# Patient Record
Sex: Male | Born: 1955
Health system: Southern US, Community
[De-identification: ages and names within clinical notes are randomized; demographics above are authoritative.]

## PROBLEM LIST (undated history)

## (undated) DIAGNOSIS — R911 Solitary pulmonary nodule: Secondary | ICD-10-CM

## (undated) DIAGNOSIS — Z7902 Long term (current) use of antithrombotics/antiplatelets: Secondary | ICD-10-CM

## (undated) DIAGNOSIS — R9431 Abnormal electrocardiogram [ECG] [EKG]: Secondary | ICD-10-CM

## (undated) DIAGNOSIS — K4020 Bilateral inguinal hernia, without obstruction or gangrene, not specified as recurrent: Secondary | ICD-10-CM

## (undated) DIAGNOSIS — R6510 Systemic inflammatory response syndrome (SIRS) of non-infectious origin without acute organ dysfunction: Secondary | ICD-10-CM

## (undated) DIAGNOSIS — Z7982 Long term (current) use of aspirin: Secondary | ICD-10-CM

## (undated) DIAGNOSIS — I499 Cardiac arrhythmia, unspecified: Secondary | ICD-10-CM

## (undated) DIAGNOSIS — D126 Benign neoplasm of colon, unspecified: Secondary | ICD-10-CM

## (undated) DIAGNOSIS — M1990 Unspecified osteoarthritis, unspecified site: Secondary | ICD-10-CM

## (undated) DIAGNOSIS — R0600 Dyspnea, unspecified: Secondary | ICD-10-CM

## (undated) DIAGNOSIS — Z87891 Personal history of nicotine dependence: Secondary | ICD-10-CM

## (undated) DIAGNOSIS — A419 Sepsis, unspecified organism: Secondary | ICD-10-CM

## (undated) DIAGNOSIS — K635 Polyp of colon: Secondary | ICD-10-CM

## (undated) DIAGNOSIS — J431 Panlobular emphysema: Secondary | ICD-10-CM

## (undated) DIAGNOSIS — K219 Gastro-esophageal reflux disease without esophagitis: Secondary | ICD-10-CM

## (undated) DIAGNOSIS — K645 Perianal venous thrombosis: Secondary | ICD-10-CM

## (undated) DIAGNOSIS — R05 Cough: Secondary | ICD-10-CM

## (undated) DIAGNOSIS — J42 Unspecified chronic bronchitis: Secondary | ICD-10-CM

## (undated) DIAGNOSIS — M170 Bilateral primary osteoarthritis of knee: Secondary | ICD-10-CM

## (undated) DIAGNOSIS — M778 Other enthesopathies, not elsewhere classified: Secondary | ICD-10-CM

## (undated) DIAGNOSIS — J45909 Unspecified asthma, uncomplicated: Secondary | ICD-10-CM

## (undated) DIAGNOSIS — R0609 Other forms of dyspnea: Secondary | ICD-10-CM

## (undated) DIAGNOSIS — Z955 Presence of coronary angioplasty implant and graft: Secondary | ICD-10-CM

## (undated) DIAGNOSIS — R7989 Other specified abnormal findings of blood chemistry: Secondary | ICD-10-CM

## (undated) DIAGNOSIS — Z8619 Personal history of other infectious and parasitic diseases: Secondary | ICD-10-CM

## (undated) DIAGNOSIS — M5030 Other cervical disc degeneration, unspecified cervical region: Secondary | ICD-10-CM

## (undated) DIAGNOSIS — Z720 Tobacco use: Secondary | ICD-10-CM

## (undated) DIAGNOSIS — R053 Chronic cough: Secondary | ICD-10-CM

## (undated) DIAGNOSIS — I10 Essential (primary) hypertension: Secondary | ICD-10-CM

## (undated) DIAGNOSIS — J449 Chronic obstructive pulmonary disease, unspecified: Secondary | ICD-10-CM

## (undated) DIAGNOSIS — E785 Hyperlipidemia, unspecified: Secondary | ICD-10-CM

## (undated) DIAGNOSIS — I2510 Atherosclerotic heart disease of native coronary artery without angina pectoris: Secondary | ICD-10-CM

## (undated) HISTORY — DX: Essential (primary) hypertension: I10

## (undated) HISTORY — DX: Systemic inflammatory response syndrome (sirs) of non-infectious origin without acute organ dysfunction: R65.10

## (undated) HISTORY — PX: HERNIA REPAIR: SHX51

## (undated) HISTORY — DX: Other enthesopathies, not elsewhere classified: M77.8

## (undated) HISTORY — DX: Abnormal electrocardiogram (ECG) (EKG): R94.31

## (undated) HISTORY — DX: Perianal venous thrombosis: K64.5

## (undated) HISTORY — PX: HEMORRHOID SURGERY: SHX153

## (undated) HISTORY — DX: Cough: R05

## (undated) HISTORY — DX: Atherosclerotic heart disease of native coronary artery without angina pectoris: I25.10

## (undated) HISTORY — DX: Personal history of other infectious and parasitic diseases: Z86.19

## (undated) HISTORY — DX: Chronic cough: R05.3

## (undated) HISTORY — DX: Other specified abnormal findings of blood chemistry: R79.89

---

## 1998-11-06 HISTORY — PX: INGUINAL HERNIA REPAIR: SHX194

## 1999-11-07 HISTORY — PX: INGUINAL HERNIA REPAIR: SHX194

## 2009-01-15 ENCOUNTER — Ambulatory Visit: Payer: Self-pay | Admitting: Family Medicine

## 2009-11-06 DIAGNOSIS — K645 Perianal venous thrombosis: Secondary | ICD-10-CM

## 2009-11-06 HISTORY — DX: Perianal venous thrombosis: K64.5

## 2010-02-24 DIAGNOSIS — K921 Melena: Secondary | ICD-10-CM | POA: Insufficient documentation

## 2010-05-16 DIAGNOSIS — K645 Perianal venous thrombosis: Secondary | ICD-10-CM

## 2010-05-16 HISTORY — DX: Perianal venous thrombosis: K64.5

## 2010-11-06 DIAGNOSIS — Z8614 Personal history of Methicillin resistant Staphylococcus aureus infection: Secondary | ICD-10-CM

## 2010-11-06 HISTORY — DX: Personal history of Methicillin resistant Staphylococcus aureus infection: Z86.14

## 2011-04-06 ENCOUNTER — Ambulatory Visit: Payer: Self-pay | Admitting: Family Medicine

## 2011-04-15 ENCOUNTER — Inpatient Hospital Stay (HOSPITAL_COMMUNITY)
Admission: EM | Admit: 2011-04-15 | Discharge: 2011-04-21 | DRG: 559 | Disposition: A | Discharge status: Home / Self Care | Payer: BC Managed Care – PPO | Attending: General Surgery | Admitting: General Surgery

## 2011-04-15 DIAGNOSIS — S2249XA Multiple fractures of ribs, unspecified side, initial encounter for closed fracture: Secondary | ICD-10-CM | POA: Diagnosis present

## 2011-04-15 DIAGNOSIS — Z23 Encounter for immunization: Secondary | ICD-10-CM

## 2011-04-15 DIAGNOSIS — F17200 Nicotine dependence, unspecified, uncomplicated: Secondary | ICD-10-CM | POA: Diagnosis present

## 2011-04-15 DIAGNOSIS — F1010 Alcohol abuse, uncomplicated: Secondary | ICD-10-CM | POA: Diagnosis present

## 2011-04-15 DIAGNOSIS — Y92838 Other recreation area as the place of occurrence of the external cause: Secondary | ICD-10-CM

## 2011-04-15 DIAGNOSIS — S272XXA Traumatic hemopneumothorax, initial encounter: Secondary | ICD-10-CM | POA: Diagnosis present

## 2011-04-15 DIAGNOSIS — Y9239 Other specified sports and athletic area as the place of occurrence of the external cause: Secondary | ICD-10-CM

## 2011-04-15 DIAGNOSIS — S42109A Fracture of unspecified part of scapula, unspecified shoulder, initial encounter for closed fracture: Secondary | ICD-10-CM | POA: Diagnosis present

## 2011-04-15 DIAGNOSIS — S42023A Displaced fracture of shaft of unspecified clavicle, initial encounter for closed fracture: Principal | ICD-10-CM | POA: Diagnosis present

## 2011-04-16 ENCOUNTER — Inpatient Hospital Stay (HOSPITAL_COMMUNITY): Payer: BC Managed Care – PPO

## 2011-04-16 ENCOUNTER — Encounter (HOSPITAL_COMMUNITY): Payer: Self-pay | Admitting: Radiology

## 2011-04-16 ENCOUNTER — Emergency Department (HOSPITAL_COMMUNITY): Payer: BC Managed Care – PPO

## 2011-04-16 DIAGNOSIS — T07XXXA Unspecified multiple injuries, initial encounter: Secondary | ICD-10-CM

## 2011-04-16 DIAGNOSIS — Z22322 Carrier or suspected carrier of Methicillin resistant Staphylococcus aureus: Secondary | ICD-10-CM

## 2011-04-16 HISTORY — DX: Unspecified multiple injuries, initial encounter: T07.XXXA

## 2011-04-16 HISTORY — DX: Carrier or suspected carrier of methicillin resistant Staphylococcus aureus: Z22.322

## 2011-04-16 LAB — CBC
HCT: 42.4 % (ref 39.0–52.0)
Hemoglobin: 14.5 g/dL (ref 13.0–17.0)
MCH: 30.4 pg (ref 26.0–34.0)
MCH: 31 pg (ref 26.0–34.0)
MCHC: 34.2 g/dL (ref 30.0–36.0)
MCV: 90 fL (ref 78.0–100.0)
MCV: 90.8 fL (ref 78.0–100.0)
Platelets: 154 10*3/uL (ref 150–400)
Platelets: 165 K/uL (ref 150–400)
RBC: 4.67 MIL/uL (ref 4.22–5.81)
RDW: 11.9 % (ref 11.5–15.5)
RDW: 11.9 % (ref 11.5–15.5)
WBC: 14.9 K/uL — ABNORMAL HIGH (ref 4.0–10.5)

## 2011-04-16 LAB — COMPREHENSIVE METABOLIC PANEL
ALT: 25 U/L (ref 0–53)
AST: 21 U/L (ref 0–37)
CO2: 22 mEq/L (ref 19–32)
Chloride: 100 mEq/L (ref 96–112)
Potassium: 3.7 mEq/L (ref 3.5–5.1)
Sodium: 136 mEq/L (ref 135–145)
Total Bilirubin: 0.2 mg/dL — ABNORMAL LOW (ref 0.3–1.2)

## 2011-04-16 LAB — COMPREHENSIVE METABOLIC PANEL WITH GFR
Albumin: 4.1 g/dL (ref 3.5–5.2)
Alkaline Phosphatase: 83 U/L (ref 39–117)
BUN: 14 mg/dL (ref 6–23)
Calcium: 8.8 mg/dL (ref 8.4–10.5)
Creatinine, Ser: 1.17 mg/dL (ref 0.4–1.5)
Glucose, Bld: 156 mg/dL — ABNORMAL HIGH (ref 70–99)
Total Protein: 7.5 g/dL (ref 6.0–8.3)

## 2011-04-16 LAB — LACTIC ACID, PLASMA: Lactic Acid, Venous: 2.2 mmol/L (ref 0.5–2.2)

## 2011-04-16 LAB — PROTIME-INR
INR: 1.04 (ref 0.00–1.49)
Prothrombin Time: 13.8 s (ref 11.6–15.2)

## 2011-04-16 LAB — SAMPLE TO BLOOD BANK

## 2011-04-16 LAB — POCT I-STAT, CHEM 8
BUN: 14 mg/dL (ref 6–23)
Calcium, Ion: 1.04 mmol/L — ABNORMAL LOW (ref 1.12–1.32)
Chloride: 106 meq/L (ref 96–112)
Glucose, Bld: 164 mg/dL — ABNORMAL HIGH (ref 70–99)
HCT: 46 % (ref 39.0–52.0)

## 2011-04-16 LAB — ETHANOL: Alcohol, Ethyl (B): <11 mg/dL — ABNORMAL HIGH (ref 0–10)

## 2011-04-16 MED ORDER — IOHEXOL 300 MG/ML  SOLN
100.0000 mL | Freq: Once | INTRAMUSCULAR | Status: AC | PRN
  Administered 2011-04-16: 100 mL via INTRAVENOUS

## 2011-04-17 ENCOUNTER — Inpatient Hospital Stay (HOSPITAL_COMMUNITY): Payer: BC Managed Care – PPO

## 2011-04-17 LAB — CBC
HCT: 39.8 % (ref 39.0–52.0)
Hemoglobin: 13.5 g/dL (ref 13.0–17.0)
MCH: 31.4 pg (ref 26.0–34.0)
MCHC: 33.9 g/dL (ref 30.0–36.0)
MCV: 92.6 fL (ref 78.0–100.0)
Platelets: 127 10*3/uL — ABNORMAL LOW (ref 150–400)
RBC: 4.3 MIL/uL (ref 4.22–5.81)
RDW: 12.2 % (ref 11.5–15.5)
WBC: 7.2 10*3/uL (ref 4.0–10.5)

## 2011-04-17 LAB — BASIC METABOLIC PANEL
BUN: 7 mg/dL (ref 6–23)
CO2: 33 mEq/L — ABNORMAL HIGH (ref 19–32)
Calcium: 8.1 mg/dL — ABNORMAL LOW (ref 8.4–10.5)
Chloride: 103 mEq/L (ref 96–112)
Creatinine, Ser: 0.75 mg/dL (ref 0.4–1.5)
GFR calc Af Amer: >60 mL/min (ref >60)
GFR calc non Af Amer: >60 mL/min (ref >60)
Glucose, Bld: 115 mg/dL — ABNORMAL HIGH (ref 70–99)
Potassium: 3.9 mEq/L (ref 3.5–5.1)
Sodium: 139 mEq/L (ref 135–145)

## 2011-04-18 ENCOUNTER — Inpatient Hospital Stay (HOSPITAL_COMMUNITY): Payer: BC Managed Care – PPO

## 2011-04-18 LAB — CBC
HCT: 42.1 % (ref 39.0–52.0)
MCV: 92.1 fL (ref 78.0–100.0)
Platelets: 123 10*3/uL — ABNORMAL LOW (ref 150–400)
RBC: 4.57 MIL/uL (ref 4.22–5.81)
RDW: 12.1 % (ref 11.5–15.5)
WBC: 6.7 10*3/uL (ref 4.0–10.5)

## 2011-04-19 ENCOUNTER — Inpatient Hospital Stay (HOSPITAL_COMMUNITY): Payer: BC Managed Care – PPO

## 2011-04-20 ENCOUNTER — Inpatient Hospital Stay (HOSPITAL_COMMUNITY): Payer: BC Managed Care – PPO

## 2011-04-20 LAB — SURGICAL PCR SCREEN: Staphylococcus aureus: POSITIVE — AB

## 2011-04-21 ENCOUNTER — Inpatient Hospital Stay (HOSPITAL_COMMUNITY): Payer: BC Managed Care – PPO

## 2011-04-21 HISTORY — PX: ORIF CLAVICLE FRACTURE: SUR924
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
## 2012-03-02 IMAGING — CR DG CHEST 1V PORT
1 series · 1 of 1 positions shown · non-contrast
Comparison: 1 day prior

CLINICAL DATA: Reevaluate pneumothorax.

PORTABLE CHEST - 1 VIEW

[view not recorded]
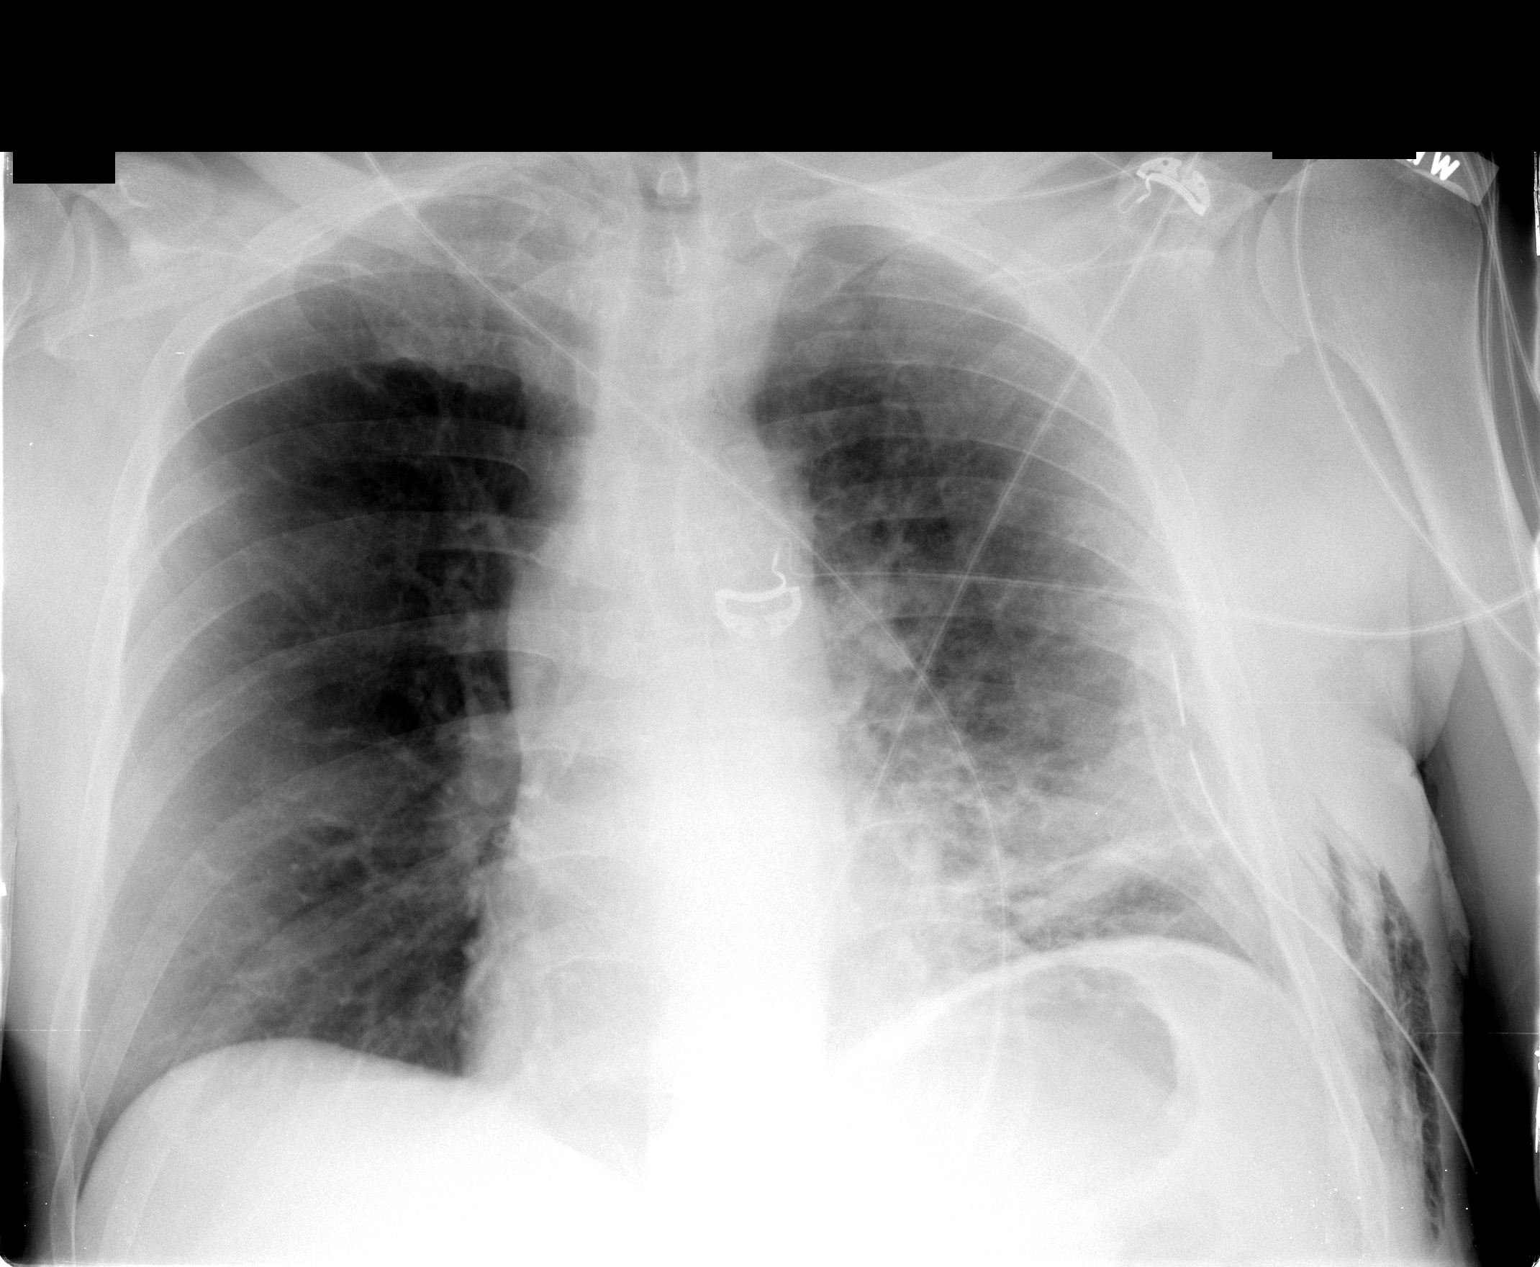

[1 of 1 positions shown; findings below may reference images not displayed]

FINDINGS: Left-sided chest tube is unchanged in position.  Slight
increase in subcutaneous emphysema about the left hemithorax.
Minimal increase in 5 - 10% left apical pneumothorax.

No pleural fluid.  Right lung clear.  Similar left base air space
disease.
IMPRESSION: 1.  Left-sided chest tube remaining in place with slight increase
in the 5 - 10% left apical pneumothorax.
2.  Persistent left base air space disease, either atelectasis or
contusion.

## 2012-03-04 IMAGING — CR DG CHEST 1V PORT
1 series · 1 of 1 positions shown · non-contrast
Comparison: 04/18/2011

CLINICAL DATA: Rib fracture/pneumothorax

PORTABLE CHEST - 1 VIEW

[view not recorded]
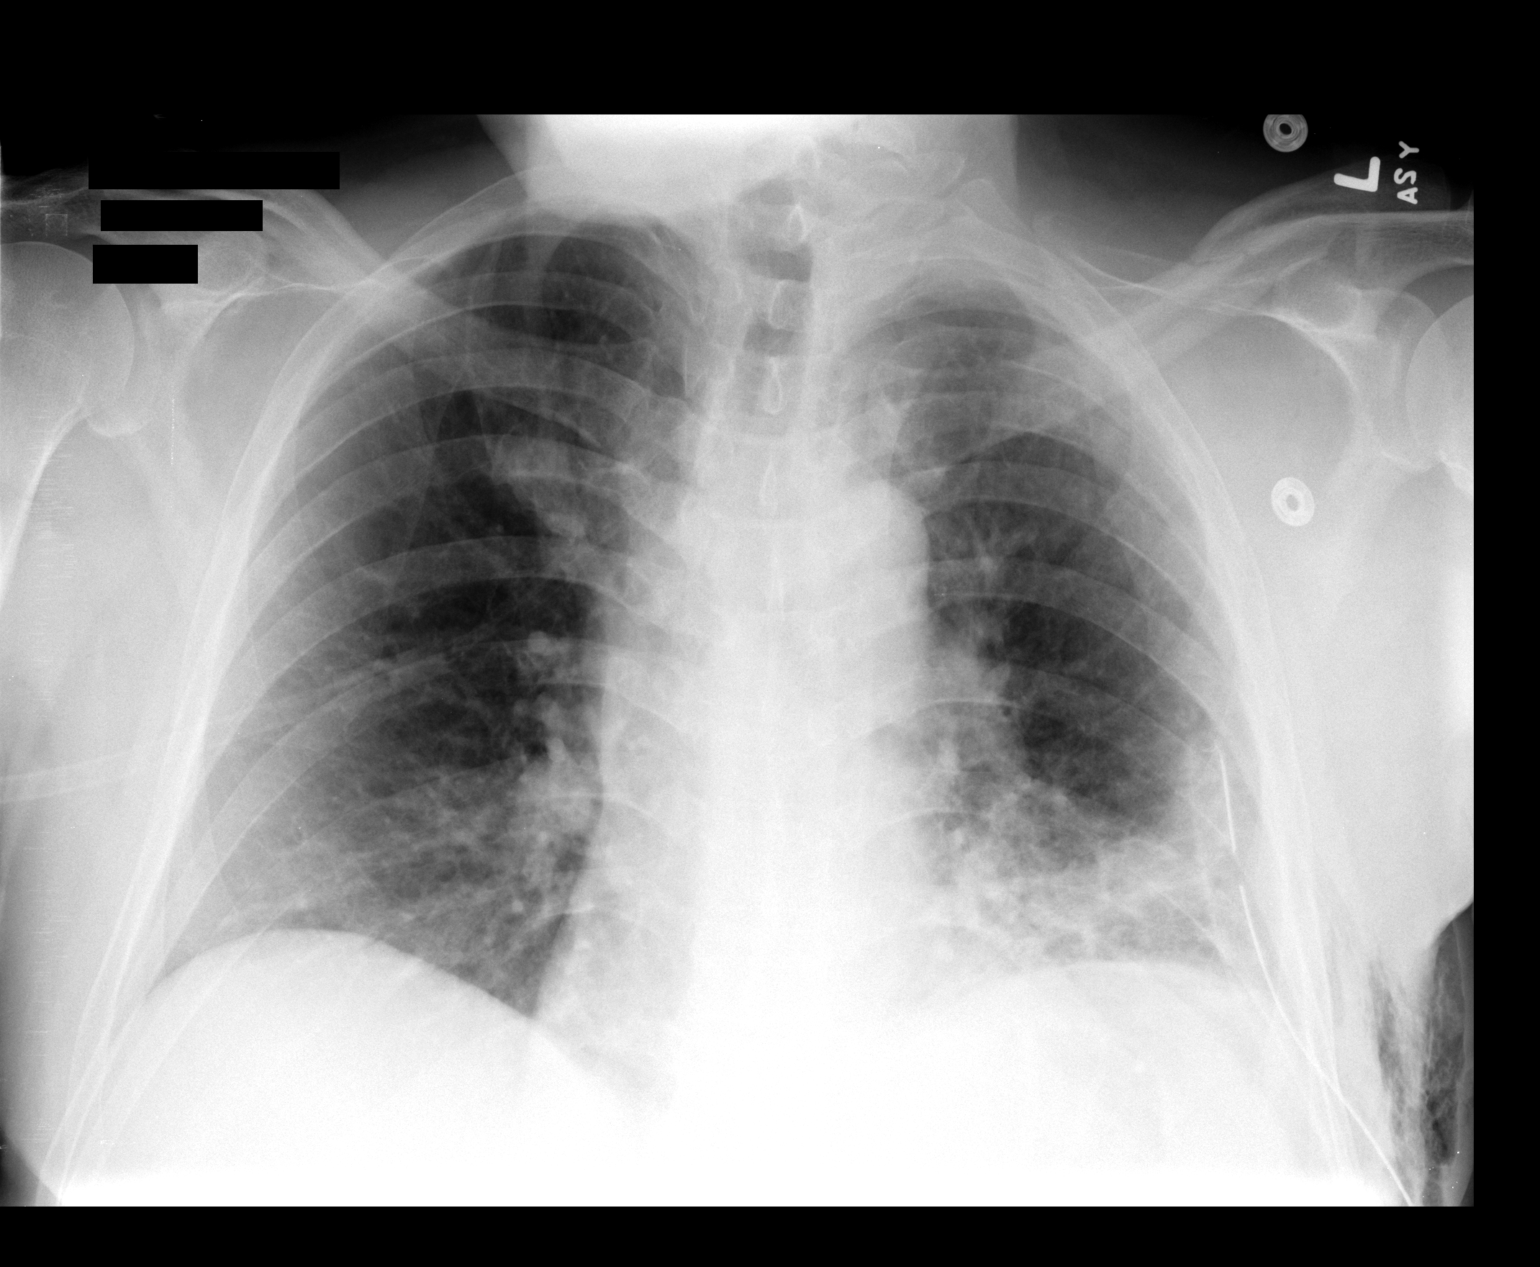

[1 of 1 positions shown; findings below may reference images not displayed]

FINDINGS: Left chest tube remains in place.  No definite
pneumothorax currently.  Subcutaneous emphysema has decreased.
Increased left lower lobe atelectasis.  Heart and mediastinum
remain normal.  Right lung clear.
IMPRESSION: 1.  Small left pneumothorax appears to have resolved. Subcutaneous
emphysema decreased.

2.  Increased left lower lobe atelectasis or contusion.

## 2012-03-05 IMAGING — CR DG CHEST 1V PORT
1 series · 1 of 1 positions shown · non-contrast
Comparison: 04/20/2011

CLINICAL DATA: Postop left clavicle repair.  Evaluate for
pneumothorax.

PORTABLE CHEST - 1 VIEW

[view not recorded]
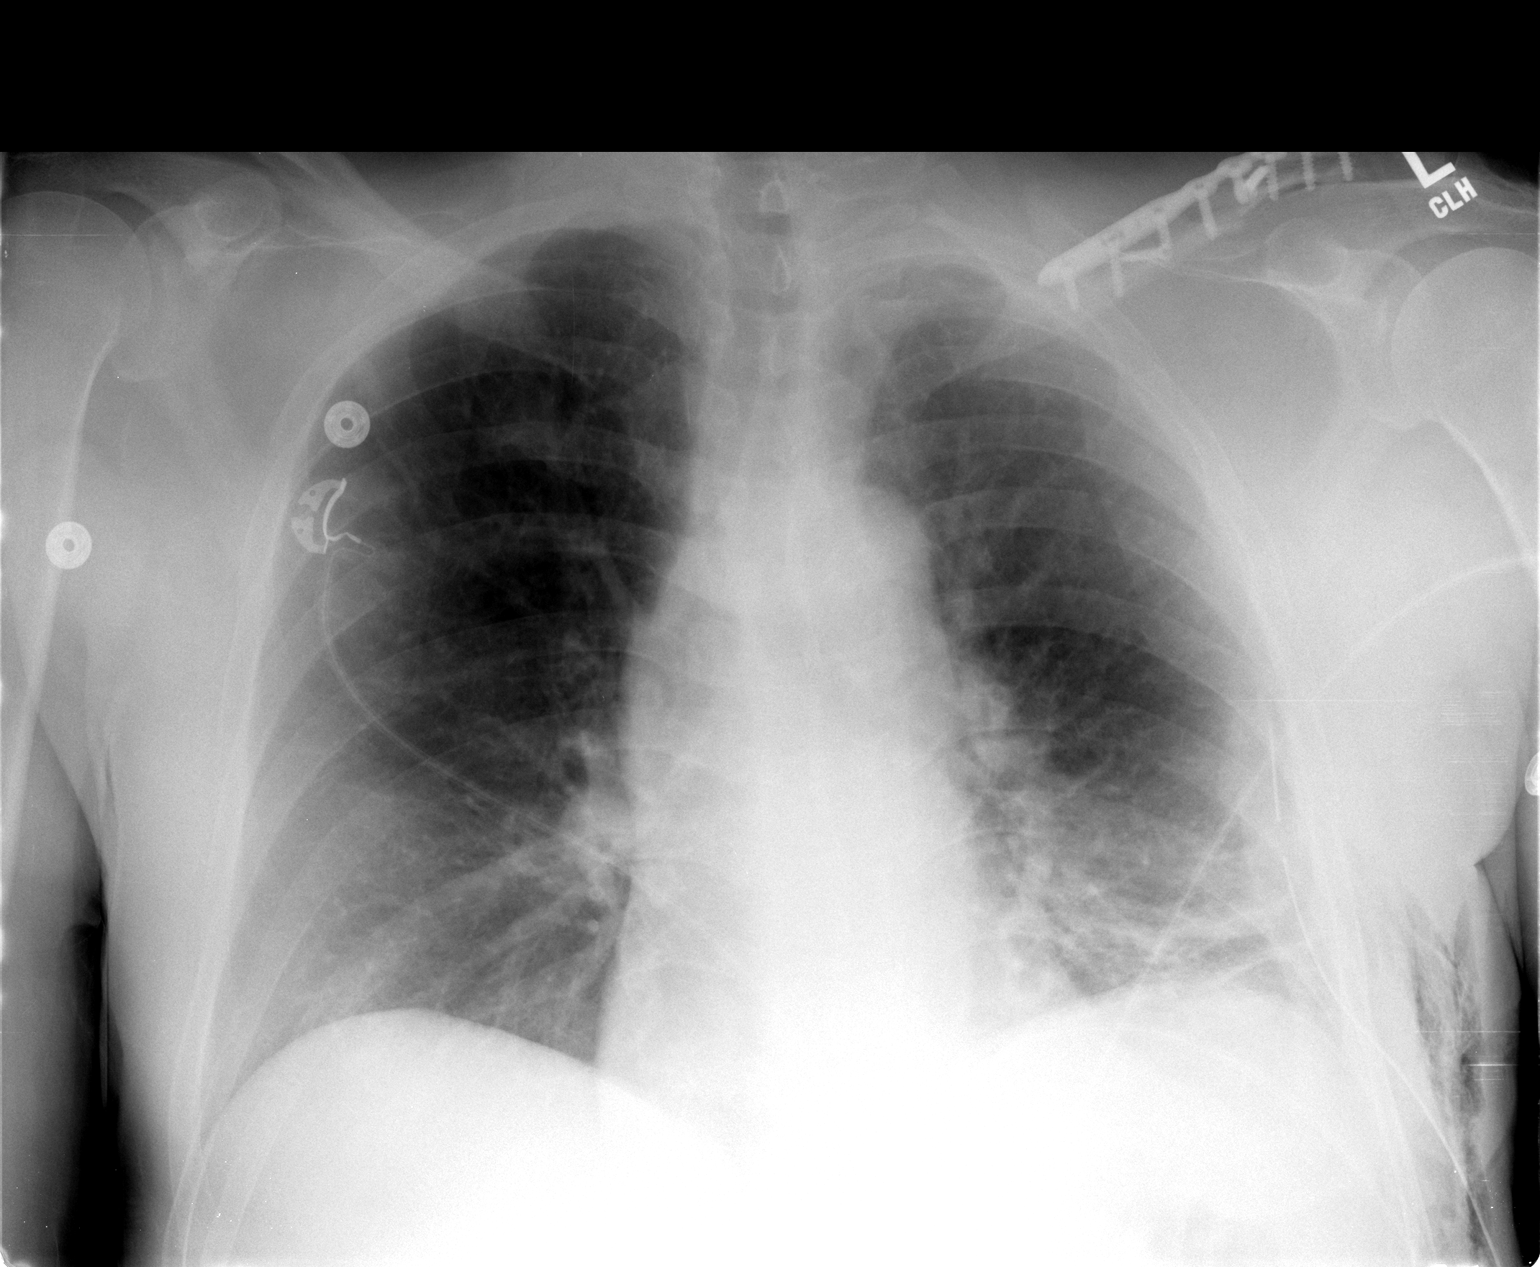

[1 of 1 positions shown; findings below may reference images not displayed]

FINDINGS: The patient has undergone left clavicle ORIF.  Left chest
tube has been placed.  No visible pneumothorax.  There is left
lower lobe atelectasis or contusion.  Left chest wall gas is
identified.
IMPRESSION: Status post left clavicle ORIF.  Left basilar atelectasis.  No
visible pneumothorax.

## 2012-03-06 IMAGING — CR DG CHEST 1V PORT
1 series · 1 of 1 positions shown · non-contrast
Comparison: 04/20/2011

CLINICAL DATA: Left-sided chest tube placement for pneumothorax

PORTABLE CHEST - 1 VIEW

[AP]
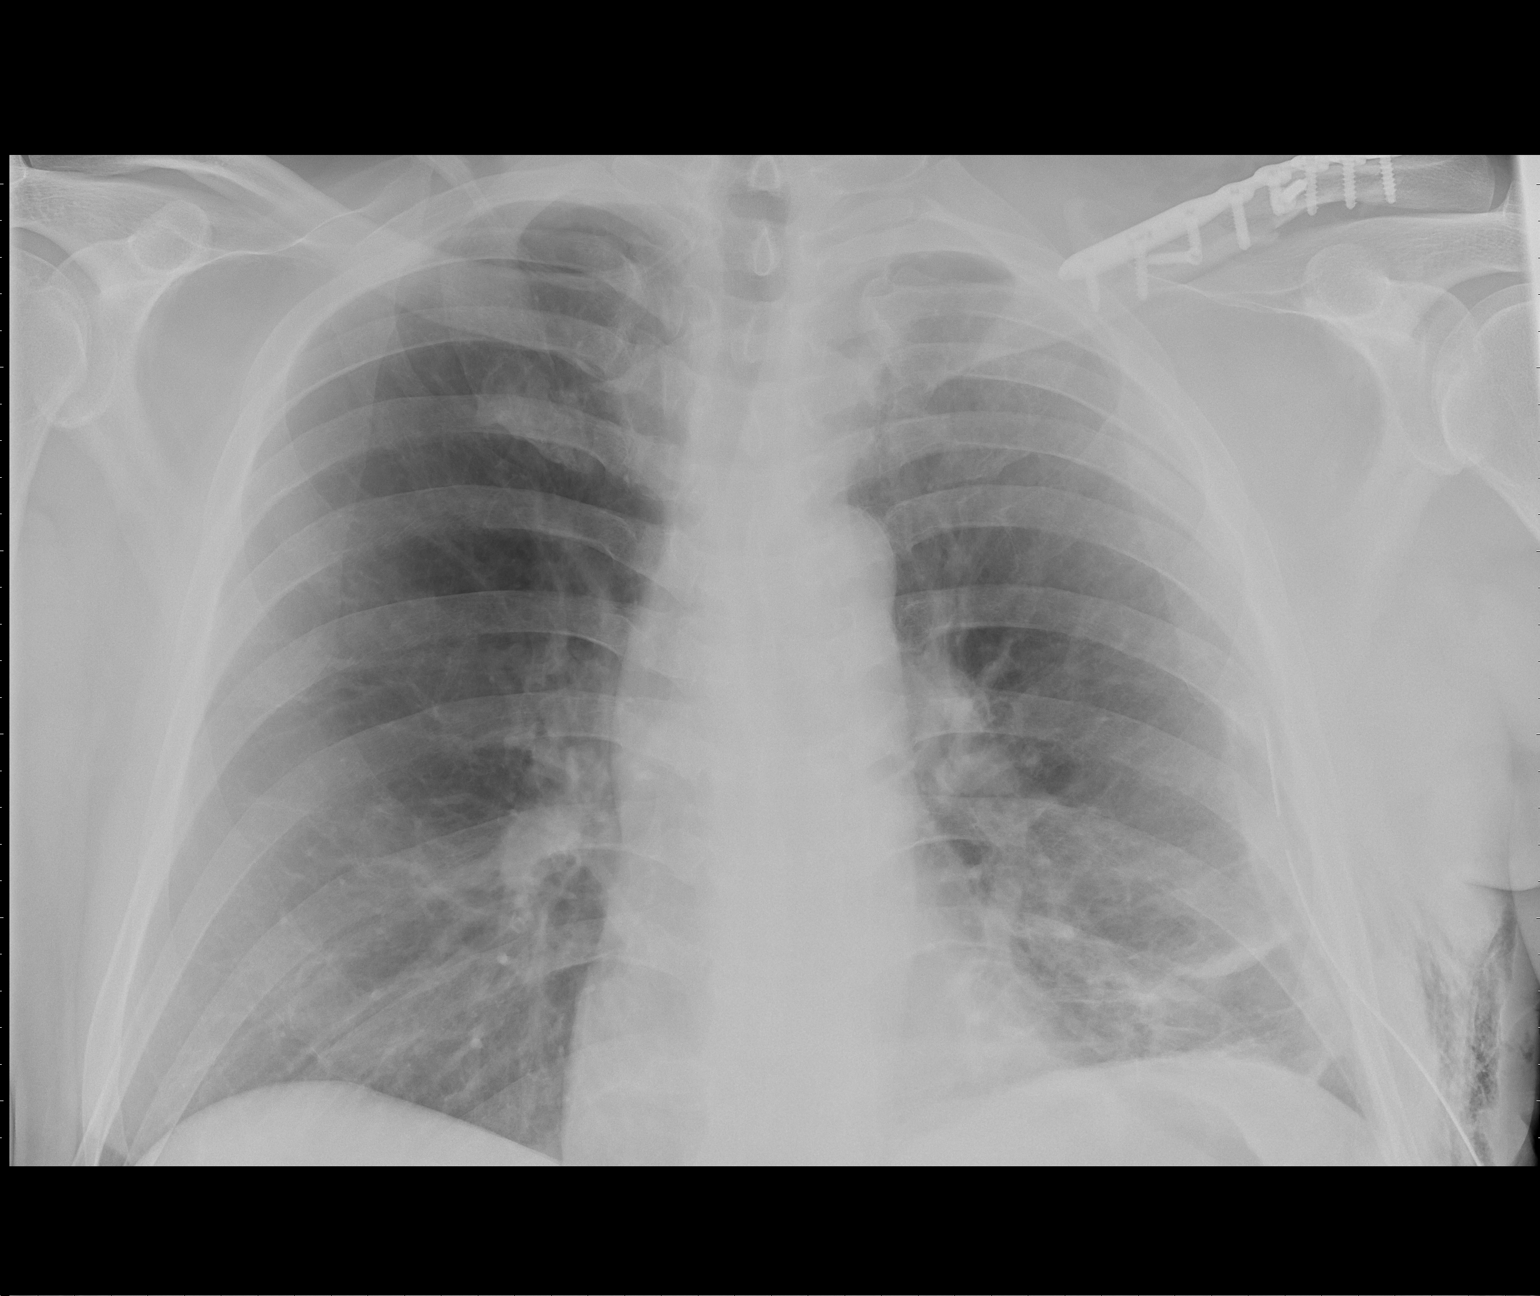

[1 of 1 positions shown; findings below may reference images not displayed]

FINDINGS: Left clavicular fixation hardware again noted. No
residual pneumothorax.  No mediastinal shift.  Left lower lobe
atelectasis is noted.  Right lung is clear.  Heart size is normal.
IMPRESSION: No visible residual pneumothorax with chest tube remaining in place
on the left.

Persistent incomplete reexpansion of the left lung.

## 2012-09-24 ENCOUNTER — Ambulatory Visit: Payer: Self-pay | Admitting: Family Medicine

## 2014-09-04 ENCOUNTER — Ambulatory Visit: Payer: Self-pay | Admitting: Family Medicine

## 2014-09-11 DIAGNOSIS — R9431 Abnormal electrocardiogram [ECG] [EKG]: Secondary | ICD-10-CM | POA: Insufficient documentation

## 2014-09-11 DIAGNOSIS — I10 Essential (primary) hypertension: Secondary | ICD-10-CM | POA: Insufficient documentation

## 2014-09-11 DIAGNOSIS — Z720 Tobacco use: Secondary | ICD-10-CM | POA: Insufficient documentation

## 2014-09-11 DIAGNOSIS — R0681 Apnea, not elsewhere classified: Secondary | ICD-10-CM | POA: Insufficient documentation

## 2014-09-11 DIAGNOSIS — R079 Chest pain, unspecified: Secondary | ICD-10-CM | POA: Insufficient documentation

## 2014-09-11 DIAGNOSIS — E782 Mixed hyperlipidemia: Secondary | ICD-10-CM | POA: Insufficient documentation

## 2014-09-11 DIAGNOSIS — R5383 Other fatigue: Secondary | ICD-10-CM | POA: Insufficient documentation

## 2014-11-06 DIAGNOSIS — J418 Mixed simple and mucopurulent chronic bronchitis: Secondary | ICD-10-CM

## 2014-11-06 DIAGNOSIS — M7710 Lateral epicondylitis, unspecified elbow: Secondary | ICD-10-CM

## 2014-11-06 DIAGNOSIS — R9431 Abnormal electrocardiogram [ECG] [EKG]: Secondary | ICD-10-CM

## 2014-11-06 DIAGNOSIS — M659 Unspecified synovitis and tenosynovitis, unspecified site: Secondary | ICD-10-CM

## 2014-11-06 HISTORY — DX: Lateral epicondylitis, unspecified elbow: M77.10

## 2014-11-06 HISTORY — DX: Mixed simple and mucopurulent chronic bronchitis: J41.8

## 2014-11-06 HISTORY — DX: Abnormal electrocardiogram (ECG) (EKG): R94.31

## 2014-11-06 HISTORY — DX: Unspecified synovitis and tenosynovitis, unspecified site: M65.90

## 2014-12-16 DIAGNOSIS — I499 Cardiac arrhythmia, unspecified: Secondary | ICD-10-CM | POA: Insufficient documentation

## 2015-05-25 ENCOUNTER — Ambulatory Visit (INDEPENDENT_AMBULATORY_CARE_PROVIDER_SITE_OTHER): Payer: BLUE CROSS/BLUE SHIELD | Admitting: Family Medicine

## 2015-05-25 ENCOUNTER — Encounter: Payer: Self-pay | Admitting: Family Medicine

## 2015-05-25 VITALS — BP 138/84 | HR 70 | Temp 98.4°F | Resp 16 | Wt 185.4 lb

## 2015-05-25 DIAGNOSIS — M659 Synovitis and tenosynovitis, unspecified: Secondary | ICD-10-CM | POA: Insufficient documentation

## 2015-05-25 DIAGNOSIS — R05 Cough: Secondary | ICD-10-CM | POA: Insufficient documentation

## 2015-05-25 DIAGNOSIS — R9431 Abnormal electrocardiogram [ECG] [EKG]: Secondary | ICD-10-CM | POA: Insufficient documentation

## 2015-05-25 DIAGNOSIS — M7710 Lateral epicondylitis, unspecified elbow: Secondary | ICD-10-CM | POA: Insufficient documentation

## 2015-05-25 DIAGNOSIS — M7712 Lateral epicondylitis, left elbow: Secondary | ICD-10-CM | POA: Diagnosis not present

## 2015-05-25 DIAGNOSIS — R053 Chronic cough: Secondary | ICD-10-CM | POA: Insufficient documentation

## 2015-05-25 DIAGNOSIS — R0683 Snoring: Secondary | ICD-10-CM | POA: Insufficient documentation

## 2015-05-25 MED ORDER — LIDOCAINE HCL (PF) 1 % IJ SOLN
1.0000 mL | Freq: Once | INTRAMUSCULAR | Status: AC
  Administered 2015-05-25: 1 mL via INTRADERMAL

## 2015-05-25 MED ORDER — METHYLPREDNISOLONE ACETATE 80 MG/ML IJ SUSP
80.0000 mg | Freq: Once | INTRAMUSCULAR | Status: AC
  Administered 2015-05-25: 80 mg via INTRAMUSCULAR

## 2015-05-25 NOTE — Progress Notes (Signed)
Patient ID: Joe Dillon, male   DOB: 1956-04-03, 59 y.o.   MRN: [id number]       Patient: Joe Dillon Male    DOB: Jan 28, 1956   59 y.o.   MRN: [id number] Visit Date: 05/25/2015  Today's Provider: Vernie Murders, PA   Chief Complaint  Patient presents with  . Elbow Pain    left elbow, follow-up, was seen in May 16th, 2016 for same left elbow pain.   Subjective:    HPI  This 59 year old male has had recurrence of left lateral epicondylitis recently. Still using an elbow strap with slight relief and increase in ability to lift with the left arm. Used Meloxicam at initial visit 03-22-15 and did not get much relief. Still having ache, throb and sharp pains with gripping a weedeater or turning screw driver/using a hammer. Some help with moist heat or ice pack applications.   No past medical history on file. Patient Active Problem List   Diagnosis Date Noted  . Chronic cough 05/25/2015  . Backhand tennis elbow 05/25/2015  . Snores 05/25/2015  . ST segment depression 05/25/2015  . Synovitis and tenosynovitis 05/25/2015  . Tenosynovitis of hand 05/25/2015  . Irregular cardiac rhythm 12/16/2014  . Abnormal ECG 09/11/2014  . Benign essential HTN 09/11/2014  . Chest pain 09/11/2014  . Fatigue 09/11/2014  . Combined fat and carbohydrate induced hyperlipemia 09/11/2014  . Breathlessness on exertion 09/11/2014  . Thrombosed hemorrhoids 05/16/2010  . Blood in feces 02/24/2010   No past surgical history on file. History  Substance Use Topics  . Smoking status: Current Every Day Smoker -- 1.00 packs/day for 39 years    Types: Cigarettes  . Smokeless tobacco: Current User    Types: Chew  . Alcohol Use: 0.0 oz/week    0 Standard drinks or equivalent per week     Comment: occaionally   No family history on file.   No Known Allergies Previous Medications   ACETAMINOPHEN (TYLENOL) 325 MG TABLET    Take 650 mg by mouth every 6 (six) hours as needed.   BUDESONIDE-FORMOTEROL  (SYMBICORT) 160-4.5 MCG/ACT INHALER    Inhale into the lungs.   IBUPROFEN (ADVIL,MOTRIN) 200 MG TABLET    Take 200 mg by mouth every 6 (six) hours as needed.   Review of Systems  Constitutional: Negative.   HENT: Negative.   Respiratory: Negative.   Cardiovascular: Negative.   Gastrointestinal: Negative.   Musculoskeletal: Positive for arthralgias.       Left elbow pain - worse with using weed eater yesterday  Neurological: Negative.    Objective:   BP 138/84 mmHg  Pulse 70  Temp(Src) 98.4 F (36.9 C) (Oral)  Resp 16  Wt 185 lb 6.4 oz (84.097 kg)  Physical Exam  Constitutional: He is oriented to person, place, and time. He appears well-developed and well-nourished. No distress.  HENT:  Head: Normocephalic and atraumatic.  Right Ear: Hearing normal.  Left Ear: Hearing normal.  Nose: Nose normal.  Eyes: Conjunctivae and lids are normal. Right eye exhibits no discharge. Left eye exhibits no discharge. No scleral icterus.  Pulmonary/Chest: Effort normal. No respiratory distress.  Musculoskeletal: Normal range of motion. He exhibits tenderness.  Left elbow over the lateral epicondyle.  Neurological: He is alert and oriented to person, place, and time.  Skin: Skin is intact. No lesion and no rash noted.  Psychiatric: He has a normal mood and affect. His speech is normal and behavior is normal. Thought content normal.  Assessment & Plan:     1. Lateral epicondylitis of left elbow Recurrence and worsening pain in left lateral elbow. Tender to touch or test grip strength with elbow extended. No relief from NSAID use. Will give Depo-Medrol tendon injection and apply Tennis Elbow strap. Limit strenuous activities requiring use of left arm. Recheck in 7-10 days if no better. - lidocaine (PF) (XYLOCAINE) 1 % injection 1 mL; Inject 1 mL into the skin once. - methylPREDNISolone acetate (DEPO-MEDROL) injection 80 mg; Inject 1 mL (80 mg total) into the muscle once.         Vernie Murders, PA  [facility]

## 2015-09-06 ENCOUNTER — Other Ambulatory Visit: Payer: Self-pay | Admitting: Family Medicine

## 2015-09-06 NOTE — Telephone Encounter (Signed)
Pt's wife Jackelyn Poling is requesting a refill for Symbicort sent to [facility]. [name] asked if 2 could be sent in b[place]c [place]. Thanks TNP

## 2015-09-07 MED ORDER — BUDESONIDE-FORMOTEROL FUMARATE 160-4.5 MCG/ACT IN AERO: 2.0000 | INHALATION_SPRAY | Freq: Two times a day (BID) | RESPIRATORY_TRACT | Status: DC

## 2015-09-07 NOTE — Telephone Encounter (Signed)
RX sent to pharmacy. Patient's wife advised RX has been sent to the pharmacy.

## 2015-09-07 NOTE — Telephone Encounter (Signed)
Pt's wife called to see if the RX had been sent in. I advise refill request can take 24 to 48 hours. Thanks TNP

## 2015-10-14 ENCOUNTER — Ambulatory Visit
Admission: RE | Admit: 2015-10-14 | Discharge: 2015-10-14 | Disposition: A | Discharge status: Home / Self Care | Payer: BLUE CROSS/BLUE SHIELD | Source: Ambulatory Visit | Attending: Physician Assistant | Admitting: Physician Assistant

## 2015-10-14 ENCOUNTER — Ambulatory Visit (INDEPENDENT_AMBULATORY_CARE_PROVIDER_SITE_OTHER): Payer: BLUE CROSS/BLUE SHIELD | Admitting: Physician Assistant

## 2015-10-14 ENCOUNTER — Encounter: Payer: Self-pay | Admitting: Physician Assistant

## 2015-10-14 VITALS — BP 160/100 | HR 80 | Temp 98.2°F | Resp 16 | Wt 186.4 lb

## 2015-10-14 DIAGNOSIS — R059 Cough, unspecified: Secondary | ICD-10-CM

## 2015-10-14 DIAGNOSIS — R05 Cough: Secondary | ICD-10-CM | POA: Insufficient documentation

## 2015-10-14 DIAGNOSIS — J40 Bronchitis, not specified as acute or chronic: Secondary | ICD-10-CM | POA: Diagnosis not present

## 2015-10-14 DIAGNOSIS — R0989 Other specified symptoms and signs involving the circulatory and respiratory systems: Secondary | ICD-10-CM

## 2015-10-14 DIAGNOSIS — J449 Chronic obstructive pulmonary disease, unspecified: Secondary | ICD-10-CM | POA: Insufficient documentation

## 2015-10-14 DIAGNOSIS — J45909 Unspecified asthma, uncomplicated: Secondary | ICD-10-CM | POA: Diagnosis not present

## 2015-10-14 MED ORDER — BUDESONIDE-FORMOTEROL FUMARATE 160-4.5 MCG/ACT IN AERO: 2.0000 | INHALATION_SPRAY | Freq: Two times a day (BID) | RESPIRATORY_TRACT | Status: DC

## 2015-10-14 MED ORDER — BENZONATATE 200 MG PO CAPS: 200.0000 mg | ORAL_CAPSULE | Freq: Three times a day (TID) | ORAL | Status: DC | PRN

## 2015-10-14 MED ORDER — LEVOFLOXACIN 500 MG PO TABS: 500.0000 mg | ORAL_TABLET | Freq: Every day | ORAL | Status: DC

## 2015-10-14 NOTE — Progress Notes (Signed)
Patient: Joe Dillon Male    DOB: Sep 01, 1956   59 y.o.   MRN: [id number] Visit Date: 10/14/2015  Today's Provider: Mar Daring, PA-C   Chief Complaint  Patient presents with  . URI   Subjective:    URI  This is a new problem. The current episode started 1 to 4 weeks ago. The problem has been unchanged. There has been no fever. Associated symptoms include congestion, coughing, rhinorrhea, sinus pain, sneezing and wheezing. Pertinent negatives include no ear pain, headaches or sore throat. He has tried decongestant and acetaminophen (Dayquil) for the symptoms. The treatment provided mild relief.  Symptoms started last Thursday. He states that he has had increased cough and is now coughing up a thick green mucus. He has had increased shortness of breath and wheezing. He states that he has been able to sleep throughout the night but when he wakes up in the morning the cough is more persistent. He states as he goes through the day the cough lessens. He also states that the DayQuil has helped some. He denies any fevers, chills, nausea or vomiting. He is a smoker. He states that he does have a chronic cough for which she uses Symbicort. He states that he normally uses his Symbicort once every 3-4 days.  His blood pressure is elevated today in the office. He denies any chest pain, shortness of breath, increased dyspnea on exertion, orthopnea or edema. He also denies visual changes or headache. He states he has never had to take blood pressure medication.     No Known Allergies Previous Medications   ACETAMINOPHEN (TYLENOL) 325 MG TABLET    Take 650 mg by mouth every 6 (six) hours as needed.   BUDESONIDE-FORMOTEROL (SYMBICORT) 160-4.5 MCG/ACT INHALER    Inhale 2 puffs into the lungs 2 (two) times daily.   IBUPROFEN (ADVIL,MOTRIN) 200 MG TABLET    Take 200 mg by mouth every 6 (six) hours as needed.    Review of Systems  Constitutional: Positive for fever. Negative for chills.    HENT: Positive for congestion, postnasal drip, rhinorrhea and sneezing. Negative for ear pain, sinus pressure, sore throat, tinnitus, trouble swallowing and voice change.   Eyes: Positive for discharge.  Respiratory: Positive for cough and wheezing. Negative for chest tightness and shortness of breath.   Cardiovascular: Negative.   Gastrointestinal: Negative.   Neurological: Negative for dizziness, weakness and headaches.  All other systems reviewed and are negative.   Social History  Substance Use Topics  . Smoking status: Current Every Day Smoker -- 1.00 packs/day for 39 years    Types: Cigarettes  . Smokeless tobacco: Current User    Types: Chew  . Alcohol Use: 0.0 oz/week    0 Standard drinks or equivalent per week     Comment: occaionally   Objective:   BP 160/100 mmHg  Pulse 80  Temp(Src) 98.2 F (36.8 C) (Oral)  Resp 16  Wt 186 lb 6.4 oz (84.55 kg)  SpO2 95%  Physical Exam  Constitutional: He appears well-developed and well-nourished. No distress.  HENT:  Head: Normocephalic and atraumatic.  Right Ear: Hearing, external ear and ear canal normal. A middle ear effusion is present.  Left Ear: Hearing, external ear and ear canal normal. A middle ear effusion is present.  Nose: Mucosal edema and rhinorrhea present. Right sinus exhibits no maxillary sinus tenderness and no frontal sinus tenderness. Left sinus exhibits no maxillary sinus tenderness and no frontal sinus tenderness.  Mouth/Throat: Uvula is midline, oropharynx is clear and moist and mucous membranes are normal. No oropharyngeal exudate.  Eyes: Conjunctivae and EOM are normal. Pupils are equal, round, and reactive to light. Right eye exhibits no discharge. Left eye exhibits no discharge.  Neck: Normal range of motion. Neck supple. No JVD present. No tracheal deviation present. No Brudzinski's sign and no Kernig's sign noted. No thyromegaly present.    Cardiovascular: Normal rate, regular rhythm and normal heart  sounds.  Exam reveals no gallop and no friction rub.   No murmur heard. Pulmonary/Chest: Effort normal. No stridor. No respiratory distress. He has no decreased breath sounds. He has wheezes (Prolonged expiration with expiratory wheezes heard throughout). He has no rhonchi. He has rales in the right lower field. He exhibits no tenderness.  Lymphadenopathy:    He has no cervical adenopathy.  Skin: Skin is warm and dry. He is not diaphoretic.        Assessment & Plan:     1. Bronchitis I advised him to increase his Symbicort use to daily use for the time being. I will also prescribe Levaquin as below for the increased bronchitis and the abnormal lung sounds. I will also get a chest x-ray to rule out pneumonia. I have also given him Tessalon Perles for his daytime cough. He is to call if his cough worsens and starts keeping him awake at night. He is to continue to try to rest and to stay well-hydrated. I did advise him to call the office if his symptoms worsen. I did advise him to discontinue use of DayQuil and to switch to Coricidin HBP if necessary for decongestant. I do feel that the pseudoephedrine intake will is contributing to his elevated blood pressure today in the office. I also advised him to go to the hospital or call 911 if he develops any increased shortness of breath, chest pain, weakness or paralysis on one side or the other, visual changes, severe headache with nausea or vomiting or any other signs and symptoms worrisome of heart attack or stroke. I will see him back in 2 weeks to reevaluate his lung sounds to see if he is improving as well as to recheck his blood pressure as it was elevated today in the office. - levofloxacin (LEVAQUIN) 500 MG tablet; Take 1 tablet (500 mg total) by mouth daily.  Dispense: 10 tablet; Refill: 0 - budesonide-formoterol (SYMBICORT) 160-4.5 MCG/ACT inhaler; Inhale 2 puffs into the lungs 2 (two) times daily.  Dispense: 2 Inhaler; Refill: 1  2. Cough See  above medical treatment plan. - DG Chest 2 View; Future - benzonatate (TESSALON) 200 MG capsule; Take 1 capsule (200 mg total) by mouth 3 (three) times daily as needed for cough.  Dispense: 30 capsule; Refill: 0 - budesonide-formoterol (SYMBICORT) 160-4.5 MCG/ACT inhaler; Inhale 2 puffs into the lungs 2 (two) times daily.  Dispense: 2 Inhaler; Refill: 1  3. Chest rales See above medical treatment plan. - DG Chest 2 View; Future - levofloxacin (LEVAQUIN) 500 MG tablet; Take 1 tablet (500 mg total) by mouth daily.  Dispense: 10 tablet; Refill: 0       Mar Daring, PA-C  [facility]

## 2015-10-14 NOTE — Patient Instructions (Signed)

## 2015-10-15 ENCOUNTER — Telehealth: Payer: Self-pay

## 2015-10-15 NOTE — Telephone Encounter (Signed)
Patient advised as directed below.  Thanks,  -Joseline 

## 2015-10-15 NOTE — Telephone Encounter (Signed)
-----   Message from Mar Daring, [place] sent at 10/15/2015  9:13 AM EST ----- CXR shows no pneumonia, but does show active COPD with acute exacerbation.  Continue antibiotic and f/u in 2 weeks if wanted as we discussed in the office visit.  Thanks.

## 2015-10-28 ENCOUNTER — Ambulatory Visit (INDEPENDENT_AMBULATORY_CARE_PROVIDER_SITE_OTHER): Payer: BLUE CROSS/BLUE SHIELD | Admitting: Physician Assistant

## 2015-10-28 ENCOUNTER — Encounter: Payer: Self-pay | Admitting: Physician Assistant

## 2015-10-28 VITALS — BP 148/98 | HR 80 | Temp 98.3°F | Resp 16 | Wt 185.0 lb

## 2015-10-28 DIAGNOSIS — J418 Mixed simple and mucopurulent chronic bronchitis: Secondary | ICD-10-CM | POA: Insufficient documentation

## 2015-10-28 DIAGNOSIS — I10 Essential (primary) hypertension: Secondary | ICD-10-CM

## 2015-10-28 MED ORDER — HYDROCHLOROTHIAZIDE 25 MG PO TABS: 25.0000 mg | ORAL_TABLET | Freq: Every day | ORAL | Status: DC

## 2015-10-28 NOTE — Patient Instructions (Signed)
Hypertension Hypertension, commonly called high blood pressure, is when the force of blood pumping through your arteries is too strong. Your arteries are the blood vessels that carry blood from your heart throughout your body. A blood pressure reading consists of a higher number over a lower number, such as 110/72. The higher number (systolic) is the pressure inside your arteries when your heart pumps. The lower number (diastolic) is the pressure inside your arteries when your heart relaxes. Ideally you want your blood pressure below 120/80. Hypertension forces your heart to work harder to pump blood. Your arteries may become narrow or stiff. Having untreated or uncontrolled hypertension can cause heart attack, stroke, kidney disease, and other problems. RISK FACTORS Some risk factors for high blood pressure are controllable. Others are not.  Risk factors you cannot control include:   Race. You may be at higher risk if you are African American.  Age. Risk increases with age.  Gender. Men are at higher risk than women before age 45 years. After age 65, women are at higher risk than men. Risk factors you can control include:  Not getting enough exercise or physical activity.  Being overweight.  Getting too much fat, sugar, calories, or salt in your diet.  Drinking too much alcohol. SIGNS AND SYMPTOMS Hypertension does not usually cause signs or symptoms. Extremely high blood pressure (hypertensive crisis) may cause headache, anxiety, shortness of breath, and nosebleed. DIAGNOSIS To check if you have hypertension, your health care provider will measure your blood pressure while you are seated, with your arm held at the level of your heart. It should be measured at least twice using the same arm. Certain conditions can cause a difference in blood pressure between your right and left arms. A blood pressure reading that is higher than normal on one occasion does not mean that you need treatment. If  it is not clear whether you have high blood pressure, you may be asked to return on a different day to have your blood pressure checked again. Or, you may be asked to monitor your blood pressure at home for 1 or more weeks. TREATMENT Treating high blood pressure includes making lifestyle changes and possibly taking medicine. Living a healthy lifestyle can help lower high blood pressure. You may need to change some of your habits. Lifestyle changes may include:  Following the DASH diet. This diet is high in fruits, vegetables, and whole grains. It is low in salt, red meat, and added sugars.  Keep your sodium intake below 2,300 mg per day.  Getting at least 30-45 minutes of aerobic exercise at least 4 times per week.  Losing weight if necessary.  Not smoking.  Limiting alcoholic beverages.  Learning ways to reduce stress. Your health care provider may prescribe medicine if lifestyle changes are not enough to get your blood pressure under control, and if one of the following is true:  You are 18-59 years of age and your systolic blood pressure is above 140.  You are 60 years of age or older, and your systolic blood pressure is above 150.  Your diastolic blood pressure is above 90.  You have diabetes, and your systolic blood pressure is over 140 or your diastolic blood pressure is over 90.  You have kidney disease and your blood pressure is above 140/90.  You have heart disease and your blood pressure is above 140/90. Your personal target blood pressure may vary depending on your medical conditions, your age, and other factors. HOME CARE INSTRUCTIONS    Have your blood pressure rechecked as directed by your health care provider.   Take medicines only as directed by your health care provider. Follow the directions carefully. Blood pressure medicines must be taken as prescribed. The medicine does not work as well when you skip doses. Skipping doses also puts you at risk for  problems.  Do not smoke.   Monitor your blood pressure at home as directed by your health care provider. SEEK MEDICAL CARE IF:   You think you are having a reaction to medicines taken.  You have recurrent headaches or feel dizzy.  You have swelling in your ankles.  You have trouble with your vision. SEEK IMMEDIATE MEDICAL CARE IF:  You develop a severe headache or confusion.  You have unusual weakness, numbness, or feel faint.  You have severe chest or abdominal pain.  You vomit repeatedly.  You have trouble breathing. MAKE SURE YOU:   Understand these instructions.  Will watch your condition.  Will get help right away if you are not doing well or get worse.   This information is not intended to replace advice given to you by your health care provider. Make sure you discuss any questions you have with your health care provider.   Document Released: 10/23/2005 Document Revised: 03/09/2015 Document Reviewed: 08/15/2013 Elsevier Interactive Patient Education 2016 Elsevier Inc.  

## 2015-10-28 NOTE — Progress Notes (Signed)
Patient: Joe Dillon Male    DOB: Apr 05, 1956   59 y.o.   MRN: [id number] Visit Date: 10/28/2015  Today's Provider: Mar Daring, PA-C   Chief Complaint  Patient presents with  . Follow-up    Elevated blood pressure, Chest Rales   Subjective:    HPI  Hypertension, follow-up:  BP Readings from Last 3 Encounters:  10/28/15 148/98  10/14/15 160/100  05/25/15 138/84    He was last seen for hypertension 2 weeks ago.  BP at that visit was 160/100. Management since that visit includes none  He is not exercising. Just at work, patient stays very active. He is not adherent to low salt diet.   Outside blood pressures are, patient has not check blood pressure. He is experiencing none.  .     Weight trend: stable Wt Readings from Last 3 Encounters:  10/28/15 185 lb (83.915 kg)  10/14/15 186 lb 6.4 oz (84.55 kg)  05/25/15 185 lb 6.4 oz (84.097 kg)    Current diet: patients states eats anything.  Chest Rales: Patient is here to re-evaluate his abnormal chest sound that was noticed 2 weeks ago. Patient was advised to increase his Symbicort and was also prescribe Levaquin for the increased bronchitis and the abnormal lung sounds.Patient got a chest x-ray to rule out pneumonia which showed no pneumonia, but did show active COPD with acute exacerbation. He was also given him Best boy for his daytime cough. Patient states he has not use the Symbicort about 4 days together with the other medicines. Patient states the Levaquin cause nauseas.     No Known Allergies Previous Medications   ACETAMINOPHEN (TYLENOL) 325 MG TABLET    Take 650 mg by mouth every 6 (six) hours as needed. Reported on 10/28/2015   BENZONATATE (TESSALON) 200 MG CAPSULE    Take 1 capsule (200 mg total) by mouth 3 (three) times daily as needed for cough.   BUDESONIDE-FORMOTEROL (SYMBICORT) 160-4.5 MCG/ACT INHALER    Inhale 2 puffs into the lungs 2 (two) times daily.   LEVOFLOXACIN (LEVAQUIN)  500 MG TABLET    Take 1 tablet (500 mg total) by mouth daily.    Review of Systems  Constitutional: Negative.   HENT: Negative.   Eyes: Negative.   Respiratory: Positive for cough and shortness of breath. Negative for chest tightness and wheezing.   Cardiovascular: Negative for chest pain, palpitations and leg swelling.  Gastrointestinal: Negative for nausea, vomiting and abdominal pain.  Neurological: Negative for dizziness, light-headedness and headaches.    Social History  Substance Use Topics  . Smoking status: Current Every Day Smoker -- 1.00 packs/day for 39 years    Types: Cigarettes  . Smokeless tobacco: Current User    Types: Chew  . Alcohol Use: 0.0 oz/week    0 Standard drinks or equivalent per week     Comment: occaionally   Objective:   BP 148/98 mmHg  Pulse 80  Temp(Src) 98.3 F (36.8 C) (Oral)  Resp 16  Wt 185 lb (83.915 kg)  SpO2 95%  Physical Exam  Constitutional: He appears well-developed and well-nourished. No distress.  HENT:  Head: Normocephalic and atraumatic.  Right Ear: Hearing, tympanic membrane, external ear and ear canal normal.  Left Ear: Hearing, tympanic membrane, external ear and ear canal normal.  Nose: Nose normal.  Mouth/Throat: Uvula is midline, oropharynx is clear and moist and mucous membranes are normal. No oropharyngeal exudate.  Eyes: Conjunctivae and EOM are normal.  Pupils are equal, round, and reactive to light. Right eye exhibits no discharge. Left eye exhibits no discharge.  Neck: Normal range of motion. Neck supple. No JVD present. No tracheal deviation present. No Brudzinski's sign and no Kernig's sign noted. No thyromegaly present.  Cardiovascular: Normal rate, regular rhythm and normal heart sounds.  Exam reveals no gallop and no friction rub.   No murmur heard. Pulmonary/Chest: Effort normal. No stridor. No respiratory distress. He has no decreased breath sounds. He has no wheezes. He has rhonchi (expiratory rhonchi heard  throughout). He has no rales (rales cleared from previous). He exhibits no tenderness.  Lymphadenopathy:    He has no cervical adenopathy.  Skin: Skin is warm and dry.  Vitals reviewed.       Assessment & Plan:     1. Benign essential HTN Blood pressure was improved from previous visit of 160/100-148/98 today blood pressure is still elevated however and I do feel it is best to start a medication for lowering blood pressure at this time. He voiced understanding and agrees to try to take the medication. He does admit that he is not very compliant and does forget to take medications. He states that he will get a pillbox so that he be able to remember to take this medication regularly. I will prescribe hydrochlorothiazide 25 mg as below. I did advise him to try to adhere to a low-salt, heart healthy diet. He voiced understanding and states that he knows he does need to change how he eats. I also advised for him to try to make sure to stay physically active. He states that he does not have an exercise routine but that he is very active at work. He is to call the office if he has any adverse reactions to the medication, questions or concerns. I will follow-up with him in 4 weeks to recheck his blood pressure. - hydrochlorothiazide (HYDRODIURIL) 25 MG tablet; Take 1 tablet (25 mg total) by mouth daily.  Dispense: 30 tablet; Refill: 1  2. Mixed simple and mucopurulent chronic bronchitis ([place]) On exam today he does continue to have expiratory rhonchi but the rales have improved. I do feel that he is over the acute exacerbation that he had 2 weeks ago. He does have a Symbicort inhaler that he does not use regularly. I did advise him to try to at least get 2 puffs once a day even if he cannot remember to do it twice a day as this will help his lung function better than not taking it at all. I discussed with him the fact that COPD is a progressive disease and this is the best way for [place] to slow the progression.  He voiced understanding and states that he will try to start using the Symbicort more regularly. He does state that when he does use it he notices he does breathe better. I will follow-up with him in 4 weeks to see how he is doing with use of the Symbicort. May possibly consider a pulmonary function study if he is still without acute exacerbation at that time. He is to call the office if he has any acute issues, questions or concerns.       Mar Daring, PA-C  [facility]

## 2015-11-25 ENCOUNTER — Ambulatory Visit: Payer: BLUE CROSS/BLUE SHIELD | Admitting: Physician Assistant

## 2016-01-19 ENCOUNTER — Ambulatory Visit (INDEPENDENT_AMBULATORY_CARE_PROVIDER_SITE_OTHER): Payer: BLUE CROSS/BLUE SHIELD | Admitting: Physician Assistant

## 2016-01-19 ENCOUNTER — Encounter: Payer: Self-pay | Admitting: Physician Assistant

## 2016-01-19 VITALS — BP 170/90 | HR 90 | Temp 99.2°F | Resp 15 | Wt 185.4 lb

## 2016-01-19 DIAGNOSIS — R6889 Other general symptoms and signs: Secondary | ICD-10-CM | POA: Diagnosis not present

## 2016-01-19 DIAGNOSIS — J101 Influenza due to other identified influenza virus with other respiratory manifestations: Secondary | ICD-10-CM

## 2016-01-19 LAB — POCT INFLUENZA A/B
INFLUENZA A, POC: NEGATIVE
Influenza B, POC: POSITIVE — AB

## 2016-01-19 MED ORDER — OSELTAMIVIR PHOSPHATE 75 MG PO CAPS: 75.0000 mg | ORAL_CAPSULE | Freq: Two times a day (BID) | ORAL | Status: DC

## 2016-01-19 NOTE — Progress Notes (Signed)
Patient: Joe Dillon Male    DOB: 1955/12/03   60 y.o.   MRN: [id number] Visit Date: 01/19/2016  Today's Provider: Mar Daring, PA-C   Chief Complaint  Patient presents with  . URI   Subjective:    URI  This is a new problem. The current episode started in the past 7 days (With the cough he started Monday). The problem has been gradually worsening. The maximum temperature recorded prior to his arrival was 101 - 101.9 F (This morning the temperature 101.2. he started with temperature last night 100.0). Associated symptoms include congestion, coughing, diarrhea, rhinorrhea, sinus pain, sneezing and wheezing. Pertinent negatives include no abdominal pain, chest pain, ear pain, headaches, rash, sore throat or vomiting. He has tried decongestant and acetaminophen for the symptoms. The treatment provided no relief.  He has not been around any sick contacts.     No Known Allergies Previous Medications   ACETAMINOPHEN (TYLENOL) 325 MG TABLET    Take 650 mg by mouth every 6 (six) hours as needed. Reported on 10/28/2015   BUDESONIDE-FORMOTEROL (SYMBICORT) 160-4.5 MCG/ACT INHALER    Inhale 2 puffs into the lungs 2 (two) times daily.   HYDROCHLOROTHIAZIDE (HYDRODIURIL) 25 MG TABLET    Take 1 tablet (25 mg total) by mouth daily.    Review of Systems  Constitutional: Positive for fever and fatigue. Negative for chills.  HENT: Positive for congestion, postnasal drip, rhinorrhea, sinus pressure and sneezing. Negative for ear pain and sore throat.   Respiratory: Positive for cough, chest tightness, shortness of breath and wheezing.   Cardiovascular: Negative for chest pain, palpitations and leg swelling.  Gastrointestinal: Positive for diarrhea. Negative for vomiting and abdominal pain.  Skin: Negative for rash.  Neurological: Positive for weakness. Negative for dizziness and headaches.    Social History  Substance Use Topics  . Smoking status: Current Every Day Smoker --  1.00 packs/day for 39 years    Types: Cigarettes  . Smokeless tobacco: Current User    Types: Chew  . Alcohol Use: 0.0 oz/week    0 Standard drinks or equivalent per week     Comment: occaionally   Objective:   BP 170/90 mmHg  Pulse 90  Temp(Src) 99.2 F (37.3 C) (Oral)  Resp 15  Wt 185 lb 6.4 oz (84.097 kg)  SpO2 95%  Physical Exam  Constitutional: He appears well-developed and well-nourished. No distress.  HENT:  Head: Normocephalic and atraumatic.  Right Ear: Hearing, tympanic membrane, external ear and ear canal normal. Tympanic membrane is not erythematous and not bulging. No middle ear effusion.  Left Ear: Hearing, tympanic membrane, external ear and ear canal normal. Tympanic membrane is not erythematous and not bulging.  No middle ear effusion.  Nose: Mucosal edema and rhinorrhea present. Right sinus exhibits no maxillary sinus tenderness and no frontal sinus tenderness. Left sinus exhibits no maxillary sinus tenderness and no frontal sinus tenderness.  Mouth/Throat: Uvula is midline, oropharynx is clear and moist and mucous membranes are normal. No oropharyngeal exudate, posterior oropharyngeal edema or posterior oropharyngeal erythema.  Eyes: Conjunctivae and EOM are normal. Pupils are equal, round, and reactive to light. Right eye exhibits no discharge. Left eye exhibits no discharge.  Neck: Normal range of motion. Neck supple. No tracheal deviation present. No Brudzinski's sign and no Kernig's sign noted. No thyromegaly present.  Cardiovascular: Normal rate, regular rhythm and normal heart sounds.  Exam reveals no gallop and no friction rub.   No murmur  heard. Pulmonary/Chest: Effort normal. No accessory muscle usage or stridor. No respiratory distress. He has no decreased breath sounds. He has no wheezes. He has rhonchi (throughout (inspiratory and expiratory)). He has no rales.  Lymphadenopathy:    He has no cervical adenopathy.  Skin: Skin is warm and dry. He is not  diaphoretic.  Vitals reviewed.       Assessment & Plan:     1. Flu-like symptoms Positive for influenza B. - POCT Influenza A/B  2. Influenza B Being the symptoms onset Monday he is still within the 48-hour treatment window. Tamiflu was prescribed as below. He may  Alternate taking Tylenol and ibuprofen as needed for fever and body aches. I did advise him to start using his Symbicort daily to prevent developing bronchitis or pneumonia. He is to call the office or go to the hospital if he develops chest pain and shortness of breath. I will see him back in 2 weeks to reevaluate how he is doing and to recheck his blood pressure. We will most likely start a different medications since the HCTZ caused dizziness for blood pressure control. We will also discuss smoking cessation at that time. - oseltamivir (TAMIFLU) 75 MG capsule; Take 1 capsule (75 mg total) by mouth 2 (two) times daily.  Dispense: 10 capsule; Refill: 0       Mar Daring, PA-C  [facility]

## 2016-01-19 NOTE — Patient Instructions (Signed)

## 2016-01-26 ENCOUNTER — Encounter: Payer: Self-pay | Admitting: Physician Assistant

## 2016-01-26 ENCOUNTER — Ambulatory Visit
Admission: RE | Admit: 2016-01-26 | Discharge: 2016-01-26 | Disposition: A | Discharge status: Home / Self Care | Payer: BLUE CROSS/BLUE SHIELD | Source: Ambulatory Visit | Attending: Physician Assistant | Admitting: Physician Assistant

## 2016-01-26 ENCOUNTER — Ambulatory Visit (INDEPENDENT_AMBULATORY_CARE_PROVIDER_SITE_OTHER): Payer: BLUE CROSS/BLUE SHIELD | Admitting: Physician Assistant

## 2016-01-26 ENCOUNTER — Telehealth: Payer: Self-pay

## 2016-01-26 VITALS — BP 160/98 | HR 71 | Temp 98.4°F | Resp 18 | Wt 183.8 lb

## 2016-01-26 DIAGNOSIS — R062 Wheezing: Secondary | ICD-10-CM | POA: Insufficient documentation

## 2016-01-26 DIAGNOSIS — J40 Bronchitis, not specified as acute or chronic: Secondary | ICD-10-CM | POA: Insufficient documentation

## 2016-01-26 DIAGNOSIS — R05 Cough: Secondary | ICD-10-CM

## 2016-01-26 DIAGNOSIS — R059 Cough, unspecified: Secondary | ICD-10-CM

## 2016-01-26 MED ORDER — AZITHROMYCIN 250 MG PO TABS: ORAL_TABLET | ORAL | Status: DC

## 2016-01-26 MED ORDER — HYDROCODONE-HOMATROPINE 5-1.5 MG/5ML PO SYRP: 5.0000 mL | ORAL_SOLUTION | Freq: Three times a day (TID) | ORAL | Status: DC | PRN

## 2016-01-26 MED ORDER — PREDNISONE 10 MG PO TABS: ORAL_TABLET | ORAL | Status: DC

## 2016-01-26 NOTE — Progress Notes (Signed)
Patient: Joe Dillon Male    DOB: 10/30/1956   60 y.o.   MRN: [id number] Visit Date: 01/26/2016  Today's Provider: Mar Daring, PA-C   Chief Complaint  Patient presents with  . Shortness of Breath   Subjective:   [name] is here today with c/o of SOB. He was seen last week and was Positive for Influenza B. Was advised to start using his Symbicort daily to prevent developing bronchitis and pneumonia. Shortness of Breath This is a new problem. The current episode started 1 to 4 weeks ago (Since he was here last time, but it has been worst for the past 2 days.). The problem occurs constantly. The problem has been gradually worsening. Associated symptoms include neck pain, rhinorrhea and wheezing. Pertinent negatives include no abdominal pain, chest pain, ear pain, fever, headaches, leg pain, leg swelling, sore throat, sputum production, swollen glands, syncope or vomiting. The symptoms are aggravated by any activity (The musty smells. Walking or any activity). He has tried steroid inhalers (Symbicort this morning) for the symptoms. The treatment provided no relief.       No Known Allergies Previous Medications   ACETAMINOPHEN (TYLENOL) 325 MG TABLET    Take 650 mg by mouth every 6 (six) hours as needed. Reported on 10/28/2015   BUDESONIDE-FORMOTEROL (SYMBICORT) 160-4.5 MCG/ACT INHALER    Inhale 2 puffs into the lungs 2 (two) times daily.   OSELTAMIVIR (TAMIFLU) 75 MG CAPSULE    Take 1 capsule (75 mg total) by mouth 2 (two) times daily.    Review of Systems  Constitutional: Positive for appetite change and fatigue. Negative for fever and activity change.  HENT: Positive for congestion and rhinorrhea. Negative for ear pain, postnasal drip, sinus pressure, sneezing, sore throat and trouble swallowing.   Respiratory: Positive for chest tightness, shortness of breath and wheezing. Negative for sputum production.   Cardiovascular: Negative for chest pain, leg swelling  and syncope.  Gastrointestinal: Negative for vomiting and abdominal pain.  Musculoskeletal: Positive for neck pain.  Neurological: Negative for dizziness and headaches.    Social History  Substance Use Topics  . Smoking status: Current Every Day Smoker -- 1.00 packs/day for 39 years    Types: Cigarettes  . Smokeless tobacco: Current User    Types: Chew  . Alcohol Use: 0.0 oz/week    0 Standard drinks or equivalent per week     Comment: occaionally   Objective:   BP 160/98 mmHg  Pulse 71  Temp(Src) 98.4 F (36.9 C) (Oral)  Resp 18  Wt 183 lb 12.8 oz (83.371 kg)  SpO2 94%  Physical Exam  Constitutional: He appears well-developed and well-nourished. No distress.  HENT:  Head: Normocephalic and atraumatic.  Right Ear: Hearing, tympanic membrane, external ear and ear canal normal. Tympanic membrane is not erythematous and not bulging. No middle ear effusion.  Left Ear: Hearing, tympanic membrane, external ear and ear canal normal. Tympanic membrane is not erythematous and not bulging.  No middle ear effusion.  Nose: Mucosal edema and rhinorrhea present. Right sinus exhibits no maxillary sinus tenderness and no frontal sinus tenderness. Left sinus exhibits no maxillary sinus tenderness and no frontal sinus tenderness.  Mouth/Throat: Uvula is midline and mucous membranes are normal. Posterior oropharyngeal erythema present. No oropharyngeal exudate or posterior oropharyngeal edema.  Eyes: Conjunctivae and EOM are normal. Pupils are equal, round, and reactive to light. Right eye exhibits no discharge. Left eye exhibits no discharge.  Neck: Normal range  of motion. Neck supple. No tracheal deviation present. No Brudzinski's sign and no Kernig's sign noted. No thyromegaly present.  Cardiovascular: Normal rate, regular rhythm and normal heart sounds.  Exam reveals no gallop and no friction rub.   No murmur heard. Pulmonary/Chest: Effort normal. No stridor. No respiratory distress. He has no  decreased breath sounds. He has wheezes (expiratory wheezes heard throughout). He has no rhonchi. He has no rales.  Lymphadenopathy:    He has no cervical adenopathy.  Skin: Skin is warm and dry. He is not diaphoretic.  Vitals reviewed.       Assessment & Plan:     1. Bronchitis Will get CXR to R/O pneumonia. Will start prednisone, zpak and hycodan cough syrup as below. May take tylenol for fevers and body aches. Continue symbicort. May benefit from adding allergy relief such as claritin or zyrtec as well. Stay well hydrated. Call if symptoms do not improve or worsen. - DG Chest 2 View; Future - predniSONE (DELTASONE) 10 MG tablet; Take 6 tabs PO on day 1&2, 5 tabs PO on day 3&4, 4 tabs PO on day 5&6, 3 tabs PO on day 7&8, 2 tabs PO on day 9&10, 1 tab PO on day 11&12.  Dispense: 42 tablet; Refill: 0 - azithromycin (ZITHROMAX) 250 MG tablet; Take 2 tablets PO on day one, and one tablet PO daily thereafter until completed.  Dispense: 6 tablet; Refill: 0  2. Cough See above medical treatment plan. - DG Chest 2 View; Future - predniSONE (DELTASONE) 10 MG tablet; Take 6 tabs PO on day 1&2, 5 tabs PO on day 3&4, 4 tabs PO on day 5&6, 3 tabs PO on day 7&8, 2 tabs PO on day 9&10, 1 tab PO on day 11&12.  Dispense: 42 tablet; Refill: 0 - HYDROcodone-homatropine (HYCODAN) 5-1.5 MG/5ML syrup; Take 5 mLs by mouth every 8 (eight) hours as needed.  Dispense: 180 mL; Refill: 0  3. Wheeze See above medical treatment plan. - DG Chest 2 View; Future       Mar Daring, PA-C  [facility]

## 2016-01-26 NOTE — Patient Instructions (Signed)

## 2016-01-26 NOTE — Telephone Encounter (Signed)
Patient's wife called saying that patient is having trouble breathing. She reports that patient describes it as he is unable to take a deep/extra breath. She reports that he was seen in the office last week and was diagnosed with Influenza B and was treated with Tamiflu. She reports that his flu symptoms have resolved, but now he has shortness of breath. She reports that his breaths are unlabored, and it is nonexertional. Denies dizziness and he still has a mild cough. Per Tawanna Sat, patient needs to be re-examined. Scheduled patient an appt today at 2pm.

## 2016-01-27 ENCOUNTER — Telehealth: Payer: Self-pay

## 2016-01-27 NOTE — Telephone Encounter (Signed)
-----   Message from Mar Daring, [place] sent at 01/26/2016  5:15 PM EDT ----- Normal CXR. No pneumonia. Most likely bronchitis continue treatment plan as discussed and call if symptoms do not improve.

## 2016-01-27 NOTE — Telephone Encounter (Signed)
LMTCB  Thanks,  -Joseline 

## 2016-01-27 NOTE — Telephone Encounter (Signed)
Patient advised as directed below.  Thanks,  -Airianna Kreischer 

## 2016-02-02 ENCOUNTER — Ambulatory Visit: Payer: BLUE CROSS/BLUE SHIELD | Admitting: Physician Assistant

## 2016-02-04 ENCOUNTER — Ambulatory Visit: Payer: BLUE CROSS/BLUE SHIELD | Admitting: Physician Assistant

## 2016-02-08 ENCOUNTER — Ambulatory Visit: Payer: BLUE CROSS/BLUE SHIELD | Admitting: Physician Assistant

## 2016-02-09 ENCOUNTER — Ambulatory Visit (INDEPENDENT_AMBULATORY_CARE_PROVIDER_SITE_OTHER): Payer: BLUE CROSS/BLUE SHIELD | Admitting: Physician Assistant

## 2016-02-09 ENCOUNTER — Ambulatory Visit: Payer: BLUE CROSS/BLUE SHIELD | Admitting: Physician Assistant

## 2016-02-09 ENCOUNTER — Encounter: Payer: Self-pay | Admitting: Physician Assistant

## 2016-02-09 VITALS — BP 160/90 | HR 88 | Temp 98.4°F | Resp 16 | Wt 185.0 lb

## 2016-02-09 DIAGNOSIS — I10 Essential (primary) hypertension: Secondary | ICD-10-CM

## 2016-02-09 MED ORDER — LISINOPRIL 20 MG PO TABS: 20.0000 mg | ORAL_TABLET | Freq: Every day | ORAL | Status: DC

## 2016-02-09 NOTE — Progress Notes (Signed)
Patient: Joe Dillon Male    DOB: 06-26-1956   60 y.o.   MRN: [id number] Visit Date: 02/09/2016  Today's Provider: Mar Daring, PA-C   Chief Complaint  Patient presents with  . Follow-up    Hypertension, and smoking Counseling.   Subjective:    HPI Smoking: Patient wants to quit smoking.  Hypertension, follow-up:  BP Readings from Last 3 Encounters:  02/09/16 160/90  01/26/16 160/98  01/19/16 170/90    He was last seen for hypertension 3 weeks ago.  BP at that visit was 148/98. Management since that visit includes HCTZ but patient reported causing dizziness. He reports (he is not on any BP medication) . He is not exercising.Stays very active at work. He is not adherent to low salt diet.   Outside blood pressures are n/a. He is experiencing none.  Patient denies chest pain, chest pressure/discomfort, fatigue, irregular heart beat, lower extremity edema, near-syncope and palpitations.   Cardiovascular risk factors include advanced age (older than 40 for men, 106 for women).      Weight trend: stable Wt Readings from Last 3 Encounters:  02/09/16 185 lb (83.915 kg)  01/26/16 183 lb 12.8 oz (83.371 kg)  01/19/16 185 lb 6.4 oz (84.097 kg)    Current diet: in general, an "unhealthy" diet  ------------------------------------------------------------------------      No Known Allergies Previous Medications   ACETAMINOPHEN (TYLENOL) 325 MG TABLET    Take 650 mg by mouth every 6 (six) hours as needed. Reported on 10/28/2015   AZITHROMYCIN (ZITHROMAX) 250 MG TABLET    Take 2 tablets PO on day one, and one tablet PO daily thereafter until completed.   BUDESONIDE-FORMOTEROL (SYMBICORT) 160-4.5 MCG/ACT INHALER    Inhale 2 puffs into the lungs 2 (two) times daily.   HYDROCODONE-HOMATROPINE (HYCODAN) 5-1.5 MG/5ML SYRUP    Take 5 mLs by mouth every 8 (eight) hours as needed.   PREDNISONE (DELTASONE) 10 MG TABLET    Take 6 tabs PO on day 1&2, 5 tabs PO on day  3&4, 4 tabs PO on day 5&6, 3 tabs PO on day 7&8, 2 tabs PO on day 9&10, 1 tab PO on day 11&12.    Review of Systems  Constitutional: Negative.   Eyes: Negative for visual disturbance.  Respiratory: Negative.   Cardiovascular: Negative for chest pain, palpitations and leg swelling.  Gastrointestinal: Negative.   Musculoskeletal: Negative.   Neurological: Negative for dizziness, weakness, light-headedness and headaches.    Social History  Substance Use Topics  . Smoking status: Current Every Day Smoker -- 1.00 packs/day for 39 years    Types: Cigarettes  . Smokeless tobacco: Current User    Types: Chew  . Alcohol Use: 0.0 oz/week    0 Standard drinks or equivalent per week     Comment: occaionally   Objective:   BP 160/90 mmHg  Pulse 88  Temp(Src) 98.4 F (36.9 C) (Oral)  Resp 16  Wt 185 lb (83.915 kg)  Physical Exam  Constitutional: He appears well-developed and well-nourished. No distress.  HENT:  Head: Normocephalic and atraumatic.  Neck: Normal range of motion. Neck supple. No JVD present. No tracheal deviation present. No thyromegaly present.  Cardiovascular: Normal rate, regular rhythm and normal heart sounds.  Exam reveals no gallop and no friction rub.   No murmur heard. Pulmonary/Chest: Effort normal and breath sounds normal. No respiratory distress. He has no wheezes. He has no rales.  Lymphadenopathy:    He has  no cervical adenopathy.  Skin: He is not diaphoretic.  Vitals reviewed.       Assessment & Plan:     1. Essential hypertension Will add lisinopril as below for better control of blood pressure. HCTZ caused dizziness. Discussed DASH diet. Continue physical activity at work. Call if adverse reactions to lisinopril. If not, I will see back in 4 weeks to see if BP to goal. Previously he had wanted to discuss smoking cessation, but decided he is actually not ready at this time. - lisinopril (PRINIVIL,ZESTRIL) 20 MG tablet; Take 1 tablet (20 mg total) by  mouth daily.  Dispense: 30 tablet; Refill: [place], PA-C  [facility]

## 2016-02-09 NOTE — Patient Instructions (Signed)
Lisinopril tablets What is this medicine? LISINOPRIL (lyse IN oh pril) is an ACE inhibitor. This medicine is used to treat high blood pressure and heart failure. It is also used to protect the heart immediately after a heart attack. This medicine may be used for other purposes; ask your health care provider or pharmacist if you have questions. What should I tell my health care provider before I take this medicine? They need to know if you have any of these conditions: -diabetes -heart or blood vessel disease -kidney disease -low blood pressure -previous swelling of the tongue, face, or lips with difficulty breathing, difficulty swallowing, hoarseness, or tightening of the throat -an unusual or allergic reaction to lisinopril, other ACE inhibitors, insect venom, foods, dyes, or preservatives -pregnant or trying to get pregnant -breast-feeding How should I use this medicine? Take this medicine by mouth with a glass of water. Follow the directions on your prescription label. You may take this medicine with or without food. If it upsets your stomach, take it with food. Take your medicine at regular intervals. Do not take it more often than directed. Do not stop taking except on your doctor's advice. Talk to your pediatrician regarding the use of this medicine in children. Special care may be needed. While this drug may be prescribed for children as young as 61 years of age for selected conditions, precautions do apply. Overdosage: If you think you have taken too much of this medicine contact a poison control center or emergency room at once. NOTE: This medicine is only for you. Do not share this medicine with others. What if I miss a dose? If you miss a dose, take it as soon as you can. If it is almost time for your next dose, take only that dose. Do not take double or extra doses. What may interact with this medicine? Do not take this medicine with any of the following medications: -hymenoptera  venomThis medicines may also interact with the following medications: -aliskiren -angiotensin receptor blockers, like losartan or valsartan -certain medicines for diabetes -diuretics -everolimus -gold compounds -lithium -NSAIDs, medicines for pain and inflammation, like ibuprofen or naproxen -potassium salts or supplements -salt substitutes -sirolimus -temsirolimus This list may not describe all possible interactions. Give your health care provider a list of all the medicines, herbs, non-prescription drugs, or dietary supplements you use. Also tell them if you smoke, drink alcohol, or use illegal drugs. Some items may interact with your medicine. What should I watch for while using this medicine? Visit your doctor or health care professional for regular check ups. Check your blood pressure as directed. Ask your doctor what your blood pressure should be, and when you should contact him or her. Do not treat yourself for coughs, colds, or pain while you are using this medicine without asking your doctor or health care professional for advice. Some ingredients may increase your blood pressure. Women should inform their doctor if they wish to become pregnant or think they might be pregnant. There is a potential for serious side effects to an unborn child. Talk to your health care professional or pharmacist for more information. Check with your doctor or health care professional if you get an attack of severe diarrhea, nausea and vomiting, or if you sweat a lot. The loss of too much body fluid can make it dangerous for you to take this medicine. You may get drowsy or dizzy. Do not drive, use machinery, or do anything that needs mental alertness until you know how  this drug affects you. Do not stand or sit up quickly, especially if you are an older patient. This reduces the risk of dizzy or fainting spells. Alcohol can make you more drowsy and dizzy. Avoid alcoholic drinks. Avoid salt substitutes unless  you are told otherwise by your doctor or health care professional. What side effects may I notice from receiving this medicine? Side effects that you should report to your doctor or health care professional as soon as possible: -allergic reactions like skin rash, itching or hives, swelling of the hands, feet, face, lips, throat, or tongue -breathing problems -signs and symptoms of kidney injury like trouble passing urine or change in the amount of urine -signs and symptoms of increased potassium like muscle weakness; chest pain; or fast, irregular heartbeat -signs and symptoms of liver injury like dark yellow or brown urine; general ill feeling or flu-like symptoms; light-colored stools; loss of appetite; nausea; right upper belly pain; unusually weak or tired; yellowing of the eyes or skin -signs and symptoms of low blood pressure like dizziness; feeling faint or lightheaded, falls; unusually weak or tired -stomach pain with or without nausea and vomiting Side effects that usually do not require medical attention (report to your doctor or health care professional if they continue or are bothersome): -changes in taste -cough -dizziness -fever -headache -sensitivity to light This list may not describe all possible side effects. Call your doctor for medical advice about side effects. You may report side effects to FDA at 1-800-FDA-1088. Where should I keep my medicine? Keep out of the reach of children. Store at room temperature between 15 and 30 degrees C (59 and 86 degrees F). Protect from moisture. Keep container tightly closed. Throw away any unused medicine after the expiration date. NOTE: This sheet is a summary. It may not cover all possible information. If you have questions about this medicine, talk to your doctor, pharmacist, or health care provider.    2016, Elsevier/Gold Standard. (2015-06-17 20:38:20) DASH Eating Plan DASH stands for "Dietary Approaches to Stop Hypertension." The  DASH eating plan is a healthy eating plan that has been shown to reduce high blood pressure (hypertension). Additional health benefits may include reducing the risk of type 2 diabetes mellitus, heart disease, and stroke. The DASH eating plan may also help with weight loss. WHAT DO I NEED TO KNOW ABOUT THE DASH EATING PLAN? For the DASH eating plan, you will follow these general guidelines:  Choose foods with a percent daily value for sodium of less than 5% (as listed on the food label).  Use salt-free seasonings or herbs instead of table salt or sea salt.  Check with your health care provider or pharmacist before using salt substitutes.  Eat lower-sodium products, often labeled as "lower sodium" or "no salt added."  Eat fresh foods.  Eat more vegetables, fruits, and low-fat dairy products.  Choose whole grains. Look for the word "whole" as the first word in the ingredient list.  Choose fish and skinless chicken or [place] more often than red meat. Limit fish, poultry, and meat to 6 oz (170 g) each day.  Limit sweets, desserts, sugars, and sugary drinks.  Choose heart-healthy fats.  Limit cheese to 1 oz (28 g) per day.  Eat more home-cooked food and less restaurant, buffet, and fast food.  Limit fried foods.  Cook foods using methods other than frying.  Limit canned vegetables. If you do use them, rinse them well to decrease the sodium.  When eating at a restaurant,  ask that your food be prepared with less salt, or no salt if possible. WHAT FOODS CAN I EAT? Seek help from a dietitian for individual calorie needs. Grains Whole grain or whole wheat bread. Brown rice. Whole grain or whole wheat pasta. Quinoa, bulgur, and whole grain cereals. Low-sodium cereals. Corn or whole wheat flour tortillas. Whole grain cornbread. Whole grain crackers. Low-sodium crackers. Vegetables Fresh or frozen vegetables (raw, steamed, roasted, or grilled). Low-sodium or reduced-sodium tomato and  vegetable juices. Low-sodium or reduced-sodium tomato sauce and paste. Low-sodium or reduced-sodium canned vegetables.  Fruits All fresh, canned (in natural juice), or frozen fruits. Meat and Other Protein Products Ground beef (85% or leaner), grass-fed beef, or beef trimmed of fat. Skinless chicken or [place]. Ground chicken or [place]. Pork trimmed of fat. All fish and seafood. Eggs. Dried beans, peas, or lentils. Unsalted nuts and seeds. Unsalted canned beans. Dairy Low-fat dairy products, such as skim or 1% milk, 2% or reduced-fat cheeses, low-fat ricotta or cottage cheese, or plain low-fat yogurt. Low-sodium or reduced-sodium cheeses. Fats and Oils Tub margarines without trans fats. Light or reduced-fat mayonnaise and salad dressings (reduced sodium). Avocado. Safflower, olive, or canola oils. Natural peanut or almond butter. Other Unsalted popcorn and pretzels. The items listed above may not be a complete list of recommended foods or beverages. Contact your dietitian for more options. WHAT FOODS ARE NOT RECOMMENDED? Grains White bread. White pasta. White rice. Refined cornbread. Bagels and croissants. Crackers that contain trans fat. Vegetables Creamed or fried vegetables. Vegetables in a cheese sauce. Regular canned vegetables. Regular canned tomato sauce and paste. Regular tomato and vegetable juices. Fruits Dried fruits. Canned fruit in light or heavy syrup. Fruit juice. Meat and Other Protein Products Fatty cuts of meat. Ribs, chicken wings, bacon, sausage, bologna, salami, chitterlings, fatback, hot dogs, bratwurst, and packaged luncheon meats. Salted nuts and seeds. Canned beans with salt. Dairy Whole or 2% milk, cream, half-and-half, and cream cheese. Whole-fat or sweetened yogurt. Full-fat cheeses or blue cheese. Nondairy creamers and whipped toppings. Processed cheese, cheese spreads, or cheese curds. Condiments Onion and garlic salt, seasoned salt, table salt, and sea salt.  Canned and packaged gravies. Worcestershire sauce. Tartar sauce. Barbecue sauce. Teriyaki sauce. Soy sauce, including reduced sodium. Steak sauce. Fish sauce. Oyster sauce. Cocktail sauce. Horseradish. Ketchup and mustard. Meat flavorings and tenderizers. Bouillon cubes. Hot sauce. Tabasco sauce. Marinades. Taco seasonings. Relishes. Fats and Oils Butter, stick margarine, lard, shortening, ghee, and bacon fat. Coconut, palm kernel, or palm oils. Regular salad dressings. Other Pickles and olives. Salted popcorn and pretzels. The items listed above may not be a complete list of foods and beverages to avoid. Contact your dietitian for more information. WHERE CAN I FIND MORE INFORMATION? National Heart, Lung, and Blood Institute: travelstabloid.com   This information is not intended to replace advice given to you by your health care provider. Make sure you discuss any questions you have with your health care provider.   Document Released: 10/12/2011 Document Revised: 11/13/2014 Document Reviewed: 08/27/2013 Elsevier Interactive Patient Education [employer].

## 2016-03-07 ENCOUNTER — Ambulatory Visit: Payer: BLUE CROSS/BLUE SHIELD | Admitting: Physician Assistant

## 2016-03-09 ENCOUNTER — Ambulatory Visit: Payer: BLUE CROSS/BLUE SHIELD | Admitting: Physician Assistant

## 2016-03-10 ENCOUNTER — Ambulatory Visit (INDEPENDENT_AMBULATORY_CARE_PROVIDER_SITE_OTHER): Payer: BLUE CROSS/BLUE SHIELD | Admitting: Physician Assistant

## 2016-03-10 ENCOUNTER — Encounter: Payer: Self-pay | Admitting: Physician Assistant

## 2016-03-10 VITALS — BP 142/78 | HR 78 | Temp 98.0°F | Resp 16 | Ht 70.0 in | Wt 183.0 lb

## 2016-03-10 DIAGNOSIS — J40 Bronchitis, not specified as acute or chronic: Secondary | ICD-10-CM

## 2016-03-10 DIAGNOSIS — R05 Cough: Secondary | ICD-10-CM | POA: Diagnosis not present

## 2016-03-10 DIAGNOSIS — I10 Essential (primary) hypertension: Secondary | ICD-10-CM

## 2016-03-10 DIAGNOSIS — R059 Cough, unspecified: Secondary | ICD-10-CM

## 2016-03-10 MED ORDER — BUDESONIDE-FORMOTEROL FUMARATE 160-4.5 MCG/ACT IN AERO: 2.0000 | INHALATION_SPRAY | Freq: Two times a day (BID) | RESPIRATORY_TRACT | Status: DC

## 2016-03-10 MED ORDER — LISINOPRIL 20 MG PO TABS: 20.0000 mg | ORAL_TABLET | Freq: Every day | ORAL | Status: DC

## 2016-03-10 NOTE — Patient Instructions (Signed)
DASH Eating Plan  DASH stands for "Dietary Approaches to Stop Hypertension." The DASH eating plan is a healthy eating plan that has been shown to reduce high blood pressure (hypertension). Additional health benefits may include reducing the risk of type 2 diabetes mellitus, heart disease, and stroke. The DASH eating plan may also help with weight loss.  WHAT DO I NEED TO KNOW ABOUT THE DASH EATING PLAN?  For the DASH eating plan, you will follow these general guidelines:  · Choose foods with a percent daily value for sodium of less than 5% (as listed on the food label).  · Use salt-free seasonings or herbs instead of table salt or sea salt.  · Check with your health care provider or pharmacist before using salt substitutes.  · Eat lower-sodium products, often labeled as "lower sodium" or "no salt added."  · Eat fresh foods.  · Eat more vegetables, fruits, and low-fat dairy products.  · Choose whole grains. Look for the word "whole" as the first word in the ingredient list.  · Choose fish and skinless chicken or turkey more often than red meat. Limit fish, poultry, and meat to 6 oz (170 g) each day.  · Limit sweets, desserts, sugars, and sugary drinks.  · Choose heart-healthy fats.  · Limit cheese to 1 oz (28 g) per day.  · Eat more home-cooked food and less restaurant, buffet, and fast food.  · Limit fried foods.  · Cook foods using methods other than frying.  · Limit canned vegetables. If you do use them, rinse them well to decrease the sodium.  · When eating at a restaurant, ask that your food be prepared with less salt, or no salt if possible.  WHAT FOODS CAN I EAT?  Seek help from a dietitian for individual calorie needs.  Grains  Whole grain or whole wheat bread. Brown rice. Whole grain or whole wheat pasta. Quinoa, bulgur, and whole grain cereals. Low-sodium cereals. Corn or whole wheat flour tortillas. Whole grain cornbread. Whole grain crackers. Low-sodium crackers.  Vegetables  Fresh or frozen vegetables  (raw, steamed, roasted, or grilled). Low-sodium or reduced-sodium tomato and vegetable juices. Low-sodium or reduced-sodium tomato sauce and paste. Low-sodium or reduced-sodium canned vegetables.   Fruits  All fresh, canned (in natural juice), or frozen fruits.  Meat and Other Protein Products  Ground beef (85% or leaner), grass-fed beef, or beef trimmed of fat. Skinless chicken or turkey. Ground chicken or turkey. Pork trimmed of fat. All fish and seafood. Eggs. Dried beans, peas, or lentils. Unsalted nuts and seeds. Unsalted canned beans.  Dairy  Low-fat dairy products, such as skim or 1% milk, 2% or reduced-fat cheeses, low-fat ricotta or cottage cheese, or plain low-fat yogurt. Low-sodium or reduced-sodium cheeses.  Fats and Oils  Tub margarines without trans fats. Light or reduced-fat mayonnaise and salad dressings (reduced sodium). Avocado. Safflower, olive, or canola oils. Natural peanut or almond butter.  Other  Unsalted popcorn and pretzels.  The items listed above may not be a complete list of recommended foods or beverages. Contact your dietitian for more options.  WHAT FOODS ARE NOT RECOMMENDED?  Grains  White bread. White pasta. White rice. Refined cornbread. Bagels and croissants. Crackers that contain trans fat.  Vegetables  Creamed or fried vegetables. Vegetables in a cheese sauce. Regular canned vegetables. Regular canned tomato sauce and paste. Regular tomato and vegetable juices.  Fruits  Dried fruits. Canned fruit in light or heavy syrup. Fruit juice.  Meat and Other Protein   Products  Fatty cuts of meat. Ribs, chicken wings, bacon, sausage, bologna, salami, chitterlings, fatback, hot dogs, bratwurst, and packaged luncheon meats. Salted nuts and seeds. Canned beans with salt.  Dairy  Whole or 2% milk, cream, half-and-half, and cream cheese. Whole-fat or sweetened yogurt. Full-fat cheeses or blue cheese. Nondairy creamers and whipped toppings. Processed cheese, cheese spreads, or cheese  curds.  Condiments  Onion and garlic salt, seasoned salt, table salt, and sea salt. Canned and packaged gravies. Worcestershire sauce. Tartar sauce. Barbecue sauce. Teriyaki sauce. Soy sauce, including reduced sodium. Steak sauce. Fish sauce. Oyster sauce. Cocktail sauce. Horseradish. Ketchup and mustard. Meat flavorings and tenderizers. Bouillon cubes. Hot sauce. Tabasco sauce. Marinades. Taco seasonings. Relishes.  Fats and Oils  Butter, stick margarine, lard, shortening, ghee, and bacon fat. Coconut, palm kernel, or palm oils. Regular salad dressings.  Other  Pickles and olives. Salted popcorn and pretzels.  The items listed above may not be a complete list of foods and beverages to avoid. Contact your dietitian for more information.  WHERE CAN I FIND MORE INFORMATION?  National Heart, Lung, and Blood Institute: www.nhlbi.nih.gov/health/health-topics/topics/dash/     This information is not intended to replace advice given to you by your health care provider. Make sure you discuss any questions you have with your health care provider.     Document Released: 10/12/2011 Document Revised: 11/13/2014 Document Reviewed: 08/27/2013  Elsevier Interactive Patient Education ©2016 Elsevier Inc.

## 2016-03-10 NOTE — Progress Notes (Signed)
Patient ID: Joe Dillon, male   DOB: 10-Jun-1956, 60 y.o.   MRN: [id number]       Patient: Joe Dillon Male    DOB: [birth year]   60 y.o.   MRN: [id number] Visit Date: 03/10/2016  Today's Provider: Mar Daring, PA-C   Chief Complaint  Patient presents with  . Hypertension   Subjective:    HPI  Hypertension, follow-up:  BP Readings from Last 3 Encounters:  03/10/16 142/78  02/09/16 160/90  01/26/16 160/98    He was last seen for hypertension 4 weeks ago.  BP at that visit was 160/90. Management since that visit includes started LIsinopril. He reports good compliance with treatment. He is not having side effects.  He is not exercising. He is adherent to low salt diet. He is trying to cut back some on salt intake. Outside blood pressures are not being checked. Patient denies chest pain, chest pressure/discomfort, claudication, dyspnea, exertional chest pressure/discomfort, fatigue, irregular heart beat, lower extremity edema, near-syncope, orthopnea, palpitations and paroxysmal nocturnal dyspnea.     Wt Readings from Last 3 Encounters:  03/10/16 183 lb (83.008 kg)  02/09/16 185 lb (83.915 kg)  01/26/16 183 lb 12.8 oz (83.371 kg)    ------------------------------------------------------------------------       No Known Allergies Previous Medications   ACETAMINOPHEN (TYLENOL) 325 MG TABLET    Take 650 mg by mouth every 6 (six) hours as needed. Reported on 10/28/2015   AMOXICILLIN (AMOXIL) 500 MG CAPSULE    TAKE ONE CAPSULE BY MOUTH EVERY 8 HOURS UNTIL FINISHED   BUDESONIDE-FORMOTEROL (SYMBICORT) 160-4.5 MCG/ACT INHALER    Inhale 2 puffs into the lungs 2 (two) times daily.   LISINOPRIL (PRINIVIL,ZESTRIL) 20 MG TABLET    Take 1 tablet (20 mg total) by mouth daily.    Review of Systems  Constitutional: Negative.   HENT: Negative.   Eyes: Negative.   Respiratory: Negative.   Cardiovascular: Negative.   Gastrointestinal: Negative.   Endocrine:  Negative.   Genitourinary: Negative.   Musculoskeletal: Negative.   Skin: Negative.   Allergic/Immunologic: Negative.   Neurological: Negative.   Hematological: Negative.   Psychiatric/Behavioral: Negative.     Social History  Substance Use Topics  . Smoking status: Current Every Day Smoker -- 1.00 packs/day for 39 years    Types: Cigarettes  . Smokeless tobacco: Current User    Types: Chew  . Alcohol Use: 0.0 oz/week    0 Standard drinks or equivalent per week     Comment: occaionally   Objective:   BP 142/78 mmHg  Pulse 78  Temp(Src) 98 F (36.7 C) (Oral)  Resp 16  Ht 5\' 10"  (1.778 m)  Wt 183 lb (83.008 kg)  BMI 26.26 kg/m2  Physical Exam  Constitutional: He appears well-developed and well-nourished. No distress.  HENT:  Head: Normocephalic and atraumatic.  Cardiovascular: Normal rate, regular rhythm and normal heart sounds.  Exam reveals no gallop and no friction rub.   No murmur heard. Pulmonary/Chest: Effort normal and breath sounds normal. No respiratory distress. He has no wheezes. He has no rales.  Skin: He is not diaphoretic.  Vitals reviewed.       Assessment & Plan:     1. Essential hypertension Much improved. Will continue lisnopril as directed. Continue DASH diet. I will see him back in 3 months for CPE. He is to call if he develops any acute issues, questions or concerns before then. - lisinopril (PRINIVIL,ZESTRIL) 20 MG tablet; Take 1 tablet (20 mg  total) by mouth daily.  Dispense: 30 tablet; Refill: 6  2. Chronic Bronchitis Stable. Diagnosis pulled for medication refill. Continue current medical treatment plan. - budesonide-formoterol (SYMBICORT) 160-4.5 MCG/ACT inhaler; Inhale 2 puffs into the lungs 2 (two) times daily.  Dispense: 2 Inhaler; Refill: [place], PA-C  [facility]

## 2016-06-13 ENCOUNTER — Encounter: Payer: BLUE CROSS/BLUE SHIELD | Admitting: Family Medicine

## 2016-06-19 ENCOUNTER — Encounter: Payer: Self-pay | Admitting: Family Medicine

## 2016-06-19 ENCOUNTER — Ambulatory Visit (INDEPENDENT_AMBULATORY_CARE_PROVIDER_SITE_OTHER): Payer: BLUE CROSS/BLUE SHIELD | Admitting: Family Medicine

## 2016-06-19 VITALS — BP 138/90 | HR 70 | Temp 98.5°F | Ht 70.0 in | Wt 186.0 lb

## 2016-06-19 DIAGNOSIS — M19041 Primary osteoarthritis, right hand: Secondary | ICD-10-CM | POA: Diagnosis not present

## 2016-06-19 DIAGNOSIS — I10 Essential (primary) hypertension: Secondary | ICD-10-CM

## 2016-06-19 DIAGNOSIS — Z720 Tobacco use: Secondary | ICD-10-CM

## 2016-06-19 DIAGNOSIS — E782 Mixed hyperlipidemia: Secondary | ICD-10-CM

## 2016-06-19 DIAGNOSIS — Z0000 Encounter for general adult medical examination without abnormal findings: Secondary | ICD-10-CM

## 2016-06-19 DIAGNOSIS — M1811 Unilateral primary osteoarthritis of first carpometacarpal joint, right hand: Secondary | ICD-10-CM

## 2016-06-19 LAB — POCT URINALYSIS DIPSTICK
BILIRUBIN UA: NEGATIVE
Glucose, UA: NEGATIVE
Ketones, UA: NEGATIVE
LEUKOCYTES UA: NEGATIVE
NITRITE UA: NEGATIVE
PH UA: 6.5
PROTEIN UA: NEGATIVE
Spec Grav, UA: <=1.005
Urobilinogen, UA: 0.2

## 2016-06-19 NOTE — Patient Instructions (Signed)
Smoking Cessation, Tips for Success If you are ready to quit smoking, congratulations! You have chosen to help yourself be healthier. Cigarettes bring nicotine, tar, carbon monoxide, and other irritants into your body. Your lungs, heart, and blood vessels will be able to work better without these poisons. There are many different ways to quit smoking. Nicotine gum, nicotine patches, a nicotine inhaler, or nicotine nasal spray can help with physical craving. Hypnosis, support groups, and medicines help break the habit of smoking. WHAT THINGS CAN I DO TO MAKE QUITTING EASIER?  Here are some tips to help you quit for good:  Pick a date when you will quit smoking completely. Tell all of your friends and family about your plan to quit on that date.  Do not try to slowly cut down on the number of cigarettes you are smoking. Pick a quit date and quit smoking completely starting on that day.  Throw away all cigarettes.   Clean and remove all ashtrays from your home, work, and car.  On a card, write down your reasons for quitting. Carry the card with you and read it when you get the urge to smoke.  Cleanse your body of nicotine. Drink enough water and fluids to keep your urine clear or pale yellow. Do this after quitting to flush the nicotine from your body.  Learn to predict your moods. Do not let a bad situation be your excuse to have a cigarette. Some situations in your life might tempt you into wanting a cigarette.  Never have "just one" cigarette. It leads to wanting another and another. Remind yourself of your decision to quit.  Change habits associated with smoking. If you smoked while driving or when feeling stressed, try other activities to replace smoking. Stand up when drinking your coffee. Brush your teeth after eating. Sit in a different chair when you read the paper. Avoid alcohol while trying to quit, and try to drink fewer caffeinated beverages. Alcohol and caffeine may urge you to  smoke.  Avoid foods and drinks that can trigger a desire to smoke, such as sugary or spicy foods and alcohol.  Ask people who smoke not to smoke around you.  Have something planned to do right after eating or having a cup of coffee. For example, plan to take a walk or exercise.  Try a relaxation exercise to calm you down and decrease your stress. Remember, you may be tense and nervous for the first 2 weeks after you quit, but this will pass.  Find new activities to keep your hands busy. Play with a pen, coin, or rubber band. Doodle or draw things on paper.  Brush your teeth right after eating. This will help cut down on the craving for the taste of tobacco after meals. You can also try mouthwash.   Use oral substitutes in place of cigarettes. Try using lemon drops, carrots, cinnamon sticks, or chewing gum. Keep them handy so they are available when you have the urge to smoke.  When you have the urge to smoke, try deep breathing.  Designate your home as a nonsmoking area.  If you are a heavy smoker, ask your health care provider about a prescription for nicotine chewing gum. It can ease your withdrawal from nicotine.  Reward yourself. Set aside the cigarette money you save and buy yourself something nice.  Look for support from others. Join a support group or smoking cessation program. Ask someone at home or at work to help you with your plan   to quit smoking.  Always ask yourself, "Do I need this cigarette or is this just a reflex?" Tell yourself, "Today, I choose not to smoke," or "I do not want to smoke." You are reminding yourself of your decision to quit.  Do not replace cigarette smoking with electronic cigarettes (commonly called e-cigarettes). The safety of e-cigarettes is unknown, and some may contain harmful chemicals.  If you relapse, do not give up! Plan ahead and think about what you will do the next time you get the urge to smoke. HOW WILL I FEEL WHEN I QUIT SMOKING? You  may have symptoms of withdrawal because your body is used to nicotine (the addictive substance in cigarettes). You may crave cigarettes, be irritable, feel very hungry, cough often, get headaches, or have difficulty concentrating. The withdrawal symptoms are only temporary. They are strongest when you first quit but will go away within 10-14 days. When withdrawal symptoms occur, stay in control. Think about your reasons for quitting. Remind yourself that these are signs that your body is healing and getting used to being without cigarettes. Remember that withdrawal symptoms are easier to treat than the major diseases that smoking can cause.  Even after the withdrawal is over, expect periodic urges to smoke. However, these cravings are generally short lived and will go away whether you smoke or not. Do not smoke! WHAT RESOURCES ARE AVAILABLE TO HELP ME QUIT SMOKING? Your health care provider can direct you to community resources or hospitals for support, which may include:  Group support.  Education.  Hypnosis.  Therapy.   This information is not intended to replace advice given to you by your health care provider. Make sure you discuss any questions you have with your health care provider.   Document Released: 07/21/2004 Document Revised: 11/13/2014 Document Reviewed: 04/10/2013 Elsevier Interactive Patient Education 2016 [place] Maintenance, Male A healthy lifestyle and preventative care can promote health and wellness.  Maintain regular health, dental, and eye exams.  Eat a healthy diet. Foods like vegetables, fruits, whole grains, low-fat dairy products, and lean protein foods contain the nutrients you need and are low in calories. Decrease your intake of foods high in solid fats, added sugars, and salt. Get information about a proper diet from your health care provider, if necessary.  Regular physical exercise is one of the most important things you can do for your health.  Most adults should get at least 150 minutes of moderate-intensity exercise (any activity that increases your heart rate and causes you to sweat) each week. In addition, most adults need muscle-strengthening exercises on 2 or more days a week.   Maintain a healthy weight. The body mass index (BMI) is a screening tool to identify possible weight problems. It provides an estimate of body fat based on height and weight. Your health care provider can find your BMI and can help you achieve or maintain a healthy weight. For males 20 years and older:  A BMI below 18.5 is considered underweight.  A BMI of 18.5 to 24.9 is normal.  A BMI of 25 to 29.9 is considered overweight.  A BMI of 30 and above is considered obese.  Maintain normal blood lipids and cholesterol by exercising and minimizing your intake of saturated fat. Eat a balanced diet with plenty of fruits and vegetables. Blood tests for lipids and cholesterol should begin at age 87 and be repeated every 5 years. If your lipid or cholesterol levels are high, you are over age 34,  or you are at high risk for heart disease, you may need your cholesterol levels checked more frequently.Ongoing high lipid and cholesterol levels should be treated with medicines if diet and exercise are not working.  If you smoke, find out from your health care provider how to quit. If you do not use tobacco, do not start.  Lung cancer screening is recommended for adults aged 44-80 years who are at high risk for developing lung cancer because of a history of smoking. A yearly low-dose CT scan of the lungs is recommended for people who have at least a 30-pack-year history of smoking and are current smokers or have quit within the past 15 years. A pack year of smoking is smoking an average of 1 pack of cigarettes a day for 1 year (for example, a 30-pack-year history of smoking could mean smoking 1 pack a day for 30 years or 2 packs a day for 15 years). Yearly screening should  continue until the smoker has stopped smoking for at least 15 years. Yearly screening should be stopped for people who develop a health problem that would prevent them from having lung cancer treatment.  If you choose to drink alcohol, do not have more than 2 drinks per day. One drink is considered to be 12 oz (360 mL) of beer, 5 oz (150 mL) of wine, or 1.5 oz (45 mL) of liquor.  Avoid the use of street drugs. Do not share needles with anyone. Ask for help if you need support or instructions about stopping the use of drugs.  High blood pressure causes heart disease and increases the risk of stroke. High blood pressure is more likely to develop in:  People who have blood pressure in the end of the normal range (100-139/85-89 mm Hg).  People who are overweight or obese.  People who are African American.  If you are 91-35 years of age, have your blood pressure checked every 3-5 years. If you are 51 years of age or older, have your blood pressure checked every year. You should have your blood pressure measured twice--once when you are at a hospital or clinic, and once when you are not at a hospital or clinic. Record the average of the two measurements. To check your blood pressure when you are not at a hospital or clinic, you can use:  An automated blood pressure machine at a pharmacy.  A home blood pressure monitor.  If you are 66-32 years old, ask your health care provider if you should take aspirin to prevent heart disease.  Diabetes screening involves taking a blood sample to check your fasting blood sugar level. This should be done once every 3 years after age 35 if you are at a normal weight and without risk factors for diabetes. Testing should be considered at a younger age or be carried out more frequently if you are overweight and have at least 1 risk factor for diabetes.  Colorectal cancer can be detected and often prevented. Most routine colorectal cancer screening begins at the age of  83 and continues through age 53. However, your health care provider may recommend screening at an earlier age if you have risk factors for colon cancer. On a yearly basis, your health care provider may provide home test kits to check for hidden blood in the stool. A small camera at the end of a tube may be used to directly examine the colon (sigmoidoscopy or colonoscopy) to detect the earliest forms of colorectal cancer. Talk to  your health care provider about this at age 87 when routine screening begins. A direct exam of the colon should be repeated every 5-10 years through age 48, unless early forms of precancerous polyps or small growths are found.  People who are at an increased risk for hepatitis B should be screened for this virus. You are considered at high risk for hepatitis B if:  You were born in a country where hepatitis B occurs often. Talk with your health care provider about which countries are considered high risk.  Your parents were born in a high-risk country and you have not received a shot to protect against hepatitis B (hepatitis B vaccine).  You have HIV or AIDS.  You use needles to inject street drugs.  You live with, or have sex with, someone who has hepatitis B.  You are a man who has sex with other men (MSM).  You get hemodialysis treatment.  You take certain medicines for conditions like cancer, organ transplantation, and autoimmune conditions.  Hepatitis C blood testing is recommended for all people born from [birth year] through 1965 and any individual with known risk factors for hepatitis C.  Healthy men should no longer receive prostate-specific antigen (PSA) blood tests as part of routine cancer screening. Talk to your health care provider about prostate cancer screening.  Testicular cancer screening is not recommended for adolescents or adult males who have no symptoms. Screening includes self-exam, a health care provider exam, and other screening tests. Consult with  your health care provider about any symptoms you have or any concerns you have about testicular cancer.  Practice safe sex. Use condoms and avoid high-risk sexual practices to reduce the spread of sexually transmitted infections (STIs).  You should be screened for STIs, including gonorrhea and chlamydia if:  You are sexually active and are younger than 24 years.  You are older than 24 years, and your health care provider tells you that you are at risk for this type of infection.  Your sexual activity has changed since you were last screened, and you are at an increased risk for chlamydia or gonorrhea. Ask your health care provider if you are at risk.  If you are at risk of being infected with HIV, it is recommended that you take a prescription medicine daily to prevent HIV infection. This is called pre-exposure prophylaxis (PrEP). You are considered at risk if:  You are a man who has sex with other men (MSM).  You are a heterosexual man who is sexually active with multiple partners.  You take drugs by injection.  You are sexually active with a partner who has HIV.  Talk with your health care provider about whether you are at high risk of being infected with HIV. If you choose to begin PrEP, you should first be tested for HIV. You should then be tested every 3 months for as long as you are taking PrEP.  Use sunscreen. Apply sunscreen liberally and repeatedly throughout the day. You should seek shade when your shadow is shorter than you. Protect yourself by wearing long sleeves, pants, a wide-brimmed hat, and sunglasses year round whenever you are outdoors.  Tell your health care provider of new moles or changes in moles, especially if there is a change in shape or color. Also, tell your health care provider if a mole is larger than the size of a pencil eraser.  A one-time screening for abdominal aortic aneurysm (AAA) and surgical repair of large AAAs by ultrasound is recommended  for men  aged 26-75 years who are current or former smokers.  Stay current with your vaccines (immunizations).   This information is not intended to replace advice given to you by your health care provider. Make sure you discuss any questions you have with your health care provider.   Document Released: 04/20/2008 Document Revised: 11/13/2014 Document Reviewed: 03/20/2011 Elsevier Interactive Patient Education [employer].
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
## 2016-07-04 ENCOUNTER — Telehealth: Payer: Self-pay

## 2016-07-04 LAB — CBC WITH DIFFERENTIAL/PLATELET
Basophils Absolute: 0 10*3/uL (ref 0.0–0.2)
Basos: 0 %
EOS (ABSOLUTE): 0.3 10*3/uL (ref 0.0–0.4)
EOS: 6 %
HEMATOCRIT: 44.1 % (ref 37.5–51.0)
Hemoglobin: 15.2 g/dL (ref 12.6–17.7)
IMMATURE GRANULOCYTES: 0 %
Immature Grans (Abs): 0 10*3/uL (ref 0.0–0.1)
LYMPHS ABS: 1.2 10*3/uL (ref 0.7–3.1)
Lymphs: 25 %
MCH: 31.3 pg (ref 26.6–33.0)
MCHC: 34.5 g/dL (ref 31.5–35.7)
MCV: 91 fL (ref 79–97)
MONOS ABS: 0.6 10*3/uL (ref 0.1–0.9)
Monocytes: 12 %
NEUTROS PCT: 57 %
Neutrophils Absolute: 2.6 10*3/uL (ref 1.4–7.0)
PLATELETS: 178 10*3/uL (ref 150–379)
RBC: 4.85 x10E6/uL (ref 4.14–5.80)
RDW: 12.8 % (ref 12.3–15.4)
WBC: 4.6 10*3/uL (ref 3.4–10.8)

## 2016-07-04 LAB — COMPREHENSIVE METABOLIC PANEL
A/G RATIO: 1.4 (ref 1.2–2.2)
ALK PHOS: 66 IU/L (ref 39–117)
ALT: 16 IU/L (ref 0–44)
AST: 13 IU/L (ref 0–40)
Albumin: 3.9 g/dL (ref 3.6–4.8)
BUN/Creatinine Ratio: 10 (ref 10–24)
BUN: 9 mg/dL (ref 8–27)
Bilirubin Total: 0.3 mg/dL (ref 0.0–1.2)
CO2: 24 mmol/L (ref 18–29)
CREATININE: 0.86 mg/dL (ref 0.76–1.27)
Calcium: 8.9 mg/dL (ref 8.6–10.2)
Chloride: 103 mmol/L (ref 96–106)
GFR calc Af Amer: 108 mL/min/{1.73_m2} (ref >59)
GFR calc non Af Amer: 94 mL/min/{1.73_m2} (ref >59)
GLOBULIN, TOTAL: 2.7 g/dL (ref 1.5–4.5)
Glucose: 89 mg/dL (ref 65–99)
POTASSIUM: 4.5 mmol/L (ref 3.5–5.2)
SODIUM: 140 mmol/L (ref 134–144)
Total Protein: 6.6 g/dL (ref 6.0–8.5)

## 2016-07-04 LAB — PSA: PROSTATE SPECIFIC AG, SERUM: 1.3 ng/mL (ref 0.0–4.0)

## 2016-07-04 LAB — HIV ANTIBODY (ROUTINE TESTING W REFLEX): HIV SCREEN 4TH GENERATION: NONREACTIVE

## 2016-07-04 LAB — TSH: TSH: 1 u[IU]/mL (ref 0.450–4.500)

## 2016-07-04 LAB — HEPATITIS C ANTIBODY

## 2016-07-04 NOTE — Telephone Encounter (Signed)
-----   Message from Margo Common, [place] sent at 07/04/2016  1:41 PM EDT ----- All blood tests are normal. Recheck BP in 3 months.

## 2016-07-04 NOTE — Telephone Encounter (Signed)
LMTCB

## 2016-07-04 NOTE — Telephone Encounter (Signed)
-----   Message from Margo Common, [place] sent at 07/03/2016  5:32 PM EDT ----- Normal CBC. Remainder of blood tests to follow.

## 2016-07-05 NOTE — Telephone Encounter (Signed)
LMTCB

## 2016-07-05 NOTE — Telephone Encounter (Signed)
Patient advised.

## 2016-09-12 ENCOUNTER — Encounter: Payer: BLUE CROSS/BLUE SHIELD | Admitting: Family Medicine

## 2016-09-14 ENCOUNTER — Ambulatory Visit (INDEPENDENT_AMBULATORY_CARE_PROVIDER_SITE_OTHER): Payer: BLUE CROSS/BLUE SHIELD | Admitting: Family Medicine

## 2016-09-14 ENCOUNTER — Encounter: Payer: Self-pay | Admitting: Family Medicine

## 2016-09-14 VITALS — BP 152/94 | HR 94 | Temp 98.2°F | Resp 18 | Wt 186.6 lb

## 2016-09-14 DIAGNOSIS — Z23 Encounter for immunization: Secondary | ICD-10-CM

## 2016-09-14 DIAGNOSIS — M1811 Unilateral primary osteoarthritis of first carpometacarpal joint, right hand: Secondary | ICD-10-CM | POA: Diagnosis not present

## 2016-09-14 DIAGNOSIS — M7711 Lateral epicondylitis, right elbow: Secondary | ICD-10-CM | POA: Diagnosis not present

## 2016-09-14 MED ORDER — PREDNISONE 10 MG PO TABS: 10.0000 mg | ORAL_TABLET | Freq: Every day | ORAL | 0 refills | Status: DC

## 2016-09-14 NOTE — Progress Notes (Signed)
Patient: Joe Dillon Male    DOB: 04/07/1956   60 y.o.   MRN: [id number] Visit Date: 09/14/2016  Today's Provider: Vernie Murders, PA   Chief Complaint  Patient presents with  . Arthritis  . Follow-up   Subjective:    HPI Patient is here today to follow up right thumb and right elbow pain. Patient reports thumb pain started 3 years ago, and elbow pain started a couple months ago. Patient has been evaluated by a orthopedist in [place]. He was diagnosed with arthritis and tennis elbow. Patient complains of increasing pain and not able to sleep at night due to pain.   Past Medical History:  Diagnosis Date  . Chronic cough   . Hemorrhoids, thrombosed 05/16/2010  . ST segment depression   . Tendinitis of elbow    Past Surgical History:  Procedure Laterality Date  . INGUINAL HERNIA REPAIR Right 2000   Right Inguilal Herniorrhaphy   . INGUINAL HERNIA REPAIR Left 2001   Left Inguinal Herniorrhaphy   Family History  Problem Relation Age of Onset  . Alcohol abuse Maternal Grandfather    No Known Allergies   Previous Medications   ACETAMINOPHEN (TYLENOL) 325 MG TABLET    Take 650 mg by mouth every 6 (six) hours as needed. Reported on 10/28/2015   BUDESONIDE-FORMOTEROL (SYMBICORT) 160-4.5 MCG/ACT INHALER    Inhale 2 puffs into the lungs 2 (two) times daily.   LISINOPRIL (PRINIVIL,ZESTRIL) 20 MG TABLET    Take 1 tablet (20 mg total) by mouth daily.    Review of Systems  Constitutional: Negative.   Respiratory: Negative.   Cardiovascular: Negative.   Musculoskeletal: Positive for arthralgias.    Social History  Substance Use Topics  . Smoking status: Current Every Day Smoker    Packs/day: 1.00    Years: 39.00    Types: Cigarettes  . Smokeless tobacco: Current User    Types: Chew  . Alcohol use 0.0 oz/week     Comment: occaionally   Objective:   BP (!) 152/94 (BP Location: Right Arm, Patient Position: Sitting, Cuff Size: Normal)   Pulse 94   Temp 98.2 F (36.8  C) (Oral)   Resp 18   Wt 186 lb 9.6 oz (84.6 kg)   BMI 26.77 kg/m   Physical Exam  Constitutional: He is oriented to person, place, and time. He appears well-developed and well-nourished. No distress.  HENT:  Head: Normocephalic and atraumatic.  Right Ear: Hearing normal.  Left Ear: Hearing normal.  Nose: Nose normal.  Eyes: Conjunctivae and lids are normal. Right eye exhibits no discharge. Left eye exhibits no discharge. No scleral icterus.  Neck: Neck supple.  Pulmonary/Chest: Effort normal. No respiratory distress.  Abdominal: Bowel sounds are normal.  Musculoskeletal: He exhibits tenderness.  Tender along the right lateral elbow epicondyle and base of the radial side of thumb at metacarpal joint.  Neurological: He is alert and oriented to person, place, and time.  Skin: Skin is intact. No lesion and no rash noted.  Psychiatric: He has a normal mood and affect. His speech is normal and behavior is normal. Thought content normal.      Assessment & Plan:     1. Degenerative arthritis of thumb, right Onset 3 years ago. Recent flare was evaluated by orthopedist in [place]. Patient states x-rays confirmed arthritis. Flare recently with masonry/tile work. Discomfort not adequately controlled with Tylenol and disturbing sleep. Unable to pick up a 12 ounce cup of coffee without grip weakness and pain.  Not much relief from Tylenol. Will give prednisone taper and if no better in 10-14 days, will need to consider recheck with orthopedist for cortisone injections. - predniSONE (DELTASONE) 10 MG tablet; Take 1 tablet (10 mg total) by mouth daily with breakfast. Taper dosage daily by one tablet (6,5,4,3,2,1)  Dispense: 21 tablet; Refill: 0  2. Right lateral epicondylitis Recurrence over the past couple months. Flares with tile/masonry work. Some help with tennis elbow strap. Will give prednisone taper and may need follow up with orthopedist if no better in 10-14 days. Use ice massage and  recheck prn. - predniSONE (DELTASONE) 10 MG tablet; Take 1 tablet (10 mg total) by mouth daily with breakfast. Taper dosage daily by one tablet (6,5,4,3,2,1)  Dispense: 21 tablet; Refill: 0  3. Need for influenza vaccination - Flu Vaccine QUAD 36+ mos PF IM (Fluarix & Fluzone Quad PF)

## 2016-09-14 NOTE — Patient Instructions (Signed)
Tennis Elbow Tennis elbow (lateral epicondylitis) is inflammation of the outer tendons of your forearm close to your elbow. Your tendons attach your muscles to your bones. The outer tendons of your forearm are used to extend your wrist, and they attach on the outside part of your elbow. Tennis elbow is often found in people who play tennis, but anyone may get the condition from repeatedly extending the wrist or turning the forearm. CAUSES This condition is caused by repeatedly extending your wrist and using your hands. It can result from sports or work that requires repetitive forearm movements. Tennis elbow may also be caused by an injury. RISK FACTORS You have a higher risk of developing tennis elbow if you play tennis or another racquet sport. You also have a higher risk if you frequently use your hands for work. This condition is also more likely to develop in:  Musicians.  Carpenters, painters, and plumbers.  Cooks.  Cashiers.  People who work in factories.  Construction workers.  Butchers.  People who use computers. SYMPTOMS Symptoms of this condition include:  Pain and tenderness in your forearm and the outer part of your elbow. You may only feel the pain when you use your arm, or you may feel it even when you are not using your arm.  A burning feeling that runs from your elbow through your arm.  Weak grip in your hands. DIAGNOSIS  This condition may be diagnosed by medical history and physical exam. You may also have other tests, including:  X-rays.  MRI. TREATMENT Your health care provider will recommend lifestyle adjustments, such as resting and icing your arm. Treatment may also include:  Medicines for inflammation. This may include shots of cortisone if your pain continues.  Physical therapy. This may include massage or exercises.  An elbow brace. Surgery may eventually be recommended if your pain does not go away with treatment. HOME CARE  INSTRUCTIONS Activity  Rest your elbow and wrist as directed by your health care provider. Try to avoid any activities that caused the problem until your health care provider says that you can do them again.  If a physical therapist teaches you exercises, do all of them as directed.  If you lift an object, lift it with your palm facing upward. This lowers the stress on your elbow. Lifestyle  If your tennis elbow is caused by sports, check your equipment and make sure that:  You are using it correctly.  It is the best fit for you.  If your tennis elbow is caused by work, take breaks frequently, if you are able. Talk with your manager about how to best perform tasks in a way that is safe.  If your tennis elbow is caused by computer use, talk with your manager about any changes that can be made to your work environment. General Instructions  If directed, apply ice to the painful area:  Put ice in a plastic bag.  Place a towel between your skin and the bag.  Leave the ice on for 20 minutes, 2-3 times per day.  Take medicines only as directed by your health care provider.  If you were given a brace, wear it as directed by your health care provider.  Keep all follow-up visits as directed by your health care provider. This is important. SEEK MEDICAL CARE IF:  Your pain does not get better with treatment.  Your pain gets worse.  You have numbness or weakness in your forearm, hand, or fingers.     This information is not intended to replace advice given to you by your health care provider. Make sure you discuss any questions you have with your health care provider.   Document Released: 10/23/2005 Document Revised: 03/09/2015 Document Reviewed: 10/19/2014 Elsevier Interactive Patient Education 2016 [place] is a term that is commonly used to refer to joint pain or joint disease. There are more than 100 types of arthritis. CAUSES The most common cause of  this condition is wear and tear of a joint. Other causes include:  Gout.  Inflammation of a joint.  An infection of a joint.  Sprains and other injuries near the joint.  A drug reaction or allergic reaction. In some cases, the cause may not be known. SYMPTOMS The main symptom of this condition is pain in the joint with movement. Other symptoms include:  Redness, swelling, or stiffness at a joint.  Warmth coming from the joint.  Fever.  Overall feeling of illness. DIAGNOSIS This condition may be diagnosed with a physical exam and tests, including:  Blood tests.  Urine tests.  Imaging tests, such as MRI, X-rays, or a CT scan. Sometimes, fluid is removed from a joint for testing. TREATMENT Treatment for this condition may involve:  Treatment of the cause, if it is known.  Rest.  Raising (elevating) the joint.  Applying cold or hot packs to the joint.  Medicines to improve symptoms and reduce inflammation.  Injections of a steroid such as cortisone into the joint to help reduce pain and inflammation. Depending on the cause of your arthritis, you may need to make lifestyle changes to reduce stress on your joint. These changes may include exercising more and losing weight. HOME CARE INSTRUCTIONS Medicines  Take over-the-counter and prescription medicines only as told by your health care provider.  Do not take aspirin to relieve pain if gout is suspected. Activities  Rest your joint if told by your health care provider. Rest is important when your disease is active and your joint feels painful, swollen, or stiff.  Avoid activities that make the pain worse. It is important to balance activity with rest.  Exercise your joint regularly with range-of-motion exercises as told by your health care provider. Try doing low-impact exercise, such as:  Swimming.  Water aerobics.  Biking.  Walking. Joint Care  If your joint is swollen, keep it elevated if told by your  health care provider.  If your joint feels stiff in the morning, try taking a warm shower.  If directed, apply heat to the joint. If you have diabetes, do not apply heat without permission from your health care provider.  Put a towel between the joint and the hot pack or heating pad.  Leave the heat on the area for 20-30 minutes.  If directed, apply ice to the joint:  Put ice in a plastic bag.  Place a towel between your skin and the bag.  Leave the ice on for 20 minutes, 2-3 times per day.  Keep all follow-up visits as told by your health care provider. This is important. SEEK MEDICAL CARE IF:  The pain gets worse.  You have a fever. SEEK IMMEDIATE MEDICAL CARE IF:  You develop severe joint pain, swelling, or redness.  Many joints become painful and swollen.  You develop severe back pain.  You develop severe weakness in your leg.  You cannot control your bladder or bowels.   This information is not intended to replace advice given to you by your health  care provider. Make sure you discuss any questions you have with your health care provider.   Document Released: 11/30/2004 Document Revised: 07/14/2015 Document Reviewed: 01/18/2015 Elsevier Interactive Patient Education [employer].

## 2016-10-09 ENCOUNTER — Telehealth (INDEPENDENT_AMBULATORY_CARE_PROVIDER_SITE_OTHER): Payer: Self-pay | Admitting: *Deleted

## 2016-10-09 NOTE — Telephone Encounter (Signed)
Patient has appt in [place] office on 10/12/2016

## 2016-10-09 NOTE — Telephone Encounter (Signed)
Pt wife called stating pts elbow is still hurting and doesn't know to make a regular appt or if he could just get an injection. Pt wife requesting call back [phone].

## 2016-10-12 ENCOUNTER — Ambulatory Visit (INDEPENDENT_AMBULATORY_CARE_PROVIDER_SITE_OTHER): Payer: BLUE CROSS/BLUE SHIELD | Admitting: Orthopaedic Surgery

## 2016-10-12 ENCOUNTER — Encounter (INDEPENDENT_AMBULATORY_CARE_PROVIDER_SITE_OTHER): Payer: Self-pay | Admitting: Orthopaedic Surgery

## 2016-10-12 VITALS — Ht 70.0 in | Wt 185.0 lb

## 2016-10-12 DIAGNOSIS — M7711 Lateral epicondylitis, right elbow: Secondary | ICD-10-CM | POA: Diagnosis not present

## 2016-10-12 MED ORDER — BUPIVACAINE HCL 0.5 % IJ SOLN
1.0000 mL | INTRAMUSCULAR | Status: AC | PRN
  Administered 2016-10-12: 1 mL via INTRA_ARTICULAR

## 2016-10-12 MED ORDER — METHYLPREDNISOLONE ACETATE 40 MG/ML IJ SUSP
40.0000 mg | INTRAMUSCULAR | Status: AC | PRN
  Administered 2016-10-12: 40 mg via INTRA_ARTICULAR

## 2016-10-12 MED ORDER — LIDOCAINE HCL 1 % IJ SOLN
0.5000 mL | INTRAMUSCULAR | Status: AC | PRN
  Administered 2016-10-12: .5 mL

## 2016-10-12 NOTE — Progress Notes (Signed)
Office Visit Note   Patient: Joe Dillon           Date of Birth: May 18, 1956           MRN: [id number] Visit Date: 10/12/2016              Requested by: Margo [address], [place], [place] [address] PCP: Vernie Murders, PA   Assessment & Plan: Visit Diagnoses:  1. Lateral epicondylitis, right elbow     Plan: Bilateral epicondylar injection performed. Get good relief. If he has persistent problems he can return. We discussed the [facility] degenerative changes.  Follow-Up Instructions: Return if symptoms worsen or fail to improve.   Orders:  Orders Placed This Encounter  Procedures  . Medium Joint Injection/Arthrocentesis   No orders of the defined types were placed in this encounter.     Procedures: Medium Joint Inj Date/Time: 10/12/2016 9:54 AM Performed by: Marybelle Killings Authorized by: Rodell Perna C   Location:  Elbow Site:  R lateral epicondyle Approach:  Dorsal Ultrasound Guided: No   Fluoroscopic Guidance: No   Medications:  0.5 mL lidocaine 1 %; 40 mg methylPREDNISolone acetate 40 MG/ML; 1 mL bupivacaine 0.5 % Aspiration Attempted: No       Clinical Data: No additional findings.   Subjective: Chief Complaint  Patient presents with  . Right Elbow - Pain  . Right Thumb - Pain    Patient returns with right elbow and right thumb pain. He is requesting injections today.  He states that he had x-rays about a month ago. Since then, he has seen his PCP and was put on Prednisone with relief. He is wearing a tennis elbow strap with some relief. He states that he has to lay his arm in a certain position at night just to be able to go to sleep. He does take Tylenol prn.    Review of Systems  Constitutional: Negative for chills and diaphoresis.  HENT: Negative for ear discharge, ear pain and nosebleeds.   Eyes: Negative for discharge and visual disturbance.  Respiratory: Negative for cough, choking and shortness of breath.     Cardiovascular: Negative for chest pain and palpitations.  Gastrointestinal: Negative for abdominal distention and abdominal pain.  Endocrine: Negative for cold intolerance and heat intolerance.  Genitourinary: Negative for flank pain and hematuria.  Musculoskeletal:       Chronic pain right elbow he's been using a tennis elbow strap. Patient base of thumb particularly squeezing diastolic also likes to play golf.  Skin: Negative for rash and wound.  Neurological: Negative for seizures and speech difficulty.  Hematological: Negative for adenopathy. Does not bruise/bleed easily.  Psychiatric/Behavioral: Negative for agitation and suicidal ideas.     Objective: Vital Signs: Ht 5\' 10"  (1.778 m)   Wt 185 lb (83.9 kg)   BMI 26.54 kg/m   Physical Exam  Constitutional: He is oriented to person, place, and time. He appears well-developed and well-nourished.  HENT:  Head: Normocephalic and atraumatic.  Eyes: EOM are normal. Pupils are equal, round, and reactive to light.  Neck: No tracheal deviation present. No thyromegaly present.  Cardiovascular: Normal rate.   Pulmonary/Chest: Effort normal. He has no wheezes.  Abdominal: Soft. Bowel sounds are normal.  Musculoskeletal:  Patient's right elbow lateral, tenderness pain with resisted wrist extension. Thumb CMC degenerative changes positive grind test negative Finkelstein test L exam is normal good cervical range of motion.  Neurological: He is alert and oriented to person, place,  and time.  Skin: Skin is warm and dry. Capillary refill takes less than 2 seconds.  Psychiatric: He has a normal mood and affect. His behavior is normal. Judgment and thought content normal.    Ortho Exam reflexes are 2+ there's exquisite tenderness right lateral upper condyle. Pins were resisted wrist extension he's been using a tennis elbow brace even asleep states can't sleep without it. Increased pain with gripping. Pain at the base of the thumb A1 pulley is  normal. Collateral ligaments MP joint are normal.  Specialty Comments:  No specialty comments available.  Imaging: No results found.   PMFS History: Patient Active Problem List   Diagnosis Date Noted  . Mixed simple and mucopurulent chronic bronchitis ([place]) 10/28/2015  . Chronic cough 05/25/2015  . Backhand tennis elbow 05/25/2015  . Snores 05/25/2015  . ST segment depression 05/25/2015  . Synovitis and tenosynovitis 05/25/2015  . Tenosynovitis of hand 05/25/2015  . Irregular cardiac rhythm 12/16/2014  . Abnormal ECG 09/11/2014  . Benign essential HTN 09/11/2014  . Chest pain 09/11/2014  . Fatigue 09/11/2014  . Combined fat and carbohydrate induced hyperlipemia 09/11/2014  . Breathlessness on exertion 09/11/2014  . Current tobacco use 09/11/2014  . Thrombosed hemorrhoids 05/16/2010  . Blood in feces 02/24/2010   Past Medical History:  Diagnosis Date  . Chronic cough   . Hemorrhoids, thrombosed 05/16/2010  . ST segment depression   . Tendinitis of elbow     Family History  Problem Relation Age of Onset  . Alcohol abuse Maternal Grandfather     Past Surgical History:  Procedure Laterality Date  . INGUINAL HERNIA REPAIR Right 2000   Right Inguilal Herniorrhaphy   . INGUINAL HERNIA REPAIR Left 2001   Left Inguinal Herniorrhaphy   Social History   Occupational History  . Not on file.   Social History Main Topics  . Smoking status: Current Every Day Smoker    Packs/day: 1.00    Years: 39.00    Types: Cigarettes  . Smokeless tobacco: Current User    Types: Chew  . Alcohol use 0.0 oz/week     Comment: occaionally  . Drug use: No  . Sexual activity: Not on file

## 2016-11-02 ENCOUNTER — Encounter (INDEPENDENT_AMBULATORY_CARE_PROVIDER_SITE_OTHER): Payer: Self-pay | Admitting: Orthopaedic Surgery

## 2016-11-02 ENCOUNTER — Ambulatory Visit (INDEPENDENT_AMBULATORY_CARE_PROVIDER_SITE_OTHER): Payer: BLUE CROSS/BLUE SHIELD | Admitting: Orthopaedic Surgery

## 2016-11-02 VITALS — BP 139/88 | HR 71 | Ht 70.0 in | Wt 185.0 lb

## 2016-11-02 DIAGNOSIS — M19031 Primary osteoarthritis, right wrist: Secondary | ICD-10-CM

## 2016-11-02 MED ORDER — BUPIVACAINE HCL 0.5 % IJ SOLN
1.0000 mL | INTRAMUSCULAR | Status: AC | PRN
  Administered 2016-11-02: 1 mL via INTRA_ARTICULAR

## 2016-11-02 MED ORDER — METHYLPREDNISOLONE ACETATE 40 MG/ML IJ SUSP
40.0000 mg | INTRAMUSCULAR | Status: AC | PRN
  Administered 2016-11-02: 40 mg via INTRA_ARTICULAR

## 2016-11-02 MED ORDER — LIDOCAINE HCL 1 % IJ SOLN
0.3000 mL | INTRAMUSCULAR | Status: AC | PRN
  Administered 2016-11-02: .3 mL

## 2016-11-02 NOTE — Progress Notes (Signed)
Office Visit Note   Patient: Joe Dillon           Date of Birth: 09-Apr-1956           MRN: [id number] Visit Date: 11/02/2016              Requested by: Margo [address], [address] [place], [place] [address] PCP: Vernie Murders, PA   Assessment & Plan: Visit Diagnoses:  1. Localized primary osteoarthrosis of carpometacarpal joint of right wrist     Plan: Intra-articular injection performed first [facility] joint right hand  Follow-Up Instructions: Return if symptoms worsen or fail to improve.   Orders:  No orders of the defined types were placed in this encounter.  No orders of the defined types were placed in this encounter.     Procedures: Small Joint Inj Date/Time: 11/02/2016 4:58 PM Performed by: Marybelle Killings Authorized by: Marybelle Killings   Location:  Thumb Site:  R thumb CMC Needle Size:  25 G Spinal Needle: No   Ultrasound Guided: No   Fluoroscopic Guidance: No   Medications:  0.3 mL lidocaine 1 %; 1 mL bupivacaine 0.5 %; 40 mg methylPREDNISolone acetate 40 MG/ML     Clinical Data: No additional findings.   Subjective: Chief Complaint  Patient presents with  . Right Thumb - Pain    Patient comes in with right thumb pain at the [facility] joint. He states that the pain has kept him up at night for the last 2-3 months. He has been having the pain x 1 year.  He had x-rays made on 07/26/2016. He had a shot in his right elbow about 2-3 weeks ago that helped a lot.    Review of Systems  Constitutional: Negative for chills and diaphoresis.  HENT: Negative for ear discharge, ear pain and nosebleeds.   Eyes: Negative for discharge and visual disturbance.  Respiratory: Negative for cough, choking and shortness of breath.   Cardiovascular: Negative for chest pain and palpitations.  Gastrointestinal: Negative for abdominal distention and abdominal pain.  Endocrine: Negative for cold intolerance and heat intolerance.  Genitourinary: Negative for flank  pain and hematuria.  Musculoskeletal:       Pre-pain with gripping and squeezing the base of the thumb. Easily tiled for 30+ years  Skin: Negative for rash and wound.  Neurological: Negative for seizures and speech difficulty.  Hematological: Negative for adenopathy. Does not bruise/bleed easily.  Psychiatric/Behavioral: Negative for agitation and suicidal ideas.     Objective: Vital Signs: BP 139/88   Pulse 71   Ht 5\' 10"  (1.778 m)   Wt 185 lb (83.9 kg)   BMI 26.54 kg/m   Physical Exam  Constitutional: He is oriented to person, place, and time. He appears well-developed and well-nourished.  HENT:  Head: Normocephalic and atraumatic.  Eyes: EOM are normal. Pupils are equal, round, and reactive to light.  Neck: No tracheal deviation present. No thyromegaly present.  Cardiovascular: Normal rate.   Pulmonary/Chest: Effort normal. He has no wheezes.  Abdominal: Soft. Bowel sounds are normal.  Musculoskeletal:  Tenderness of the first [facility] joint positive grind test. Positive pinch test negative Froment's sign. No triggering or tenderness over the A1 pulley. Sensation of the thumb is intact carpal canal is nontender negative Phalen's test. Good cervical range of motion. [place] there are intact skin is normal. Other fingers MCP wrist joint is normal no tenderness over the snuffbox  Neurological: He is alert and oriented to person, place, and  time.  Skin: Skin is warm and dry. Capillary refill takes less than 2 seconds.  Psychiatric: He has a normal mood and affect. His behavior is normal. Judgment and thought content normal.    Ortho Exam  Specialty Comments:  No specialty comments available.  Imaging: No results found. Previous x-rays show 30% subluxation CMC joint with loss of joint space and small spurring  PMFS History: Patient Active Problem List   Diagnosis Date Noted  . Localized primary osteoarthrosis of carpometacarpal joint of right wrist 11/02/2016  . Mixed simple and  mucopurulent chronic bronchitis ([place]) 10/28/2015  . Chronic cough 05/25/2015  . Backhand tennis elbow 05/25/2015  . Snores 05/25/2015  . ST segment depression 05/25/2015  . Synovitis and tenosynovitis 05/25/2015  . Tenosynovitis of hand 05/25/2015  . Irregular cardiac rhythm 12/16/2014  . Abnormal ECG 09/11/2014  . Benign essential HTN 09/11/2014  . Chest pain 09/11/2014  . Fatigue 09/11/2014  . Combined fat and carbohydrate induced hyperlipemia 09/11/2014  . Breathlessness on exertion 09/11/2014  . Current tobacco use 09/11/2014  . Thrombosed hemorrhoids 05/16/2010  . Blood in feces 02/24/2010   Past Medical History:  Diagnosis Date  . Chronic cough   . Hemorrhoids, thrombosed 05/16/2010  . ST segment depression   . Tendinitis of elbow     Family History  Problem Relation Age of Onset  . Alcohol abuse Maternal Grandfather     Past Surgical History:  Procedure Laterality Date  . INGUINAL HERNIA REPAIR Right 2000   Right Inguilal Herniorrhaphy   . INGUINAL HERNIA REPAIR Left 2001   Left Inguinal Herniorrhaphy   Social History   Occupational History  . Not on file.   Social History Main Topics  . Smoking status: Current Every Day Smoker    Packs/day: 1.00    Years: 39.00    Types: Cigarettes  . Smokeless tobacco: Current User    Types: Chew  . Alcohol use 0.0 oz/week     Comment: occaionally  . Drug use: No  . Sexual activity: Not on file

## 2016-12-11 IMAGING — CR DG CHEST 2V
1 series · 2 of 2 positions shown · non-contrast
Comparison: 10/14/2015

CLINICAL DATA: Productive cough 2-3 weeks.

EXAM:
CHEST  2 VIEW

[Series 1: dg chest 2 view · 0.14mm/px · 2 of 2 slices shown]
[im 1/2]
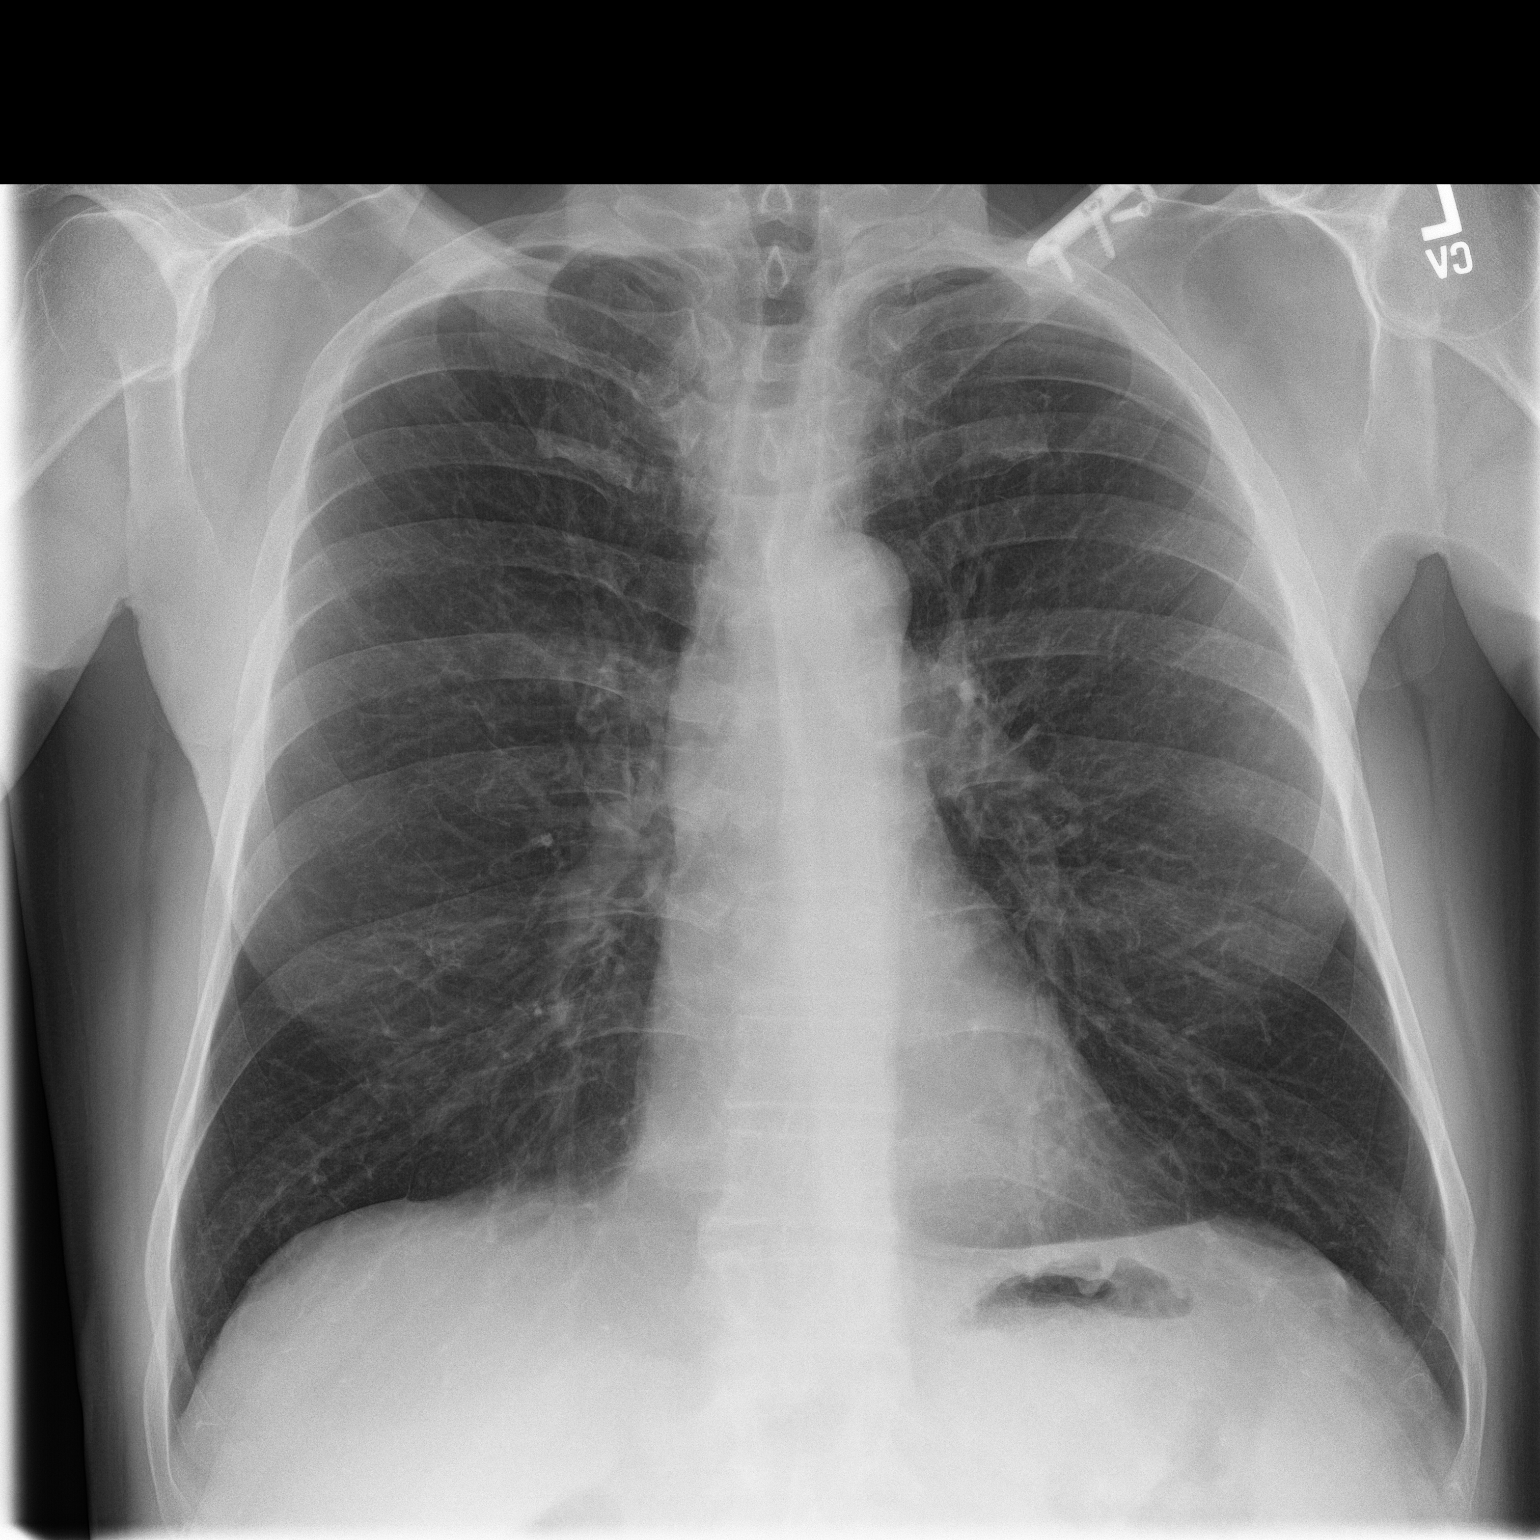
[im 2/2]
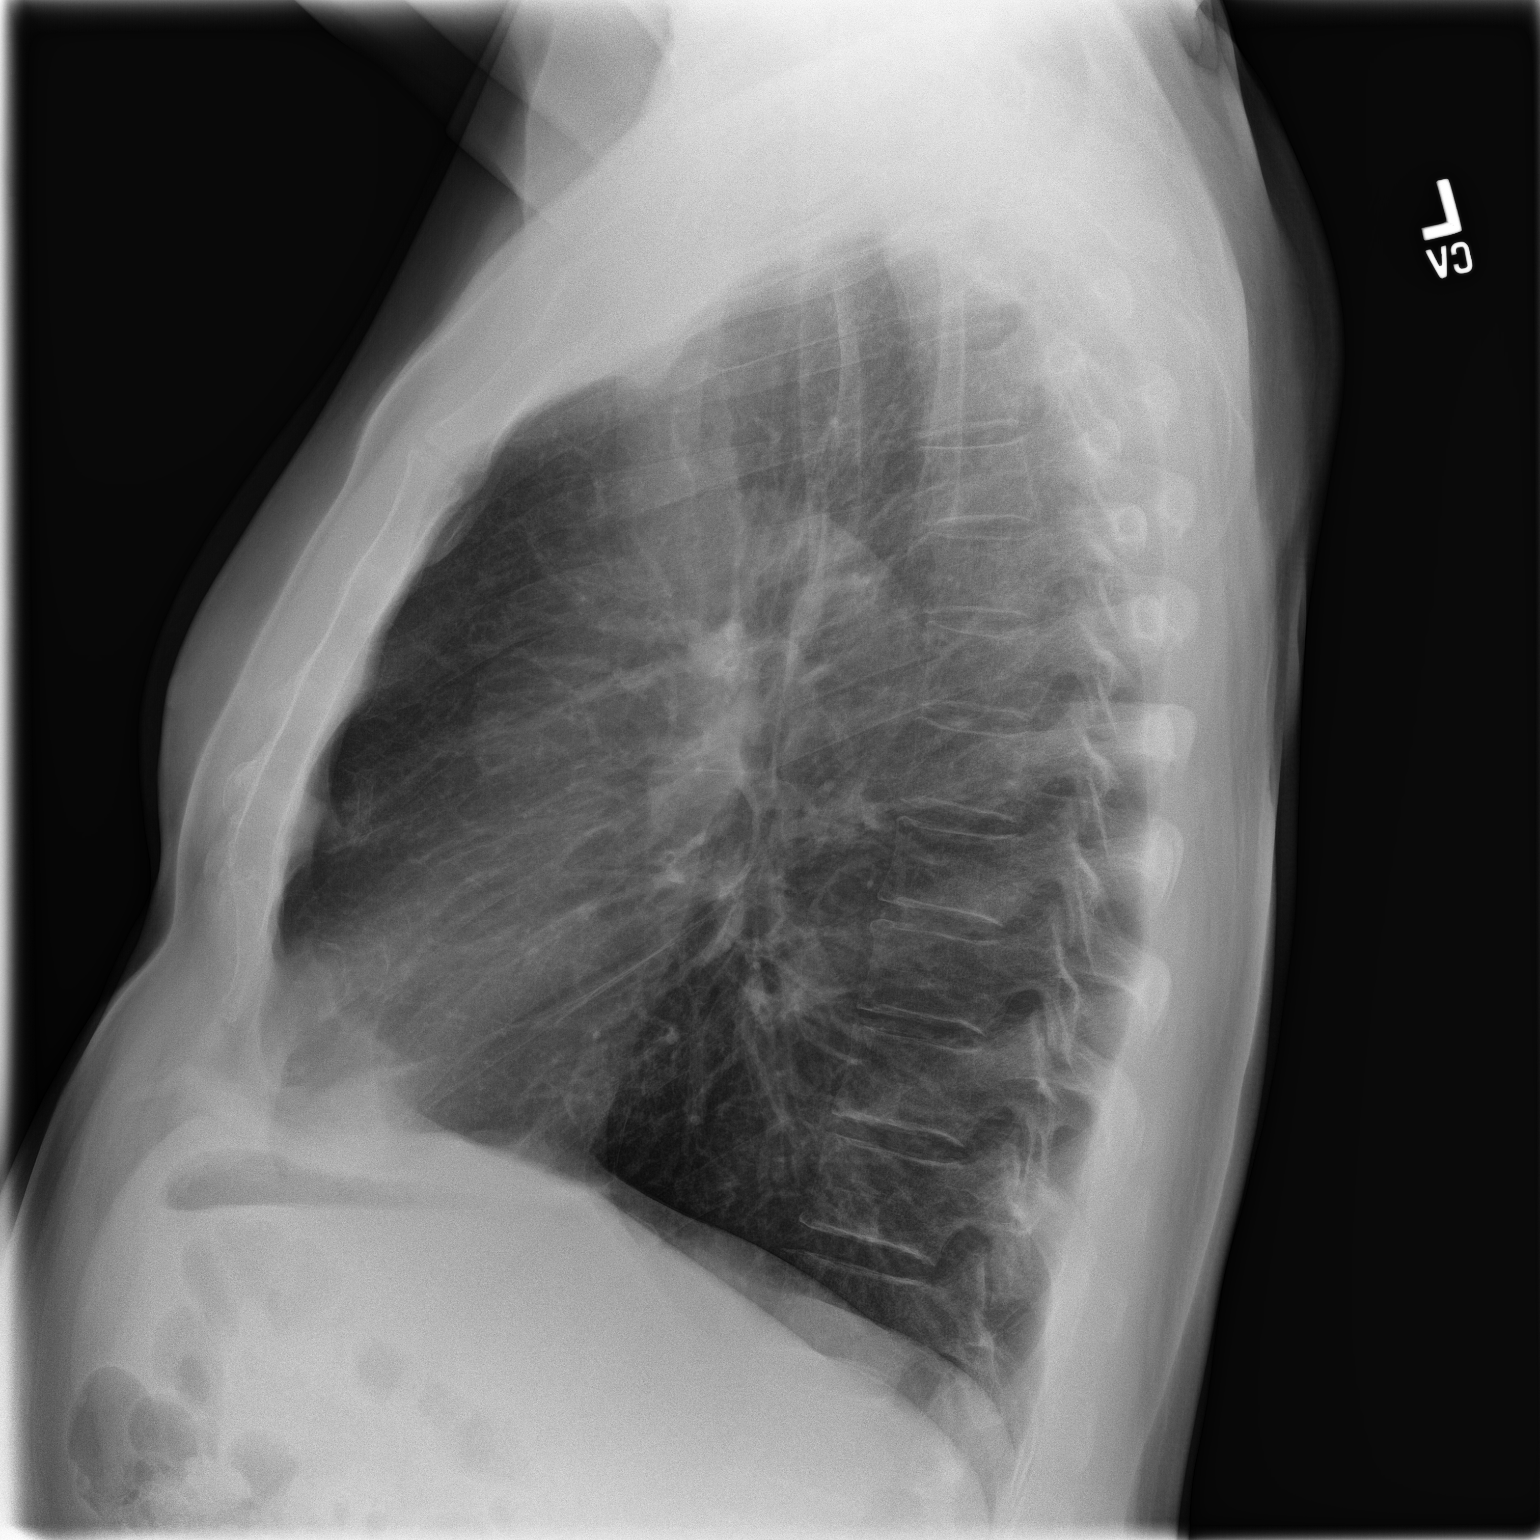

[2 of 2 positions shown; findings below may reference images not displayed]

FINDINGS: There is no focal parenchymal opacity. There is no pleural effusion
or pneumothorax. The heart and mediastinal contours are
unremarkable.

Old left clavicular fracture transfixed with a sideplate and
multiple interlocking screws.
IMPRESSION: No active cardiopulmonary disease.

## 2016-12-13 ENCOUNTER — Telehealth: Payer: Self-pay | Admitting: Physician Assistant

## 2016-12-13 NOTE — Telephone Encounter (Signed)
Patient's wife Jackelyn Poling advised.

## 2016-12-13 NOTE — Telephone Encounter (Signed)
I would check with the pharmaceutical company to see if they have a prescription assistant program. Most do. That is probably best option.

## 2016-12-13 NOTE — Telephone Encounter (Signed)
Pt's wife Joe Dillon stated they no longer having Rx insurance and the budesonide-formoterol (SYMBICORT) 160-4.5 MCG/ACT inhaler is very expensive. [name] wanted to know if we had a coupon or new of a program they might could look into for possibly getting Rx discount or for free. Please advise. Thanks TNP

## 2017-01-15 ENCOUNTER — Telehealth: Payer: Self-pay

## 2017-01-15 NOTE — Telephone Encounter (Signed)
Pt called reporting his Lisinopril is causing a cough.  He would like to try a different medications.  Please send rx to [facility].   Thanks,   -Mickel Baas

## 2017-01-16 ENCOUNTER — Other Ambulatory Visit: Payer: Self-pay | Admitting: Physician Assistant

## 2017-01-16 DIAGNOSIS — I10 Essential (primary) hypertension: Secondary | ICD-10-CM

## 2017-01-16 NOTE — Telephone Encounter (Signed)
Schedule appointment to check lungs and BP to help decide what a good option would be.

## 2017-01-16 NOTE — Telephone Encounter (Signed)
I know patient was wanting to change and I think you are following up with him over that.

## 2017-01-16 NOTE — Telephone Encounter (Signed)
Patient scheduling a follow up appointment to be sure he does not have a pulmonary issue (heavy smoker in the past) and decide on an appropriate option to control BP.

## 2017-01-16 NOTE — Telephone Encounter (Signed)
Patient advised. He states he will call back to schedule a follow up appointment.  °

## 2017-02-18 ENCOUNTER — Other Ambulatory Visit: Payer: Self-pay | Admitting: Physician Assistant

## 2017-02-18 DIAGNOSIS — I10 Essential (primary) hypertension: Secondary | ICD-10-CM

## 2017-02-19 ENCOUNTER — Encounter: Payer: Self-pay | Admitting: Family Medicine

## 2017-02-19 ENCOUNTER — Ambulatory Visit (INDEPENDENT_AMBULATORY_CARE_PROVIDER_SITE_OTHER): Payer: Self-pay | Admitting: Family Medicine

## 2017-02-19 VITALS — BP 132/78 | HR 71 | Temp 98.3°F | Wt 186.4 lb

## 2017-02-19 DIAGNOSIS — M7711 Lateral epicondylitis, right elbow: Secondary | ICD-10-CM

## 2017-02-19 MED ORDER — PREDNISONE 10 MG PO TABS: 10.0000 mg | ORAL_TABLET | Freq: Every day | ORAL | 0 refills | Status: DC

## 2017-02-19 NOTE — Patient Instructions (Signed)
Tennis Elbow Tennis elbow (lateral epicondylitis) is inflammation of the outer tendons of your forearm close to your elbow. Your tendons attach your muscles to your bones. The outer tendons of your forearm are used to extend your wrist, and they attach on the outside part of your elbow. Tennis elbow is often found in people who play tennis, but anyone may get the condition from repeatedly extending the wrist or turning the forearm. What are the causes? This condition is caused by repeatedly extending your wrist and using your hands. It can result from sports or work that requires repetitive forearm movements. Tennis elbow may also be caused by an injury. What increases the risk? You have a higher risk of developing tennis elbow if you play tennis or another racquet sport. You also have a higher risk if you frequently use your hands for work. This condition is also more likely to develop in:  Musicians.  Carpenters, painters, and plumbers.  Cooks.  Cashiers.  People who work in factories.  Construction workers.  Butchers.  People who use computers. What are the signs or symptoms? Symptoms of this condition include:  Pain and tenderness in your forearm and the outer part of your elbow. You may only feel the pain when you use your arm, or you may feel it even when you are not using your arm.  A burning feeling that runs from your elbow through your arm.  Weak grip in your hands. How is this diagnosed? This condition may be diagnosed by medical history and physical exam. You may also have other tests, including:  X-rays.  MRI. How is this treated? Your health care provider will recommend lifestyle adjustments, such as resting and icing your arm. Treatment may also include:  Medicines for inflammation. This may include shots of cortisone if your pain continues.  Physical therapy. This may include massage or exercises.  An elbow brace. Surgery may eventually be recommended if  your pain does not go away with treatment. Follow these instructions at home: Activity  Rest your elbow and wrist as directed by your health care provider. Try to avoid any activities that caused the problem until your health care provider says that you can do them again.  If a physical therapist teaches you exercises, do all of them as directed.  If you lift an object, lift it with your palm facing upward. This lowers the stress on your elbow. Lifestyle  If your tennis elbow is caused by sports, check your equipment and make sure that:  You are using it correctly.  It is the best fit for you.  If your tennis elbow is caused by work, take breaks frequently, if you are able. Talk with your manager about how to best perform tasks in a way that is safe.  If your tennis elbow is caused by computer use, talk with your manager about any changes that can be made to your work environment. General instructions  If directed, apply ice to the painful area:  Put ice in a plastic bag.  Place a towel between your skin and the bag.  Leave the ice on for 20 minutes, 2-3 times per day.  Take medicines only as directed by your health care provider.  If you were given a brace, wear it as directed by your health care provider.  Keep all follow-up visits as directed by your health care provider. This is important. Contact a health care provider if:  Your pain does not get better with treatment.    Your pain gets worse.  You have numbness or weakness in your forearm, hand, or fingers. This information is not intended to replace advice given to you by your health care provider. Make sure you discuss any questions you have with your health care provider. Document Released: 10/23/2005 Document Revised: 06/22/2016 Document Reviewed: 10/19/2014 Elsevier Interactive Patient Education  2017 Elsevier Inc.  

## 2017-02-19 NOTE — Progress Notes (Signed)
Patient: Joe Dillon Male    DOB: July 10, 1956   61 y.o.   MRN: [id number] Visit Date: 02/19/2017  Today's Provider: Vernie Murders, PA   Chief Complaint  Patient presents with  . Elbow Pain   Subjective:    Arm Pain   Incident onset: recurrent episodes. This episode started around March 5th. There was no injury mechanism. The pain is present in the right elbow. The quality of the pain is described as aching. The pain does not radiate. The pain has been constant since the incident. Treatments tried: brace, and injection.   Patient Active Problem List   Diagnosis Date Noted  . Localized primary osteoarthrosis of carpometacarpal joint of right wrist 11/02/2016  . Mixed simple and mucopurulent chronic bronchitis ([place]) 10/28/2015  . Chronic cough 05/25/2015  . Backhand tennis elbow 05/25/2015  . Snores 05/25/2015  . ST segment depression 05/25/2015  . Synovitis and tenosynovitis 05/25/2015  . Tenosynovitis of hand 05/25/2015  . Irregular cardiac rhythm 12/16/2014  . Abnormal ECG 09/11/2014  . Benign essential HTN 09/11/2014  . Chest pain 09/11/2014  . Fatigue 09/11/2014  . Combined fat and carbohydrate induced hyperlipemia 09/11/2014  . Breathlessness on exertion 09/11/2014  . Current tobacco use 09/11/2014  . Thrombosed hemorrhoids 05/16/2010  . Blood in feces 02/24/2010   Past Surgical History:  Procedure Laterality Date  . INGUINAL HERNIA REPAIR Right 2000   Right Inguilal Herniorrhaphy   . INGUINAL HERNIA REPAIR Left 2001   Left Inguinal Herniorrhaphy   Family History  Problem Relation Age of Onset  . Alcohol abuse Maternal Grandfather    No Known Allergies   Previous Medications   ACETAMINOPHEN (TYLENOL) 325 MG TABLET    Take 650 mg by mouth every 6 (six) hours as needed. Reported on 10/28/2015   BUDESONIDE-FORMOTEROL (SYMBICORT) 160-4.5 MCG/ACT INHALER    Inhale 2 puffs into the lungs 2 (two) times daily.   LISINOPRIL (PRINIVIL,ZESTRIL) 20 MG TABLET    TAKE  ONE (1) TABLET BY MOUTH EVERY DAY    Review of Systems  Constitutional: Negative.   Respiratory: Negative.   Cardiovascular: Negative.   Musculoskeletal: Positive for arthralgias.    Social History  Substance Use Topics  . Smoking status: Current Every Day Smoker    Packs/day: 1.00    Years: 39.00    Types: Cigarettes  . Smokeless tobacco: Current User    Types: Chew  . Alcohol use 0.0 oz/week     Comment: occaionally   Objective:   BP 132/78 (BP Location: Right Arm, Patient Position: Sitting, Cuff Size: Normal)   Pulse 71   Temp 98.3 F (36.8 C) (Oral)   Wt 186 lb 6.4 oz (84.6 kg)   SpO2 97%   BMI 26.75 kg/m   Physical Exam  Constitutional: He is oriented to person, place, and time. He appears well-developed and well-nourished. No distress.  HENT:  Head: Normocephalic and atraumatic.  Right Ear: Hearing normal.  Left Ear: Hearing normal.  Nose: Nose normal.  Eyes: Conjunctivae and lids are normal. Right eye exhibits no discharge. Left eye exhibits no discharge. No scleral icterus.  Pulmonary/Chest: Effort normal. No respiratory distress.  Musculoskeletal: Normal range of motion. He exhibits tenderness.  Right elbow at the lateral epicondyle. Increased discomfort to test grip strength with the elbow fully extended.  Neurological: He is alert and oriented to person, place, and time.  Skin: Skin is intact. No lesion and no rash noted.  Psychiatric: He has a normal mood and affect.  His speech is normal and behavior is normal. Thought content normal.      Assessment & Plan:     1. Lateral epicondylitis of right elbow Onset of pain in the right elbow in the same area that was injected by Dr. Lorin Mercy (orthopedist) on 10-12-16. Using tennis elbow strap with some relief. May use ice pack or ice massage and add prednisone taper. If no better, may need physical therapy or recheck with Dr. Lorin Mercy. Limit "fat"golf shots or use of a hammer, weed eater, hedge trimmer, etc. -  predniSONE (DELTASONE) 10 MG tablet; Take 1 tablet (10 mg total) by mouth daily with breakfast. Taper dosage daily by one tablet (6,5,4,3,2,1)  Dispense: 21 tablet; Refill: 0

## 2017-06-25 ENCOUNTER — Telehealth: Payer: Self-pay | Admitting: Family Medicine

## 2017-06-25 NOTE — Telephone Encounter (Signed)
Pt wife Jackelyn Poling is asking if we have any samples of the Rx budesonide-formoterol (SYMBICORT) 160-4.5 MCG/ACT inhaler or can he get a Rx for something different due to the cost.  [facility].  CB#[phone]/MW

## 2017-06-25 NOTE — Telephone Encounter (Signed)
I am sure all inhalers are going to be the same cost. Since we are out of samples of Symbicort can we give him samples of Bevespi as these are the only COPD inhalers we currently have. Thanks!

## 2017-06-25 NOTE — Telephone Encounter (Signed)
Please advise. No samples available.

## 2017-06-25 NOTE — Telephone Encounter (Signed)
Samples at front desk for pick up. Patient's wife Jackelyn Poling advised.

## 2017-07-27 ENCOUNTER — Ambulatory Visit (INDEPENDENT_AMBULATORY_CARE_PROVIDER_SITE_OTHER): Payer: No Typology Code available for payment source

## 2017-07-27 ENCOUNTER — Ambulatory Visit (INDEPENDENT_AMBULATORY_CARE_PROVIDER_SITE_OTHER): Payer: No Typology Code available for payment source | Admitting: Podiatry

## 2017-07-27 ENCOUNTER — Encounter: Payer: Self-pay | Admitting: Podiatry

## 2017-07-27 VITALS — BP 148/91 | HR 68

## 2017-07-27 DIAGNOSIS — M722 Plantar fascial fibromatosis: Secondary | ICD-10-CM

## 2017-07-27 MED ORDER — DICLOFENAC SODIUM 75 MG PO TBEC: 75.0000 mg | DELAYED_RELEASE_TABLET | Freq: Two times a day (BID) | ORAL | 0 refills | Status: DC

## 2017-07-27 MED ORDER — BETAMETHASONE SOD PHOS & ACET 6 (3-3) MG/ML IJ SUSP: 3.0000 mg | Freq: Once | INTRAMUSCULAR | Status: DC

## 2017-07-27 NOTE — Progress Notes (Signed)
   Subjective:    Patient ID: Joe Dillon, male    DOB: 26-Jun-1956, 61 y.o.   MRN: [id number]  HPI   Chief Complaint  Patient presents with  . Foot Pain    Left - bottom of heel, painful x 3 weeks. Pt states "pain is worse in the morning, especially the first couple steps"       Review of Systems  Musculoskeletal: Positive for arthralgias and back pain.  All other systems reviewed and are negative.      Objective:   Physical Exam        Assessment & Plan:

## 2017-07-30 NOTE — Progress Notes (Signed)
   Subjective: Patient is a 61 year old male presenting today as a new patient with a new complaint of pain and tenderness in the left foot that began about three weeks ago. Patient states that it hurts in the mornings with the first steps out of bed. He has not done anything to treat his symptoms. Patient presents today for further treatment and evaluation.   Past Medical History:  Diagnosis Date  . Chronic cough   . Hemorrhoids, thrombosed 05/16/2010  . ST segment depression   . Tendinitis of elbow     Objective: Physical Exam General: The patient is alert and oriented x3 in no acute distress.  Dermatology: Skin is warm, dry and supple bilateral lower extremities. Negative for open lesions or macerations bilateral.   Vascular: Dorsalis Pedis and Posterior Tibial pulses palpable bilateral.  Capillary fill time is immediate to all digits.  Neurological: Epicritic and protective threshold intact bilateral.   Musculoskeletal: Tenderness to palpation at the medial calcaneal tubercale and through the insertion of the plantar fascia of the left foot. All other joints range of motion within normal limits bilateral. Strength 5/5 in all groups bilateral.   Radiographic exam:   Normal osseous mineralization. Joint spaces preserved. No fracture/dislocation/boney destruction. Calcaneal spur present with mild thickening of plantar fascia left. No other soft tissue abnormalities or radiopaque foreign bodies.   Assessment: 1. Plantar fasciitis left foot  Plan of Care:  1. Patient evaluated. Xrays reviewed.   2. Injection of 0.5cc Celestone soluspan injected into the left plantar fascia.  3. Rx for Diclofenac 75mg  PO BID ordered for patient. 4. Plantar fascial band(s) dispensed  5. Instructed patient regarding therapies and modalities at home to alleviate symptoms.  6. Return to clinic in 4 weeks.     Edrick Kins, DPM [facility]  Dr. Edrick Kins, DPM    [address], [place] [address]                Office [phone]  Fax [phone]

## 2017-08-24 ENCOUNTER — Ambulatory Visit (INDEPENDENT_AMBULATORY_CARE_PROVIDER_SITE_OTHER): Payer: No Typology Code available for payment source | Admitting: Podiatry

## 2017-08-24 ENCOUNTER — Encounter: Payer: Self-pay | Admitting: Podiatry

## 2017-08-24 DIAGNOSIS — M722 Plantar fascial fibromatosis: Secondary | ICD-10-CM

## 2017-08-27 NOTE — Progress Notes (Signed)
   Subjective: Patient is a 61 year old male presenting today for follow up evaluation of left plantar fasciitis. He states he is doing much better since receiving the injection, however he reports some continued intermittent pain. He rates it at 4/10. The brace also helps alleviate the pain. Patient presents today for further treatment and evaluation.   Past Medical History:  Diagnosis Date  . Chronic cough   . Hemorrhoids, thrombosed 05/16/2010  . ST segment depression   . Tendinitis of elbow     Objective: Physical Exam General: The patient is alert and oriented x3 in no acute distress.  Dermatology: Skin is warm, dry and supple bilateral lower extremities. Negative for open lesions or macerations bilateral.   Vascular: Dorsalis Pedis and Posterior Tibial pulses palpable bilateral.  Capillary fill time is immediate to all digits.  Neurological: Epicritic and protective threshold intact bilateral.   Musculoskeletal: Tenderness to palpation at the medial calcaneal tubercale and through the insertion of the plantar fascia of the left foot. All other joints range of motion within normal limits bilateral. Strength 5/5 in all groups bilateral.    Assessment: 1. Plantar fasciitis left foot  Plan of Care:  1. Patient evaluated. Xrays reviewed.   2. Injection of 0.5cc Celestone soluspan injected into the left plantar fascia.  3. Continue wearing plantar fascial brace. 4. Return to clinic in 4 weeks.  Does tile for work.    Edrick Kins, DPM [facility]  Dr. Edrick Kins, DPM    [address], [place] [address]                Office [phone]  Fax [phone]

## 2017-08-30 MED ORDER — BETAMETHASONE SOD PHOS & ACET 6 (3-3) MG/ML IJ SUSP: 3.0000 mg | Freq: Once | INTRAMUSCULAR | Status: DC

## 2017-09-03 ENCOUNTER — Telehealth: Payer: Self-pay | Admitting: Physician Assistant

## 2017-09-03 DIAGNOSIS — J418 Mixed simple and mucopurulent chronic bronchitis: Secondary | ICD-10-CM

## 2017-09-03 MED ORDER — GLYCOPYRROLATE-FORMOTEROL 9-4.8 MCG/ACT IN AERO: 1.0000 | INHALATION_SPRAY | Freq: Two times a day (BID) | RESPIRATORY_TRACT | 5 refills | Status: DC

## 2017-09-03 NOTE — Telephone Encounter (Signed)
Spoke with Jackelyn Poling and told her that we had 2 inhalers sample that will expired the end of this month. She reported it was ok to give.Will come and pick it up. She is requesting if one prescription can be send to the pharmacy to see how much it is going to be out of pocket.  Thanks,  -Joseline

## 2017-09-03 NOTE — Telephone Encounter (Signed)
Is it ok to give samples if we have some.?

## 2017-09-03 NOTE — Telephone Encounter (Signed)
Sent to [facility]

## 2017-09-03 NOTE — Telephone Encounter (Signed)
LMTCB  Thanks,  -Charity Tessier 

## 2017-09-03 NOTE — Telephone Encounter (Signed)
Pt wife returned call.  ER#[phone]

## 2017-09-03 NOTE — Telephone Encounter (Signed)
Bevespi inhaler ok to give samples if we have, if not let me know and I will send Rx.  Thanks. JB

## 2017-09-03 NOTE — Telephone Encounter (Signed)
Pt's wife Jackelyn Poling is requesting more samples of Bevesti or an Rx for the medication to be sent to [facility]. Please advise. Thanks TNP

## 2017-09-21 ENCOUNTER — Ambulatory Visit: Payer: No Typology Code available for payment source | Admitting: Podiatry

## 2017-09-24 ENCOUNTER — Encounter: Payer: Self-pay | Admitting: Family Medicine

## 2017-09-24 ENCOUNTER — Ambulatory Visit: Payer: Self-pay | Admitting: Family Medicine

## 2017-09-24 VITALS — BP 138/72 | HR 76 | Temp 98.4°F | Resp 16 | Wt 191.8 lb

## 2017-09-24 DIAGNOSIS — R05 Cough: Secondary | ICD-10-CM

## 2017-09-24 DIAGNOSIS — R059 Cough, unspecified: Secondary | ICD-10-CM

## 2017-09-24 DIAGNOSIS — J418 Mixed simple and mucopurulent chronic bronchitis: Secondary | ICD-10-CM

## 2017-09-24 DIAGNOSIS — R0602 Shortness of breath: Secondary | ICD-10-CM

## 2017-09-24 MED ORDER — FLUTICASONE FUROATE-VILANTEROL 200-25 MCG/INH IN AEPB: 1.0000 | INHALATION_SPRAY | Freq: Every day | RESPIRATORY_TRACT | 1 refills | Status: DC

## 2017-09-24 NOTE — Progress Notes (Signed)
Patient: Joe Dillon Male    DOB: 08-15-1956   61 y.o.   MRN: [id number] Visit Date: 09/24/2017  Today's Provider: Vernie Murders, PA   Chief Complaint  Patient presents with  . URI   Subjective:    URI   This is a new problem. Episode onset: couple weeks. The problem has been unchanged. There has been no fever. Associated symptoms include congestion and coughing. Associated symptoms comments: Body aches and SOB. Patient has not been taking Symbicort and feels the shortness of breathe has worsen. He has tried acetaminophen (NyQuil) for the symptoms. The treatment provided mild relief.   Past Medical History:  Diagnosis Date  . Chronic cough   . Hemorrhoids, thrombosed 05/16/2010  . ST segment depression   . Tendinitis of elbow    Past Surgical History:  Procedure Laterality Date  . INGUINAL HERNIA REPAIR Right 2000   Right Inguilal Herniorrhaphy   . INGUINAL HERNIA REPAIR Left 2001   Left Inguinal Herniorrhaphy   Family History  Problem Relation Age of Onset  . Alcohol abuse Maternal Grandfather    No Known Allergies  Current Outpatient Medications:  .  acetaminophen (TYLENOL) 325 MG tablet, Take 650 mg by mouth every 6 (six) hours as needed. Reported on 10/28/2015, Disp: , Rfl:  .  lisinopril (PRINIVIL,ZESTRIL) 20 MG tablet, TAKE ONE (1) TABLET BY MOUTH EVERY DAY, Disp: 30 tablet, Rfl: 5 .  budesonide-formoterol (SYMBICORT) 160-4.5 MCG/ACT inhaler, Inhale 2 puffs into the lungs 2 (two) times daily. (Patient not taking: Reported on 09/24/2017), Disp: 2 Inhaler, Rfl: 11 .  diclofenac (VOLTAREN) 75 MG EC tablet, Take 1 tablet (75 mg total) by mouth 2 (two) times daily. (Patient not taking: Reported on 09/24/2017), Disp: 60 tablet, Rfl: 0 .  Glycopyrrolate-Formoterol (BEVESPI AEROSPHERE) 9-4.8 MCG/ACT AERO, Inhale 1-2 puffs into the lungs 2 (two) times daily. (Patient not taking: Reported on 09/24/2017), Disp: 1 Inhaler, Rfl: 5  Review of Systems  Constitutional:  Negative.   HENT: Positive for congestion.   Respiratory: Positive for cough.   Cardiovascular: Negative.   Musculoskeletal: Positive for myalgias.   Social History   Tobacco Use  . Smoking status: Current Every Day Smoker    Packs/day: 1.00    Years: 39.00    Pack years: 39.00    Types: Cigarettes  . Smokeless tobacco: Current User    Types: Chew  Substance Use Topics  . Alcohol use: Yes    Alcohol/week: 0.0 oz    Comment: occaionally   Objective:   BP 138/72 (BP Location: Right Arm, Patient Position: Sitting, Cuff Size: Normal)   Pulse 76   Temp 98.4 F (36.9 C) (Oral)   Resp 16   Wt 191 lb 12.8 oz (87 kg)   SpO2 97%   BMI 27.52 kg/m  Wt Readings from Last 3 Encounters:  09/24/17 191 lb 12.8 oz (87 kg)  02/19/17 186 lb 6.4 oz (84.6 kg)  11/02/16 185 lb (83.9 kg)    Physical Exam  Constitutional: He is oriented to person, place, and time. He appears well-developed and well-nourished. No distress.  HENT:  Head: Normocephalic and atraumatic.  Right Ear: Hearing and external ear normal.  Left Ear: Hearing and external ear normal.  Nose: Nose normal.  Mouth/Throat: Oropharynx is clear and moist.  Eyes: Conjunctivae and lids are normal. Right eye exhibits no discharge. Left eye exhibits no discharge. No scleral icterus.  Neck: Neck supple.  Cardiovascular: Normal rate.  Pulmonary/Chest: Effort normal. He  has wheezes. He has no rales.  Abdominal: Soft.  Musculoskeletal: Normal range of motion.  Lymphadenopathy:    He has no cervical adenopathy.  Neurological: He is alert and oriented to person, place, and time.  Skin: Skin is intact. No lesion and no rash noted.  Psychiatric: He has a normal mood and affect. His speech is normal and behavior is normal. Thought content normal.      Assessment & Plan:     1. Mixed simple and mucopurulent chronic bronchitis ([facility]) Decreased energy and cough worsening over the past 2 weeks. No fever, rhinorrhea or sore throat now.  Increase fluid intake and may use Mucinex-DM. Still smoking 1 ppd for the past 39-40 years. Recommend he stop all smoking. Spirometry confirms airway obstruction with low vital capacity. - Spirometry with graph  2. Cough Increase in cough over the past 2 weeks. States wife has had some URI symptoms recently, also. Use medications as above.  - Spirometry with graph  3. SOB (shortness of breath) Has not gotten any improvement with the use of Bevespi Inhaler and feels he can't afford it when he checked into the cost. Was getting better response from the Symbicort but, again, did not feel he could afford it. Will give samples of Breo 200-25 MCG/INH one inhalation daily. Spirometry estimates lung age at >84 years with severe airway obstruction. Recheck in 1 month to assess progress. - Spirometry with graph       Vernie Murders, PA  [facility]

## 2017-10-22 ENCOUNTER — Encounter: Payer: Self-pay | Admitting: Family Medicine

## 2017-10-22 ENCOUNTER — Ambulatory Visit (INDEPENDENT_AMBULATORY_CARE_PROVIDER_SITE_OTHER): Payer: Self-pay | Admitting: Family Medicine

## 2017-10-22 ENCOUNTER — Telehealth: Payer: Self-pay | Admitting: Family Medicine

## 2017-10-22 VITALS — BP 142/94 | HR 72 | Temp 98.2°F | Resp 16 | Wt 189.0 lb

## 2017-10-22 DIAGNOSIS — J418 Mixed simple and mucopurulent chronic bronchitis: Secondary | ICD-10-CM

## 2017-10-22 DIAGNOSIS — R053 Chronic cough: Secondary | ICD-10-CM

## 2017-10-22 DIAGNOSIS — R05 Cough: Secondary | ICD-10-CM

## 2017-10-22 DIAGNOSIS — Z720 Tobacco use: Secondary | ICD-10-CM

## 2017-10-22 DIAGNOSIS — I10 Essential (primary) hypertension: Secondary | ICD-10-CM

## 2017-10-22 MED ORDER — FLUTICASONE FUROATE-VILANTEROL 200-25 MCG/INH IN AEPB: 1.0000 | INHALATION_SPRAY | Freq: Every day | RESPIRATORY_TRACT | 6 refills | Status: DC

## 2017-10-22 NOTE — Telephone Encounter (Signed)
Please Review.  Thanks,  -Luisdaniel Kenton 

## 2017-10-22 NOTE — Telephone Encounter (Signed)
pt was in today and seen Counce.  The rx for the inhaler that he called in is very expensive.  They are wanting to know if there is something less expensive  [facility]  Call back is [phone]  Thanks teri

## 2017-10-22 NOTE — Progress Notes (Signed)
Patient: Joe Dillon Male    DOB: [birth year]   61 y.o.   MRN: [id number] Visit Date: 10/22/2017  Today's Provider: Vernie Murders, PA   Chief Complaint  Patient presents with  . Bronchitis    Chronic one month follow up.   Subjective:    Breathing Problem  He complains of difficulty breathing and shortness of breath. There is no cough or wheezing. The problem has been gradually improving (Pt reports a 80% improvement since starting Breo. ). Associated symptoms include heartburn (Chronic issue; but states this is stable. ). Pertinent negatives include no appetite change, chest pain, dyspnea on exertion, ear congestion, ear pain, fever, headaches, malaise/fatigue, myalgias, nasal congestion, orthopnea, PND, postnasal drip, rhinorrhea, sneezing, sore throat, sweats, trouble swallowing or weight loss. He reports significant improvement on treatment. Risk factors for lung disease include smoking/tobacco exposure. His past medical history is significant for bronchitis.   Past Medical History:  Diagnosis Date  . Chronic cough   . Hemorrhoids, thrombosed 05/16/2010  . ST segment depression   . Tendinitis of elbow    Past Surgical History:  Procedure Laterality Date  . INGUINAL HERNIA REPAIR Right 2000   Right Inguilal Herniorrhaphy   . INGUINAL HERNIA REPAIR Left 2001   Left Inguinal Herniorrhaphy   Family History  Problem Relation Age of Onset  . Alcohol abuse Maternal Grandfather    No Known Allergies  Current Outpatient Medications:  .  acetaminophen (TYLENOL) 325 MG tablet, Take 650 mg by mouth every 6 (six) hours as needed. Reported on 10/28/2015, Disp: , Rfl:  .  fluticasone furoate-vilanterol (BREO ELLIPTA) 200-25 MCG/INH AEPB, Inhale 1 puff daily into the lungs., Disp: 28 each, Rfl: 1 .  lisinopril (PRINIVIL,ZESTRIL) 20 MG tablet, TAKE ONE (1) TABLET BY MOUTH EVERY DAY, Disp: 30 tablet, Rfl: 5  Current Facility-Administered Medications:  .  betamethasone  acetate-betamethasone sodium phosphate (CELESTONE) injection 3 mg, 3 mg, Intramuscular, Once, Evans, Brent M, DPM .  betamethasone acetate-betamethasone sodium phosphate (CELESTONE) injection 3 mg, 3 mg, Intramuscular, Once, Edrick Kins, DPM  Review of Systems  Constitutional: Negative.  Negative for appetite change, fever, malaise/fatigue and weight loss.  HENT: Negative.  Negative for ear pain, postnasal drip, rhinorrhea, sneezing, sore throat and trouble swallowing.   Respiratory: Positive for shortness of breath. Negative for apnea, cough, choking, chest tightness, wheezing and stridor.   Cardiovascular: Negative.  Negative for chest pain, dyspnea on exertion and PND.  Gastrointestinal: Positive for heartburn (Chronic issue; but states this is stable. ). Negative for abdominal distention, abdominal pain, anal bleeding, blood in stool, constipation, diarrhea, nausea, rectal pain and vomiting.  Musculoskeletal: Negative for myalgias.  Neurological: Negative for light-headedness and headaches.    Social History   Tobacco Use  . Smoking status: Current Every Day Smoker    Packs/day: 1.00    Years: 39.00    Pack years: 39.00    Types: Cigarettes  . Smokeless tobacco: Current User    Types: Chew  Substance Use Topics  . Alcohol use: Yes    Alcohol/week: 0.0 oz    Comment: occaionally   Objective:   BP (!) 142/94 (BP Location: Right Arm, Patient Position: Sitting, Cuff Size: Normal)   Pulse 72   Temp 98.2 F (36.8 C) (Oral)   Resp 16   Wt 189 lb (85.7 kg)   SpO2 97%   BMI 27.12 kg/m  Vitals:   10/22/17 1143  BP: (!) 142/94  Pulse: 72  Resp: 16  Temp: 98.2 F (36.8 C)  TempSrc: Oral  SpO2: 97%  Weight: 189 lb (85.7 kg)     Physical Exam  Constitutional: He is oriented to person, place, and time. He appears well-developed and well-nourished. No distress.  HENT:  Head: Normocephalic and atraumatic.  Right Ear: Hearing and external ear normal.  Left Ear: Hearing  and external ear normal.  Nose: Nose normal.  Mouth/Throat: Oropharynx is clear and moist.  Eyes: Conjunctivae and lids are normal. Right eye exhibits no discharge. Left eye exhibits no discharge. No scleral icterus.  Neck: Neck supple.  Cardiovascular: Normal rate.  Pulmonary/Chest: Effort normal and breath sounds normal. No respiratory distress.  Abdominal: Soft. Bowel sounds are normal.  Musculoskeletal: Normal range of motion.  Neurological: He is alert and oriented to person, place, and time.  Skin: Skin is intact. No lesion and no rash noted.  Psychiatric: He has a normal mood and affect. His speech is normal and behavior is normal. Thought content normal.      Assessment & Plan:       1. Chronic cough No longer having cough or need for cough suppressant. Continue Breo and stop smoking. - fluticasone furoate-vilanterol (BREO ELLIPTA) 200-25 MCG/INH AEPB; Inhale 1 puff into the lungs daily.  Dispense: 60 each; Refill: 6  2. Mixed simple and mucopurulent chronic bronchitis (HCC) Improved with use of the Breo daily. Spirometry shows improvement, also. Continue Breo daily and recheck in 4 months. - Spirometry with graph - fluticasone furoate-vilanterol (BREO ELLIPTA) 200-25 MCG/INH AEPB; Inhale 1 puff into the lungs daily.  Dispense: 60 each; Refill: 6  3. Benign essential HTN BP slightly elevated today. Forget to take medications often. Good tolerance with taking Lisinopril 20 mg qd. No edema, palpitations or chest pains. Recheck in 4 months.  4. Current tobacco use Still smoking 1 ppd. Counseled again about need to stop smoking to prevent progression of obstructive airway disease.   Vernie Murders, PA  [facility]

## 2017-10-23 NOTE — Telephone Encounter (Signed)
Come by the office to pick up pharmacy savings cards to see if they help. (GoodRx card and/or the [employer] card).

## 2017-10-23 NOTE — Telephone Encounter (Signed)
Patient advised. Savings cards at the front desk for pick up.

## 2017-10-26 ENCOUNTER — Ambulatory Visit (INDEPENDENT_AMBULATORY_CARE_PROVIDER_SITE_OTHER): Payer: No Typology Code available for payment source | Admitting: Podiatry

## 2017-10-26 ENCOUNTER — Encounter: Payer: Self-pay | Admitting: Podiatry

## 2017-10-26 ENCOUNTER — Ambulatory Visit: Payer: No Typology Code available for payment source | Admitting: Podiatry

## 2017-10-26 DIAGNOSIS — M722 Plantar fascial fibromatosis: Secondary | ICD-10-CM

## 2017-10-26 MED ORDER — DICLOFENAC SODIUM 75 MG PO TBEC: 75.0000 mg | DELAYED_RELEASE_TABLET | Freq: Two times a day (BID) | ORAL | 1 refills | Status: DC

## 2017-11-01 NOTE — Progress Notes (Signed)
   Subjective: Patient is a 61 year old male presenting today for follow up evaluation of left plantar fasciitis.  He states he was doing well but began having a flareup approximately 1 month ago.  Patient states the pain is worse in the morning when he first gets out of bed.  He has been taking Tylenol with no significant relief.  Patient presents today for further treatment and evaluation.   Past Medical History:  Diagnosis Date  . Chronic cough   . Hemorrhoids, thrombosed 05/16/2010  . ST segment depression   . Tendinitis of elbow     Objective: Physical Exam General: The patient is alert and oriented x3 in no acute distress.  Dermatology: Skin is warm, dry and supple bilateral lower extremities. Negative for open lesions or macerations bilateral.   Vascular: Dorsalis Pedis and Posterior Tibial pulses palpable bilateral.  Capillary fill time is immediate to all digits.  Neurological: Epicritic and protective threshold intact bilateral.   Musculoskeletal: Tenderness to palpation at the medial calcaneal tubercale and through the insertion of the plantar fascia of the left foot. All other joints range of motion within normal limits bilateral. Strength 5/5 in all groups bilateral.    Assessment: 1. Plantar fasciitis left foot  Plan of Care:  1. Patient evaluated.   2. Injection of 0.5cc Celestone soluspan injected into the left plantar fascia.  3. Prescription for diclofenac provided to patient. 4. Continue wearing plantar fascia brace. 5. Return to clinic as needed.  Does tile for work.    Edrick Kins, DPM [facility]  Dr. Edrick Kins, DPM    [address], [place] [address]                Office [phone]  Fax [phone]

## 2017-11-15 ENCOUNTER — Telehealth: Payer: Self-pay

## 2017-11-15 NOTE — Telephone Encounter (Signed)
Pt would like samples of Breo.  Please call if available.   Contact Mrs. Savo [phone]  Thanks,   -Mickel Baas

## 2017-11-15 NOTE — Telephone Encounter (Signed)
Patient's wife Jackelyn Poling advised Breo sample is at the front desk for pick up.

## 2017-11-15 NOTE — Telephone Encounter (Signed)
May have one if we have them.

## 2017-11-27 ENCOUNTER — Encounter: Payer: Self-pay | Admitting: Family Medicine

## 2017-11-27 ENCOUNTER — Ambulatory Visit (INDEPENDENT_AMBULATORY_CARE_PROVIDER_SITE_OTHER): Payer: Self-pay | Admitting: Family Medicine

## 2017-11-27 ENCOUNTER — Other Ambulatory Visit: Payer: Self-pay

## 2017-11-27 VITALS — BP 170/90 | HR 74 | Temp 98.1°F | Resp 18 | Wt 188.0 lb

## 2017-11-27 DIAGNOSIS — J209 Acute bronchitis, unspecified: Secondary | ICD-10-CM

## 2017-11-27 DIAGNOSIS — J440 Chronic obstructive pulmonary disease with acute lower respiratory infection: Secondary | ICD-10-CM

## 2017-11-27 MED ORDER — PREDNISONE 5 MG PO TABS: 5.0000 mg | ORAL_TABLET | Freq: Every day | ORAL | 0 refills | Status: DC

## 2017-11-27 MED ORDER — DOXYCYCLINE HYCLATE 100 MG PO TABS: 100.0000 mg | ORAL_TABLET | Freq: Two times a day (BID) | ORAL | 0 refills | Status: DC

## 2017-11-27 MED ORDER — IPRATROPIUM-ALBUTEROL 0.5-2.5 (3) MG/3ML IN SOLN: 3.0000 mL | Freq: Once | RESPIRATORY_TRACT | Status: DC

## 2017-11-27 NOTE — Progress Notes (Signed)
Joe Dillon  MRN: [id number] DOB: Mar 20, 1956  Subjective:  HPI   The patient is a 62 year old male who presents for evaluation of cough, congestion and shortness of breath.  He does have a history of COPD.  He has not been able to use the [facility] that was prescribed due to expense.   Hew complains today that for 8 days he has had a productive cough of green sputum.  He denies fever.  Has had shortness of breath and wheezing.   He is currently using Mucinex D for his symptoms.  He just started that today.   He went to work today and walked 3 flights of stairs and when he got to the work area the temperature was up causing him to get very hot and start coughing.  He states that it was too hot for him to work.  Patient Active Problem List   Diagnosis Date Noted  . Localized primary osteoarthrosis of carpometacarpal joint of right wrist 11/02/2016  . Mixed simple and mucopurulent chronic bronchitis ([place]) 10/28/2015  . Chronic cough 05/25/2015  . Backhand tennis elbow 05/25/2015  . Snores 05/25/2015  . ST segment depression 05/25/2015  . Synovitis and tenosynovitis 05/25/2015  . Tenosynovitis of hand 05/25/2015  . Irregular cardiac rhythm 12/16/2014  . Abnormal ECG 09/11/2014  . Benign essential HTN 09/11/2014  . Chest pain 09/11/2014  . Fatigue 09/11/2014  . Combined fat and carbohydrate induced hyperlipemia 09/11/2014  . Breathlessness on exertion 09/11/2014  . Current tobacco use 09/11/2014  . Thrombosed hemorrhoids 05/16/2010  . Blood in feces 02/24/2010   Past Medical History:  Diagnosis Date  . Chronic cough   . Hemorrhoids, thrombosed 05/16/2010  . ST segment depression   . Tendinitis of elbow    Past Surgical History:  Procedure Laterality Date  . INGUINAL HERNIA REPAIR Right 2000   Right Inguilal Herniorrhaphy   . INGUINAL HERNIA REPAIR Left 2001   Left Inguinal Herniorrhaphy   Family History  Problem Relation Age of Onset  . Alcohol abuse Maternal Grandfather     Social History   Socioeconomic History  . Marital status: Married    Spouse name: Not on file  . Number of children: Not on file  . Years of education: Not on file  . Highest education level: Not on file  Social Needs  . Financial resource strain: Not on file  . Food insecurity - worry: Not on file  . Food insecurity - inability: Not on file  . Transportation needs - medical: Not on file  . Transportation needs - non-medical: Not on file  Occupational History  . Not on file  Tobacco Use  . Smoking status: Current Every Day Smoker    Packs/day: 1.00    Years: 39.00    Pack years: 39.00    Types: Cigarettes  . Smokeless tobacco: Current User    Types: Chew  Substance and Sexual Activity  . Alcohol use: Yes    Alcohol/week: 0.0 oz    Comment: occaionally  . Drug use: No  . Sexual activity: Not on file  Other Topics Concern  . Not on file  Social History Narrative  . Not on file   Outpatient Encounter Medications as of 11/27/2017  Medication Sig  . acetaminophen (TYLENOL) 325 MG tablet Take 650 mg by mouth every 6 (six) hours as needed. Reported on 10/28/2015  . lisinopril (PRINIVIL,ZESTRIL) 20 MG tablet TAKE ONE (1) TABLET BY MOUTH EVERY DAY  . fluticasone  furoate-vilanterol (BREO ELLIPTA) 200-25 MCG/INH AEPB Inhale 1 puff into the lungs daily. (Patient not taking: Reported on 11/27/2017)  . [DISCONTINUED] diclofenac (VOLTAREN) 75 MG EC tablet Take 1 tablet (75 mg total) by mouth 2 (two) times daily.       .   .   No Known Allergies  Review of Systems  Constitutional: Negative for chills, diaphoresis, fever, malaise/fatigue and weight loss.  HENT: Positive for congestion. Negative for ear discharge, ear pain, hearing loss, nosebleeds, sinus pain, sore throat and tinnitus.   Eyes: Negative for blurred vision, double vision, photophobia, pain, discharge and redness.  Respiratory: Positive for cough, sputum production, shortness of breath and wheezing. Negative for  hemoptysis.   Cardiovascular: Negative for chest pain, palpitations, orthopnea, claudication and leg swelling.  Neurological: Negative for weakness.    Objective:  BP (!) 170/90 (BP Location: Right Arm, Patient Position: Sitting, Cuff Size: Normal)   Pulse 74   Temp 98.1 F (36.7 C) (Oral)   Resp 18   Wt 188 lb (85.3 kg)   SpO2 97%   BMI 26.98 kg/m   Physical Exam  Constitutional: He is well-developed, well-nourished, and in no distress.  HENT:  Head: Normocephalic.  Right Ear: External ear normal.  Left Ear: External ear normal.  Nose: Nose normal.  Mouth/Throat: Oropharynx is clear and moist.  Eyes: Conjunctivae are normal.  Neck: Neck supple.  Cardiovascular: Normal rate and regular rhythm.  Pulmonary/Chest: He has wheezes. He has no rales.  Slight dyspnea with coarse breath sound/rhonchi.  Abdominal: Soft. Bowel sounds are normal.    Assessment and Plan :  1. Acute bronchitis with COPD (HCC) Flare of bronchitis with purulent sputum over the past 8 days. Without insurance, he has not been able to afford the [facility] (although it worked well to control COPD with bronchitis). Duoneb by nebulizer helped to clear some of the wheezing and rhonchi. Will give sample of Advair 100/50 1 inhalation qd and prescription discount cards to check for coverage. Add Doxycycline and Prednisone taper. May use Mucinex-DM prn (not the "D" due to BP elevation). Increase fluid intake and recheck in 1 week if no better. - doxycycline (VIBRA-TABS) 100 MG tablet; Take 1 tablet (100 mg total) by mouth 2 (two) times daily.  Dispense: 20 tablet; Refill: 0 - ipratropium-albuterol (DUONEB) 0.5-2.5 (3) MG/3ML nebulizer solution 3 mL - predniSONE (DELTASONE) 5 MG tablet; Take 1 tablet (5 mg total) by mouth daily with breakfast. Taper down by one tablet daily (6,5,4,3,2,1)  Dispense: 21 tablet; Refill: 0

## 2017-12-12 ENCOUNTER — Other Ambulatory Visit: Payer: Self-pay

## 2017-12-12 DIAGNOSIS — I10 Essential (primary) hypertension: Secondary | ICD-10-CM

## 2017-12-13 MED ORDER — LISINOPRIL 20 MG PO TABS: ORAL_TABLET | ORAL | 5 refills | Status: DC

## 2018-02-19 ENCOUNTER — Ambulatory Visit: Payer: Self-pay | Admitting: Family Medicine

## 2018-04-11 ENCOUNTER — Other Ambulatory Visit: Payer: Self-pay | Admitting: Family Medicine

## 2018-04-11 ENCOUNTER — Telehealth: Payer: Self-pay | Admitting: Family Medicine

## 2018-04-11 DIAGNOSIS — J440 Chronic obstructive pulmonary disease with acute lower respiratory infection: Principal | ICD-10-CM

## 2018-04-11 DIAGNOSIS — J209 Acute bronchitis, unspecified: Secondary | ICD-10-CM

## 2018-04-11 MED ORDER — ALBUTEROL SULFATE HFA 108 (90 BASE) MCG/ACT IN AERS: 2.0000 | INHALATION_SPRAY | Freq: Four times a day (QID) | RESPIRATORY_TRACT | 2 refills | Status: DC | PRN

## 2018-04-11 NOTE — Telephone Encounter (Signed)
Wife called saying her husband went to [facility] yesterday for upper resp infection.  They gave him a breathing treatment and antibiotic and steroid.  Pt wants to know if he can get a refill on the inhaler/  He also wants to know if we can get a prescription for a nebulizer to have at home  Their call is [phone]  Thanks teri

## 2018-04-11 NOTE — Telephone Encounter (Signed)
Which inhaler - Breo or Advair? Should schedule follow up to see if we need the nebulizer yet.

## 2018-04-11 NOTE — Telephone Encounter (Signed)
Will refill the Albuterol at the [facility]. The increase in shortness of breath is all the more reason for a follow up appointment.

## 2018-04-11 NOTE — Telephone Encounter (Signed)
Patient's wife Jackelyn Poling states patient was seen at [facility] on 04/10/18 they prescribed Albuterol inhaler and give him a duo neb in office. [name] was requesting a refill for Albuterol be sent to [facility] and RX for a nebulizer and solution be sent also. Advised [name] per Simona Huh patient will need to follow up with him to see if nebulizer would be needed. Patient is self-pay they will call back when Albuterol inhaler is half empty to schedule a follow up. She states patient's symptoms are worsening to the point he may have to file for disability. She states he can not do hardly anything because he is so SOB.

## 2018-04-12 ENCOUNTER — Ambulatory Visit: Payer: Self-pay | Admitting: Family Medicine

## 2018-04-12 ENCOUNTER — Encounter: Payer: Self-pay | Admitting: Family Medicine

## 2018-04-12 ENCOUNTER — Ambulatory Visit
Admission: RE | Admit: 2018-04-12 | Discharge: 2018-04-12 | Disposition: A | Discharge status: Home / Self Care | Payer: Self-pay | Source: Ambulatory Visit | Attending: Family Medicine | Admitting: Family Medicine

## 2018-04-12 ENCOUNTER — Telehealth: Payer: Self-pay | Admitting: Family Medicine

## 2018-04-12 VITALS — BP 158/90 | HR 82 | Temp 98.2°F | Wt 186.0 lb

## 2018-04-12 DIAGNOSIS — R062 Wheezing: Secondary | ICD-10-CM

## 2018-04-12 DIAGNOSIS — J441 Chronic obstructive pulmonary disease with (acute) exacerbation: Secondary | ICD-10-CM

## 2018-04-12 MED ORDER — IPRATROPIUM-ALBUTEROL 0.5-2.5 (3) MG/3ML IN SOLN
3.0000 mL | Freq: Once | RESPIRATORY_TRACT | Status: DC
  Administered 2018-04-12: 3 mL via RESPIRATORY_TRACT

## 2018-04-12 NOTE — Telephone Encounter (Signed)
No sign of pneumonia. COPD with acute bronchitis on CXR. Awaiting lab results. Will continue Z-pak, prednisone, Symbicort and Albuterol prn. Advised to recheck lungs in a week or sooner pending lab reports. Also, advised he go to ER if dyspnea/wheezing worsens or fever above 101. Wife agrees with plan.

## 2018-04-12 NOTE — Progress Notes (Signed)
Patient: Joe Dillon Male    DOB: March 03, 1956   62 y.o.   MRN: [id number] Visit Date: 04/12/2018  Today's Provider: Vernie Murders, PA   Chief Complaint  Patient presents with  . URI   Subjective:    URI   This is a new problem. The problem has been gradually worsening. Maximum temperature: 99.6 while at the Urgent Care. Associated symptoms include congestion, coughing and wheezing. Associated symptoms comments: SOB and wheezing . Treatments tried: Patient was seen at [facility] on 04/10/18. They prescribed patient a Albuterol inhaler, Prednisone 20 mg, and Z-pak. He had a duo-neb in office.   Past Medical History:  Diagnosis Date  . Chronic cough   . Hemorrhoids, thrombosed 05/16/2010  . ST segment depression   . Tendinitis of elbow    Patient Active Problem List   Diagnosis Date Noted  . Localized primary osteoarthrosis of carpometacarpal joint of right wrist 11/02/2016  . Mixed simple and mucopurulent chronic bronchitis ([place]) 10/28/2015  . Chronic cough 05/25/2015  . Backhand tennis elbow 05/25/2015  . Snores 05/25/2015  . ST segment depression 05/25/2015  . Synovitis and tenosynovitis 05/25/2015  . Tenosynovitis of hand 05/25/2015  . Irregular cardiac rhythm 12/16/2014  . Abnormal ECG 09/11/2014  . Benign essential HTN 09/11/2014  . Chest pain 09/11/2014  . Fatigue 09/11/2014  . Combined fat and carbohydrate induced hyperlipemia 09/11/2014  . Breathlessness on exertion 09/11/2014  . Current tobacco use 09/11/2014  . Thrombosed hemorrhoids 05/16/2010  . Blood in feces 02/24/2010   Past Surgical History:  Procedure Laterality Date  . INGUINAL HERNIA REPAIR Right 2000   Right Inguilal Herniorrhaphy   . INGUINAL HERNIA REPAIR Left 2001   Left Inguinal Herniorrhaphy   Family History  Problem Relation Age of Onset  . Alcohol abuse Maternal Grandfather    No Known Allergies  Current Outpatient Medications:  .  acetaminophen (TYLENOL) 325 MG tablet,  Take 650 mg by mouth every 6 (six) hours as needed. Reported on 10/28/2015, Disp: , Rfl:  .  albuterol (PROVENTIL HFA;VENTOLIN HFA) 108 (90 Base) MCG/ACT inhaler, Inhale 2 puffs into the lungs every 6 (six) hours as needed for wheezing or shortness of breath., Disp: 1 Inhaler, Rfl: 2 .  lisinopril (PRINIVIL,ZESTRIL) 20 MG tablet, TAKE ONE (1) TABLET BY MOUTH EVERY DAY, Disp: 30 tablet, Rfl: 5  Current Facility-Administered Medications:  .  betamethasone acetate-betamethasone sodium phosphate (CELESTONE) injection 3 mg, 3 mg, Intramuscular, Once, Evans, Brent M, DPM .  betamethasone acetate-betamethasone sodium phosphate (CELESTONE) injection 3 mg, 3 mg, Intramuscular, Once, Evans, Brent M, DPM .  ipratropium-albuterol (DUONEB) 0.5-2.5 (3) MG/3ML nebulizer solution 3 mL, 3 mL, Nebulization, Once, Alivea Gladson, Vickki Muff, PA  Review of Systems  Constitutional: Negative.   HENT: Positive for congestion.   Respiratory: Positive for cough, shortness of breath and wheezing.   Cardiovascular: Negative.    Social History   Tobacco Use  . Smoking status: Current Every Day Smoker    Packs/day: 1.00    Years: 39.00    Pack years: 39.00    Types: Cigarettes  . Smokeless tobacco: Current User    Types: Chew  Substance Use Topics  . Alcohol use: Yes    Alcohol/week: 0.0 oz    Comment: occaionally   Objective:   BP (!) 158/90 (BP Location: Right Arm, Patient Position: Sitting, Cuff Size: Normal)   Pulse 82   Temp 98.2 F (36.8 C) (Oral)   Wt 186 lb (84.4  kg)   SpO2 93%   BMI 26.69 kg/m    Physical Exam  Constitutional: He is oriented to person, place, and time. He appears well-developed and well-nourished. No distress.  HENT:  Head: Normocephalic and atraumatic.  Right Ear: Hearing normal.  Left Ear: Hearing normal.  Nose: Nose normal.  Eyes: Conjunctivae and lids are normal. Right eye exhibits no discharge. Left eye exhibits no discharge. No scleral icterus.  Cardiovascular: Normal rate  and regular rhythm.  Pulmonary/Chest: He is in respiratory distress. He has wheezes.  Tight wheezes with prolonged expiratory phase.  Musculoskeletal: Normal range of motion.  Neurological: He is alert and oriented to person, place, and time.  Skin: Skin is intact. No lesion and no rash noted.  Psychiatric: He has a normal mood and affect. His speech is normal and behavior is normal. Thought content normal.      Assessment & Plan:     1. Wheezes Onset 3-4 days ago with cough and shortness of breath. Has not been able to smoke the past 4 days due to wheezing. Treated at a walk-in clinic on 04-10-18 and was better after a DuoNeb nebulizer treatment. Dyspnea and wheeze has returned but no further fever. With another DuoNeb treatment, feeling a little better but still wheezing. Will restart Symbicort, continue Z-pak, Prednisone and Albuterol inhaler. Check CBC, CMP and CXR to rule out pneumonia. Wife and patient agreed to go to ER if worsening or fever develops over the weekend. - CBC with Differential/Platelet - Comprehensive metabolic panel - DG Chest 2 View - ipratropium-albuterol (DUONEB) 0.5-2.5 (3) MG/3ML nebulizer solution 3 mL  2. Chronic obstructive pulmonary disease with acute exacerbation (HCC) Dyspnea and cough with tight wheezes today. Started  3-4 days ago and was better after treatment at the [facility] but return of symptoms in the past day. Will get CBC and CXR to rule out pneumonia. Continue Z-pak, Prednisone taper and Albuterol QID prn. Add Symbicort 160-4.5 mcg /act 2 puffs BID daily. Advised to go to ER if dyspnea worsens or fever develops. - CBC with Differential/Platelet - Comprehensive metabolic panel - DG Chest 2 View - ipratropium-albuterol (DUONEB) 0.5-2.5 (3) MG/3ML nebulizer solution 3 mL       Vernie Murders, PA  [facility]

## 2018-04-12 NOTE — Telephone Encounter (Signed)
Pt's wife called back asking for results from his chest Xray from this am.    [phone]   thanks teri

## 2018-04-12 NOTE — Telephone Encounter (Signed)
Patient scheduled a appointment for 9:20 today.

## 2018-04-13 LAB — CBC WITH DIFFERENTIAL/PLATELET
BASOS: 0 %
Basophils Absolute: 0 10*3/uL (ref 0.0–0.2)
EOS (ABSOLUTE): 0.1 10*3/uL (ref 0.0–0.4)
EOS: 1 %
Hematocrit: 41.5 % (ref 37.5–51.0)
Hemoglobin: 13.9 g/dL (ref 13.0–17.7)
IMMATURE GRANULOCYTES: 0 %
Immature Grans (Abs): 0 10*3/uL (ref 0.0–0.1)
LYMPHS ABS: 1.4 10*3/uL (ref 0.7–3.1)
Lymphs: 16 %
MCH: 30.5 pg (ref 26.6–33.0)
MCHC: 33.5 g/dL (ref 31.5–35.7)
MCV: 91 fL (ref 79–97)
MONOS ABS: 0.7 10*3/uL (ref 0.1–0.9)
Monocytes: 8 %
NEUTROS ABS: 6.8 10*3/uL (ref 1.4–7.0)
NEUTROS PCT: 75 %
Platelets: 158 10*3/uL (ref 150–450)
RBC: 4.56 x10E6/uL (ref 4.14–5.80)
RDW: 12.7 % (ref 12.3–15.4)
WBC: 9.1 10*3/uL (ref 3.4–10.8)

## 2018-04-13 LAB — COMPREHENSIVE METABOLIC PANEL
A/G RATIO: 1.4 (ref 1.2–2.2)
ALT: 17 IU/L (ref 0–44)
AST: 18 IU/L (ref 0–40)
Albumin: 4.2 g/dL (ref 3.6–4.8)
Alkaline Phosphatase: 76 IU/L (ref 39–117)
BUN/Creatinine Ratio: 16 (ref 10–24)
BUN: 11 mg/dL (ref 8–27)
Bilirubin Total: 0.3 mg/dL (ref 0.0–1.2)
CALCIUM: 8.8 mg/dL (ref 8.6–10.2)
CO2: 25 mmol/L (ref 20–29)
Chloride: 100 mmol/L (ref 96–106)
Creatinine, Ser: 0.67 mg/dL — ABNORMAL LOW (ref 0.76–1.27)
GFR, EST AFRICAN AMERICAN: 118 mL/min/{1.73_m2} (ref >59)
GFR, EST NON AFRICAN AMERICAN: 102 mL/min/{1.73_m2} (ref >59)
GLOBULIN, TOTAL: 2.9 g/dL (ref 1.5–4.5)
Glucose: 121 mg/dL — ABNORMAL HIGH (ref 65–99)
POTASSIUM: 3.8 mmol/L (ref 3.5–5.2)
SODIUM: 139 mmol/L (ref 134–144)
Total Protein: 7.1 g/dL (ref 6.0–8.5)

## 2018-04-15 NOTE — Telephone Encounter (Signed)
-----   Message from Margo Common, [place] sent at 04/15/2018  8:14 AM EDT ----- Blood tests and chest x-ray negative for pneumonia. Continue medications and inhalers for COPD with acute bronchitis. Recheck progress in 3-4 days (sooner if needed).

## 2018-04-15 NOTE — Telephone Encounter (Signed)
Joe Dillon spoke with patient on 04/12/18 regarding xray results. Left message today for patient;s wife Joe Dillon to advise her that labs were normal and call back to schedule a 3-4 day follow up to recheck progress.

## 2018-04-16 ENCOUNTER — Ambulatory Visit (INDEPENDENT_AMBULATORY_CARE_PROVIDER_SITE_OTHER): Payer: Self-pay | Admitting: Family Medicine

## 2018-04-16 ENCOUNTER — Encounter: Payer: Self-pay | Admitting: Family Medicine

## 2018-04-16 VITALS — BP 148/84 | HR 83 | Temp 98.8°F | Wt 184.8 lb

## 2018-04-16 DIAGNOSIS — R0681 Apnea, not elsewhere classified: Secondary | ICD-10-CM

## 2018-04-16 DIAGNOSIS — J441 Chronic obstructive pulmonary disease with (acute) exacerbation: Secondary | ICD-10-CM

## 2018-04-16 MED ORDER — IPRATROPIUM-ALBUTEROL 0.5-2.5 (3) MG/3ML IN SOLN
3.0000 mL | Freq: Once | RESPIRATORY_TRACT | Status: AC
  Administered 2018-04-16: 3 mL via RESPIRATORY_TRACT

## 2018-04-16 MED ORDER — LEVOFLOXACIN 500 MG PO TABS: 500.0000 mg | ORAL_TABLET | Freq: Every day | ORAL | 0 refills | Status: DC

## 2018-04-16 MED ORDER — METHYLPREDNISOLONE ACETATE 80 MG/ML IJ SUSP
80.0000 mg | Freq: Once | INTRAMUSCULAR | Status: AC
  Administered 2018-04-16: 80 mg via INTRAMUSCULAR

## 2018-04-16 MED ORDER — BUDESONIDE-FORMOTEROL FUMARATE 160-4.5 MCG/ACT IN AERO: 2.0000 | INHALATION_SPRAY | Freq: Two times a day (BID) | RESPIRATORY_TRACT | 3 refills | Status: DC

## 2018-04-16 MED ORDER — ACLIDINIUM BROMIDE 400 MCG/ACT IN AEPB: 400.0000 ug | INHALATION_SPRAY | Freq: Two times a day (BID) | RESPIRATORY_TRACT | 3 refills | Status: DC

## 2018-04-16 NOTE — Progress Notes (Signed)
Patient: Joe Dillon Male    DOB: 08-29-1956   62 y.o.   MRN: [id number] Visit Date: 04/16/2018  Today's Provider: Vernie Murders, PA   Chief Complaint  Patient presents with  . Acute Bronchitis with COPD Follow Up   Subjective:    HPI Acute Bronchitis with COPD:  Patient presents for a 4 day follow up. Last OV was on 04/12/18 patient was given a du-neb in office. Labs and CXR were done. Results showed COPD with acute bronchitis, no pneumonia. Patient advised to continue Symbicort, continue Z-pak, Prednisone and Albuterol inhaler. He reports good compliance with treatment plan. He states symptoms are unchanged. Symptoms currently include productive cough with excessive sputum, SOB, wheezing, and sweats/fever started couple days ago. Patient was advised at last OV to go to the ER if worsening or fever develops.    Past Medical History:  Diagnosis Date  . Chronic cough   . Hemorrhoids, thrombosed 05/16/2010  . ST segment depression   . Tendinitis of elbow    Past Surgical History:  Procedure Laterality Date  . INGUINAL HERNIA REPAIR Right 2000   Right Inguilal Herniorrhaphy   . INGUINAL HERNIA REPAIR Left 2001   Left Inguinal Herniorrhaphy   Family History  Problem Relation Age of Onset  . Alcohol abuse Maternal Grandfather    No Known Allergies  Current Outpatient Medications:  .  acetaminophen (TYLENOL) 325 MG tablet, Take 650 mg by mouth every 6 (six) hours as needed. Reported on 10/28/2015, Disp: , Rfl:  .  albuterol (PROVENTIL HFA;VENTOLIN HFA) 108 (90 Base) MCG/ACT inhaler, Inhale 2 puffs into the lungs every 6 (six) hours as needed for wheezing or shortness of breath., Disp: 1 Inhaler, Rfl: 2 .  lisinopril (PRINIVIL,ZESTRIL) 20 MG tablet, TAKE ONE (1) TABLET BY MOUTH EVERY DAY, Disp: 30 tablet, Rfl: 5  Current Facility-Administered Medications:  .  betamethasone acetate-betamethasone sodium phosphate (CELESTONE) injection 3 mg, 3 mg, Intramuscular, Once, Evans,  Brent M, DPM .  betamethasone acetate-betamethasone sodium phosphate (CELESTONE) injection 3 mg, 3 mg, Intramuscular, Once, Evans, Brent M, DPM .  ipratropium-albuterol (DUONEB) 0.5-2.5 (3) MG/3ML nebulizer solution 3 mL, 3 mL, Nebulization, Once, Rajiv Parlato, Vickki Muff, PA   Review of Systems  Constitutional:       Sweats   HENT: Positive for congestion.   Respiratory: Positive for cough, shortness of breath and wheezing.   Cardiovascular: Negative.    Social History   Tobacco Use  . Smoking status: Current Every Day Smoker    Packs/day: 1.00    Years: 39.00    Pack years: 39.00    Types: Cigarettes  . Smokeless tobacco: Current User    Types: Chew  Substance Use Topics  . Alcohol use: Yes    Alcohol/week: 0.0 oz    Comment: occaionally   Objective:   BP (!) 148/84 (BP Location: Right Arm, Patient Position: Sitting, Cuff Size: Normal)   Pulse 83   Temp 98.8 F (37.1 C) (Oral)   Wt 184 lb 12.8 oz (83.8 kg)   SpO2 93%   BMI 26.52 kg/m  Vitals:   04/16/18 1103  BP: (!) 148/84  Pulse: 83  Temp: 98.8 F (37.1 C)  TempSrc: Oral  SpO2: 93%  Weight: 184 lb 12.8 oz (83.8 kg)   Physical Exam  Constitutional: He is oriented to person, place, and time. He appears well-developed and well-nourished. No distress.  HENT:  Head: Normocephalic and atraumatic.  Right Ear: Hearing normal.  Left Ear: Hearing normal.  Nose: Nose normal.  Eyes: Conjunctivae and lids are normal. Right eye exhibits no discharge. Left eye exhibits no discharge. No scleral icterus.  Cardiovascular: Normal rate.  Pulmonary/Chest: He is in respiratory distress. He has wheezes.  Intermittent wheezes with purulent sputum worse in the morning. Increased expiratory phase.   Musculoskeletal: Normal range of motion.  Neurological: He is alert and oriented to person, place, and time.  Skin: Skin is intact. No lesion and no rash noted.  Psychiatric: He has a normal mood and affect. His speech is normal and  behavior is normal. Thought content normal.      Assessment & Plan:     1. Breathlessness on exertion Still having cough and sputum production. Slightly better breathing since last OV. No wheezing after the nebulizer treatment. Will add Tudorza BID to the Symbicort BID and Albuterol prn. If no better in 3-4 days, will need referral to pulmonologist. - ipratropium-albuterol (DUONEB) 0.5-2.5 (3) MG/3ML nebulizer solution 3 mL - methylPREDNISolone acetate (DEPO-MEDROL) injection 80 mg  2. COPD with acute exacerbation ([place]) Slightly clearer than a week ago. Still some sputum production and dyspnea. Using Symbicort 160-4.5 mcg/act 2 puffs BID and Albuterol 2 puffs QID. Given [employer] for these inhalers and adding Tudorza 1 inhalation BID. Given Depo-Medrol injection and Levaquin 500 mg qd. If no further improvement in 3-4 days, will need pulmonology referral. After nebulizer treatment pulse oximetry 93% while walking.  - methylPREDNISolone acetate (DEPO-MEDROL) injection 80 mg - levofloxacin (LEVAQUIN) 500 MG tablet; Take 1 tablet (500 mg total) by mouth daily.  Dispense: 7 tablet; Refill: 0 - budesonide-formoterol (SYMBICORT) 160-4.5 MCG/ACT inhaler; Inhale 2 puffs into the lungs 2 (two) times daily.  Dispense: 1 Inhaler; Refill: 3 - Aclidinium Bromide 400 MCG/ACT AEPB; Inhale 400 mcg into the lungs 2 (two) times daily.  Dispense: 30 each; Refill: [place]

## 2018-04-18 ENCOUNTER — Ambulatory Visit: Payer: Self-pay | Admitting: Family Medicine

## 2018-10-11 ENCOUNTER — Other Ambulatory Visit: Payer: Self-pay | Admitting: Family Medicine

## 2018-10-11 DIAGNOSIS — J441 Chronic obstructive pulmonary disease with (acute) exacerbation: Secondary | ICD-10-CM

## 2018-10-11 MED ORDER — BUDESONIDE-FORMOTEROL FUMARATE 160-4.5 MCG/ACT IN AERO: 2.0000 | INHALATION_SPRAY | Freq: Two times a day (BID) | RESPIRATORY_TRACT | 3 refills | Status: DC

## 2018-10-14 ENCOUNTER — Telehealth: Payer: Self-pay

## 2018-10-14 DIAGNOSIS — J441 Chronic obstructive pulmonary disease with (acute) exacerbation: Secondary | ICD-10-CM

## 2018-10-14 MED ORDER — BUDESONIDE-FORMOTEROL FUMARATE 160-4.5 MCG/ACT IN AERO: 2.0000 | INHALATION_SPRAY | Freq: Two times a day (BID) | RESPIRATORY_TRACT | 3 refills | Status: DC

## 2018-10-14 NOTE — Telephone Encounter (Signed)
Patient is calling office stating that Joe Dillon has not received his prescription refill for Symbicort and is requesting that we contact pharmacy. KW

## 2018-10-15 MED ORDER — BUDESONIDE-FORMOTEROL FUMARATE 160-4.5 MCG/ACT IN AERO: 2.0000 | INHALATION_SPRAY | Freq: Two times a day (BID) | RESPIRATORY_TRACT | 3 refills | Status: DC

## 2018-10-15 NOTE — Telephone Encounter (Signed)
Pharmacy did not receive rx for Symbicort. It looks like rx was sent under sample instead of normal. Rx was resent.

## 2018-10-15 NOTE — Addendum Note (Signed)
Addended by: Julieta Bellini on: 10/15/2018 01:44 PM   Modules accepted: Orders

## 2018-10-15 NOTE — Telephone Encounter (Signed)
Patients wife had called the office to report that patient had went to [facility] and pharmacist states that symbicort has not been sent in. Looking through chart it looks like this was phoned in by Joseline yesterday. Can you please contact pharmacy and clarify if prescription was received. KW

## 2018-11-22 ENCOUNTER — Other Ambulatory Visit: Payer: Self-pay

## 2018-11-22 DIAGNOSIS — I10 Essential (primary) hypertension: Secondary | ICD-10-CM

## 2018-11-22 MED ORDER — LISINOPRIL 20 MG PO TABS: ORAL_TABLET | ORAL | 5 refills | Status: DC

## 2018-11-22 NOTE — Telephone Encounter (Signed)
Patient wife Joe Dillon)  called requesting a refill. [name] request that we call her to let her know once this has been refilled.

## 2018-11-22 NOTE — Telephone Encounter (Signed)
Patient wanted to come in next Friday.Patient scheduled.

## 2018-11-22 NOTE — Telephone Encounter (Signed)
Patient's wife Jackelyn Poling advised as directed below.

## 2018-11-22 NOTE — Telephone Encounter (Signed)
Sent to [facility]. Remind patient he is due for follow up labs and BP recheck in the next 1-2 months.

## 2018-11-29 ENCOUNTER — Ambulatory Visit (INDEPENDENT_AMBULATORY_CARE_PROVIDER_SITE_OTHER): Payer: No Typology Code available for payment source | Admitting: Family Medicine

## 2018-11-29 ENCOUNTER — Encounter: Payer: Self-pay | Admitting: Family Medicine

## 2018-11-29 VITALS — BP 148/80 | HR 83 | Temp 98.0°F | Resp 16 | Wt 184.8 lb

## 2018-11-29 DIAGNOSIS — E782 Mixed hyperlipidemia: Secondary | ICD-10-CM

## 2018-11-29 DIAGNOSIS — Z720 Tobacco use: Secondary | ICD-10-CM | POA: Diagnosis not present

## 2018-11-29 DIAGNOSIS — I10 Essential (primary) hypertension: Secondary | ICD-10-CM

## 2018-11-29 NOTE — Progress Notes (Signed)
Patient: Joe Dillon Male    DOB: [birth year]   63 y.o.   MRN: [id number] Visit Date: 11/29/2018  Today's Provider: Vernie Murders, PA   Chief Complaint  Patient presents with  . Follow-up    HTN   Subjective:     HPI Patient here today for follow-up BP and Labs. Patient currently on Lisinopril.  Hypertension, follow-up:  BP Readings from Last 3 Encounters:  11/29/18 (!) 148/80  04/16/18 (!) 148/84  04/12/18 (!) 158/90   He reports excellent compliance with treatment. He is not having side effects.  He is not exercising. He between adherent to low salt diet.   Outside blood pressures are n/a He is experiencing none.  Patient denies chest pain, chest pressure/discomfort, claudication, exertional chest pressure/discomfort, fatigue, irregular heart beat, lower extremity edema, near-syncope and palpitations.   Cardiovascular risk factors include advanced age (older than 19 for men, 28 for women), hypertension, male gender, sedentary lifestyle and smoking/ tobacco exposure.    Weight trend: stable Wt Readings from Last 3 Encounters:  11/29/18 184 lb 12.8 oz (83.8 kg)  04/16/18 184 lb 12.8 oz (83.8 kg)  04/12/18 186 lb (84.4 kg)    Current diet: in general, an "unhealthy" diet  ------------------------------------------------------------------------  Past Medical History:  Diagnosis Date  . Chronic cough   . Hemorrhoids, thrombosed 05/16/2010  . ST segment depression   . Tendinitis of elbow    Past Surgical History:  Procedure Laterality Date  . INGUINAL HERNIA REPAIR Right 2000   Right Inguilal Herniorrhaphy   . INGUINAL HERNIA REPAIR Left 2001   Left Inguinal Herniorrhaphy   Family History  Problem Relation Age of Onset  . Alcohol abuse Maternal Grandfather    No Known Allergies  Current Outpatient Medications:  .  acetaminophen (TYLENOL) 325 MG tablet, Take 650 mg by mouth every 6 (six) hours as needed. Reported on 10/28/2015, Disp: , Rfl:  .   albuterol (PROVENTIL HFA;VENTOLIN HFA) 108 (90 Base) MCG/ACT inhaler, Inhale 2 puffs into the lungs every 6 (six) hours as needed for wheezing or shortness of breath., Disp: 1 Inhaler, Rfl: 2 .  budesonide-formoterol (SYMBICORT) 160-4.5 MCG/ACT inhaler, Inhale 2 puffs into the lungs 2 (two) times daily., Disp: 1 Inhaler, Rfl: 3 .  lisinopril (PRINIVIL,ZESTRIL) 20 MG tablet, TAKE ONE (1) TABLET BY MOUTH EVERY DAY, Disp: 30 tablet, Rfl: 5 .  meloxicam (MOBIC) 15 MG tablet, Mobic 15 mg tablet  Take 1 tablet every day by oral route., Disp: , Rfl:  .  Aclidinium Bromide 400 MCG/ACT AEPB, Inhale 400 mcg into the lungs 2 (two) times daily. (Patient not taking: Reported on 11/29/2018), Disp: 30 each, Rfl: 3 .  levofloxacin (LEVAQUIN) 500 MG tablet, Take 1 tablet (500 mg total) by mouth daily. (Patient not taking: Reported on 11/29/2018), Disp: 7 tablet, Rfl: 0  Current Facility-Administered Medications:  .  betamethasone acetate-betamethasone sodium phosphate (CELESTONE) injection 3 mg, 3 mg, Intramuscular, Once, Evans, Brent M, DPM .  betamethasone acetate-betamethasone sodium phosphate (CELESTONE) injection 3 mg, 3 mg, Intramuscular, Once, Edrick Kins, DPM  Review of Systems  Constitutional: Negative.   HENT: Negative.   Respiratory: Negative.   Cardiovascular: Negative.   Gastrointestinal: Negative.   Genitourinary: Negative.     Social History   Tobacco Use  . Smoking status: Current Every Day Smoker    Packs/day: 1.00    Years: 39.00    Pack years: 39.00    Types: Cigarettes  . Smokeless tobacco:  Current User    Types: Chew  Substance Use Topics  . Alcohol use: Yes    Alcohol/week: 0.0 standard drinks    Comment: occaionally     Objective:   BP (!) 148/80 (BP Location: Right Arm, Patient Position: Sitting, Cuff Size: Large)   Pulse 83   Temp 98 F (36.7 C) (Oral)   Resp 16   Wt 184 lb 12.8 oz (83.8 kg)   BMI 26.52 kg/m    Wt Readings from Last 3 Encounters:  11/29/18 184  lb 12.8 oz (83.8 kg)  04/16/18 184 lb 12.8 oz (83.8 kg)  04/12/18 186 lb (84.4 kg)   Vitals:   11/29/18 0933  BP: (!) 148/80  Pulse: 83  Resp: 16  Temp: 98 F (36.7 C)  TempSrc: Oral  Weight: 184 lb 12.8 oz (83.8 kg)   Physical Exam Constitutional:      General: He is not in acute distress.    Appearance: He is well-developed.  HENT:     Head: Normocephalic and atraumatic.     Right Ear: Hearing and tympanic membrane normal.     Left Ear: Hearing and tympanic membrane normal.     Nose: Nose normal.  Eyes:     General: Lids are normal. No scleral icterus.       Right eye: No discharge.        Left eye: No discharge.     Conjunctiva/sclera: Conjunctivae normal.  Neck:     Musculoskeletal: Normal range of motion and neck supple.  Cardiovascular:     Rate and Rhythm: Normal rate and regular rhythm.  Pulmonary:     Effort: Pulmonary effort is normal. No respiratory distress.     Breath sounds: Normal breath sounds.  Abdominal:     General: Bowel sounds are normal.     Palpations: Abdomen is soft.  Musculoskeletal: Normal range of motion.  Skin:    Findings: No lesion or rash.  Neurological:     Mental Status: He is alert and oriented to person, place, and time.  Psychiatric:        Speech: Speech normal.        Behavior: Behavior normal.        Thought Content: Thought content normal.       Assessment & Plan    1. Benign essential HTN Slightly elevated systolic pressure today. No chest pains, edema, dizziness, headache or palpitations. Tolerating the Lisinopril 20 mg qd without angioedema, cough or hives. Continue to work on salt restriction and stop smoking. Check labs and follow up pending reports. - CBC with Differential/Platelet - Comprehensive metabolic panel - Lipid panel - TSH  2. 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 Recommend low fat diet and will recheck labs. -  Comprehensive metabolic panel - Lipid panel - TSH  3. Current tobacco use Counseled regarding smoking cessation plan. Chronic bronchitis much better on the Symbicort BID daily and Tudorza BID with Ventolin-HFA prn rescue. With BP slightly elevated today, he should work on total cessation of all smoking. Still getting 1 ppd now.     Vernie Murders, PA  [facility]

## 2018-12-03 LAB — CBC WITH DIFFERENTIAL/PLATELET
Basophils Absolute: 0.1 10*3/uL (ref 0.0–0.2)
Basos: 1 %
EOS (ABSOLUTE): 0.2 10*3/uL (ref 0.0–0.4)
Eos: 5 %
Hematocrit: 43.4 % (ref 37.5–51.0)
Hemoglobin: 15.1 g/dL (ref 13.0–17.7)
IMMATURE GRANULOCYTES: 0 %
Immature Grans (Abs): 0 10*3/uL (ref 0.0–0.1)
Lymphocytes Absolute: 2 10*3/uL (ref 0.7–3.1)
Lymphs: 39 %
MCH: 31.1 pg (ref 26.6–33.0)
MCHC: 34.8 g/dL (ref 31.5–35.7)
MCV: 89 fL (ref 79–97)
Monocytes Absolute: 0.5 10*3/uL (ref 0.1–0.9)
Monocytes: 11 %
Neutrophils Absolute: 2.3 10*3/uL (ref 1.4–7.0)
Neutrophils: 44 %
Platelets: 169 10*3/uL (ref 150–450)
RBC: 4.86 x10E6/uL (ref 4.14–5.80)
RDW: 12.5 % (ref 11.6–15.4)
WBC: 5.2 10*3/uL (ref 3.4–10.8)

## 2018-12-03 LAB — COMPREHENSIVE METABOLIC PANEL
ALT: 16 IU/L (ref 0–44)
AST: 19 IU/L (ref 0–40)
Albumin/Globulin Ratio: 1.4 (ref 1.2–2.2)
Albumin: 4.1 g/dL (ref 3.8–4.8)
Alkaline Phosphatase: 73 IU/L (ref 39–117)
BUN/Creatinine Ratio: 16 (ref 10–24)
BUN: 14 mg/dL (ref 8–27)
Bilirubin Total: 0.3 mg/dL (ref 0.0–1.2)
CALCIUM: 9 mg/dL (ref 8.6–10.2)
CO2: 24 mmol/L (ref 20–29)
Chloride: 104 mmol/L (ref 96–106)
Creatinine, Ser: 0.85 mg/dL (ref 0.76–1.27)
GFR calc Af Amer: 107 mL/min/{1.73_m2} (ref >59)
GFR calc non Af Amer: 93 mL/min/{1.73_m2} (ref >59)
Globulin, Total: 3 g/dL (ref 1.5–4.5)
Glucose: 82 mg/dL (ref 65–99)
Potassium: 4.5 mmol/L (ref 3.5–5.2)
Sodium: 141 mmol/L (ref 134–144)
Total Protein: 7.1 g/dL (ref 6.0–8.5)

## 2018-12-03 LAB — LIPID PANEL
CHOL/HDL RATIO: 3.1 ratio (ref 0.0–5.0)
Cholesterol, Total: 151 mg/dL (ref 100–199)
HDL: 49 mg/dL (ref >39)
LDL Calculated: 93 mg/dL (ref 0–99)
Triglycerides: 46 mg/dL (ref 0–149)
VLDL Cholesterol Cal: 9 mg/dL (ref 5–40)

## 2018-12-03 LAB — TSH: TSH: 1.26 u[IU]/mL (ref 0.450–4.500)

## 2018-12-05 ENCOUNTER — Telehealth: Payer: Self-pay

## 2018-12-05 NOTE — Telephone Encounter (Signed)
Patient was advised.  

## 2018-12-05 NOTE — Telephone Encounter (Signed)
-----   Message from [place], [place] sent at 12/05/2018 10:29 AM EST ----- All labs normal. Continue present medications and recheck levels in 6 months. Continue to work on smoking cessation program.

## 2019-02-26 ENCOUNTER — Other Ambulatory Visit: Payer: Self-pay | Admitting: Family Medicine

## 2019-02-26 DIAGNOSIS — J441 Chronic obstructive pulmonary disease with (acute) exacerbation: Secondary | ICD-10-CM

## 2019-05-21 ENCOUNTER — Other Ambulatory Visit: Payer: Self-pay | Admitting: Family Medicine

## 2019-05-21 DIAGNOSIS — J441 Chronic obstructive pulmonary disease with (acute) exacerbation: Secondary | ICD-10-CM

## 2019-06-22 ENCOUNTER — Other Ambulatory Visit: Payer: Self-pay | Admitting: Family Medicine

## 2019-06-22 DIAGNOSIS — I10 Essential (primary) hypertension: Secondary | ICD-10-CM

## 2019-07-25 ENCOUNTER — Other Ambulatory Visit: Payer: Self-pay | Admitting: Family Medicine

## 2019-07-25 DIAGNOSIS — J441 Chronic obstructive pulmonary disease with (acute) exacerbation: Secondary | ICD-10-CM

## 2019-09-23 ENCOUNTER — Other Ambulatory Visit: Payer: Self-pay | Admitting: Family Medicine

## 2019-09-23 DIAGNOSIS — I10 Essential (primary) hypertension: Secondary | ICD-10-CM

## 2019-10-06 ENCOUNTER — Other Ambulatory Visit: Payer: Self-pay | Admitting: Family Medicine

## 2019-10-06 DIAGNOSIS — J441 Chronic obstructive pulmonary disease with (acute) exacerbation: Secondary | ICD-10-CM

## 2019-12-19 ENCOUNTER — Other Ambulatory Visit: Payer: Self-pay | Admitting: Family Medicine

## 2019-12-19 DIAGNOSIS — J441 Chronic obstructive pulmonary disease with (acute) exacerbation: Secondary | ICD-10-CM

## 2019-12-19 NOTE — Telephone Encounter (Signed)
Requested Prescriptions  Pending Prescriptions Disp Refills  . budesonide-formoterol (SYMBICORT) 160-4.5 MCG/ACT inhaler [Pharmacy Med Name: BUDESONIDE-FORMOTER 160-4.5 MCG INH] 10.2 g 0    Sig: INHALE TWO PUFFS BY MOUTH TWICE A DAY     Pulmonology:  Combination Products Failed - 12/19/2019  2:08 PM      Failed - Valid encounter within last 12 months    Recent Outpatient Visits          1 year ago Benign essential HTN   [place]   1 year ago Breathlessness on exertion   [facility], Vickki Muff, [place]   1 year ago [facility], [place], [place]   2 years ago Acute bronchitis with COPD [facility])   [facility], Vickki Muff, [place]   2 years ago Chronic cough   [place], [place]             Called patient's mobile number and left a message for patient to call MD office to make follow-up appt.  This is courtesy refill.

## 2019-12-31 NOTE — Progress Notes (Signed)
Joe Dillon  MRN: [id number] DOB: 10/17/1956  Subjective:  HPI   The patient is a 64 year old male who presents for follow up to get medications refilled.  He was last seen on 11/29/18.   Hypertension, follow-up:  BP Readings from Last 3 Encounters:  01/01/20 (!) 178/106  11/29/18 (!) 148/80  04/16/18 (!) 148/84    BP at that visit was as above. Management since that visit includes no changes He reports poor compliance with treatment. Patient states he has not taken Lisinopril in 2-3 months. He is not having side effects.     Outside blood pressures are not being checked. Cardiovascular risk factors include advanced age (older than 58 for men, 58 for women), dyslipidemia and smoking/ tobacco exposure.  Use of agents associated with hypertension: none.     Weight trend: stable Wt Readings from Last 3 Encounters:  01/01/20 185 lb 12.8 oz (84.3 kg)  11/29/18 184 lb 12.8 oz (83.8 kg)  04/16/18 184 lb 12.8 oz (83.8 kg)     Lipid/Cholesterol, Follow-up:   Management changes since that visit include none.  Last Lipid Panel:    Component Value Date/Time   CHOL 151 12/02/2018 0000   TRIG 46 12/02/2018 0000   HDL 49 12/02/2018 0000   CHOLHDL 3.1 12/02/2018 0000   LDLCALC 93 12/02/2018 0000    Risk factors for vascular disease include hypertension and smoking  He reports good compliance with treatment. He is not having side effects.     Health maintenance reveals patient needs PSA screening and Flu vaccine updating  Patient Active Problem List   Diagnosis Date Noted  . Localized primary osteoarthrosis of carpometacarpal joint of right wrist 11/02/2016  . Mixed simple and mucopurulent chronic bronchitis ([place]) 10/28/2015  . Chronic cough 05/25/2015  . Backhand tennis elbow 05/25/2015  . Snores 05/25/2015  . ST segment depression 05/25/2015  . Synovitis and tenosynovitis 05/25/2015  . Tenosynovitis of hand 05/25/2015  . Irregular cardiac rhythm 12/16/2014    . Abnormal ECG 09/11/2014  . Benign essential HTN 09/11/2014  . Chest pain 09/11/2014  . Fatigue 09/11/2014  . Combined fat and carbohydrate induced hyperlipemia 09/11/2014  . Breathlessness on exertion 09/11/2014  . Current tobacco use 09/11/2014  . Thrombosed hemorrhoids 05/16/2010  . Blood in feces 02/24/2010    Past Medical History:  Diagnosis Date  . Chronic cough   . Hemorrhoids, thrombosed 05/16/2010  . ST segment depression   . Tendinitis of elbow    Past Surgical History:  Procedure Laterality Date  . INGUINAL HERNIA REPAIR Right 2000   Right Inguilal Herniorrhaphy   . INGUINAL HERNIA REPAIR Left 2001   Left Inguinal Herniorrhaphy   Social History   Socioeconomic History  . Marital status: Married    Spouse name: Not on file  . Number of children: Not on file  . Years of education: Not on file  . Highest education level: Not on file  Occupational History  . Not on file  Tobacco Use  . Smoking status: Current Every Day Smoker    Packs/day: 1.00    Years: 39.00    Pack years: 39.00    Types: Cigarettes  . Smokeless tobacco: Current User    Types: Chew  Substance and Sexual Activity  . Alcohol use: Yes    Alcohol/week: 0.0 standard drinks    Comment: occaionally  . Drug use: No  . Sexual activity: Not on file  Other Topics Concern  . Not on file  Social History Narrative  . Not on file   Social Determinants of Health   Financial Resource Strain:   . Difficulty of Paying Living Expenses: Not on file  Food Insecurity:   . Worried About Charity fundraiser in the Last Year: Not on file  . Ran Out of Food in the Last Year: Not on file  Transportation Needs:   . Lack of Transportation (Medical): Not on file  . Lack of Transportation (Non-Medical): Not on file  Physical Activity:   . Days of Exercise per Week: Not on file  . Minutes of Exercise per Session: Not on file  Stress:   . Feeling of Stress : Not on file  Social Connections:   .  Frequency of Communication with Friends and Family: Not on file  . Frequency of Social Gatherings with Friends and Family: Not on file  . Attends Religious Services: Not on file  . Active Member of Clubs or Organizations: Not on file  . Attends Archivist Meetings: Not on file  . Marital Status: Not on file  Intimate Partner Violence:   . Fear of Current or Ex-Partner: Not on file  . Emotionally Abused: Not on file  . Physically Abused: Not on file  . Sexually Abused: Not on file    Outpatient Encounter Medications as of 01/01/2020  Medication Sig  . acetaminophen (TYLENOL) 325 MG tablet Take 650 mg by mouth every 6 (six) hours as needed. Reported on 10/28/2015  . Aclidinium Bromide 400 MCG/ACT AEPB Inhale 400 mcg into the lungs 2 (two) times daily.  Marland Kitchen albuterol (PROVENTIL HFA;VENTOLIN HFA) 108 (90 Base) MCG/ACT inhaler Inhale 2 puffs into the lungs every 6 (six) hours as needed for wheezing or shortness of breath.  . budesonide-formoterol (SYMBICORT) 160-4.5 MCG/ACT inhaler INHALE TWO PUFFS BY MOUTH TWICE A DAY  . lisinopril (ZESTRIL) 20 MG tablet TAKE ONE TABLET BY MOUTH DAILY  . meloxicam (MOBIC) 15 MG tablet Mobic 15 mg tablet  Take 1 tablet every day by oral route.  . [DISCONTINUED] levofloxacin (LEVAQUIN) 500 MG tablet Take 1 tablet (500 mg total) by mouth daily. (Patient not taking: Reported on 11/29/2018)   Facility-Administered Encounter Medications as of 01/01/2020  Medication  . betamethasone acetate-betamethasone sodium phosphate (CELESTONE) injection 3 mg  . betamethasone acetate-betamethasone sodium phosphate (CELESTONE) injection 3 mg   No Known Allergies  Review of Systems  Constitutional: Negative.   Respiratory: Negative.   Cardiovascular: Negative.     Objective:  BP (!) 178/106 (BP Location: Right Arm, Patient Position: Sitting, Cuff Size: Normal)   Pulse 81   Temp (!) 97.3 F (36.3 C) (Temporal)   Ht 5\' 9"  (1.753 m)   Wt 185 lb 12.8 oz (84.3 kg)    BMI 27.44 kg/m    Physical Exam  Constitutional: He is oriented to person, place, and time and well-developed, well-nourished, and in no distress.  HENT:  Head: Normocephalic.  Enlarged plaques on center of tongue with white patches.  Eyes: Conjunctivae are normal.  Cardiovascular: Normal rate.  Pulmonary/Chest: Effort normal and breath sounds normal.  Abdominal: Soft. Bowel sounds are normal.  Musculoskeletal:        General: Normal range of motion.     Cervical back: Neck supple.  Neurological: He is alert and oriented to person, place, and time.  Skin: No rash noted.  Psychiatric: Mood, affect and judgment normal.    Assessment and Plan :  1. Benign essential HTN BP high today. No chest  pains, dizziness, peripheral edema, dyspnea or palpitations. Should restrict sodium intake and stop smoking. Recheck routine labs and refill Lisinopril 20 mg qd. Follow up pending lab reports. Must take BP meds DAILY. - CBC with Differential/Platelet - Comprehensive metabolic panel - TSH - lisinopril (ZESTRIL) 20 MG tablet; TAKE ONE TABLET BY MOUTH DAILY  Dispense: 90 tablet; Refill: 4  2. Current tobacco use Still smoking 1+ ppd the past 39 years with COPD. Breathing stable on the Symbicort 160-4.5 mcg/ inh 2 puffs BID and Albuterol-HFA prn wheeze. Recommend he stop all smoking and consider LDCT screening.  - CBC with Differential/Platelet - Comprehensive metabolic panel - Lipid Panel With LDL/HDL Ratio  3. Combined fat and carbohydrate induced hyperlipemia Recheck lipid panel. - Lipid Panel With LDL/HDL Ratio  4. Prostate cancer screening - PSA  5. Glossitis States he has had irritation to his tongue for the past year. No unusual acute respiratory symptoms. mouthwash some help. Will treat white patches on tongue with Mycelex lozenges. Recheck prn. - clotrimazole (MYCELEX) 10 MG troche; Take 1 tablet (10 mg total) by mouth 5 (five) times daily.  Dispense: 24 Troche; Refill: 0

## 2020-01-01 ENCOUNTER — Ambulatory Visit (INDEPENDENT_AMBULATORY_CARE_PROVIDER_SITE_OTHER): Payer: Medicare HMO | Admitting: Family Medicine

## 2020-01-01 ENCOUNTER — Encounter: Payer: Self-pay | Admitting: Family Medicine

## 2020-01-01 ENCOUNTER — Other Ambulatory Visit: Payer: Self-pay

## 2020-01-01 VITALS — BP 178/106 | HR 81 | Temp 97.3°F | Ht 69.0 in | Wt 185.8 lb

## 2020-01-01 DIAGNOSIS — K140 Glossitis: Secondary | ICD-10-CM | POA: Diagnosis not present

## 2020-01-01 DIAGNOSIS — E782 Mixed hyperlipidemia: Secondary | ICD-10-CM | POA: Diagnosis not present

## 2020-01-01 DIAGNOSIS — Z125 Encounter for screening for malignant neoplasm of prostate: Secondary | ICD-10-CM | POA: Diagnosis not present

## 2020-01-01 DIAGNOSIS — Z720 Tobacco use: Secondary | ICD-10-CM

## 2020-01-01 DIAGNOSIS — I10 Essential (primary) hypertension: Secondary | ICD-10-CM

## 2020-01-01 MED ORDER — CLOTRIMAZOLE 10 MG MT TROC: 10.0000 mg | Freq: Every day | OROMUCOSAL | 0 refills | Status: DC

## 2020-01-01 MED ORDER — LISINOPRIL 20 MG PO TABS: ORAL_TABLET | ORAL | 4 refills | Status: DC

## 2020-01-05 DIAGNOSIS — Z720 Tobacco use: Secondary | ICD-10-CM | POA: Diagnosis not present

## 2020-01-05 DIAGNOSIS — E782 Mixed hyperlipidemia: Secondary | ICD-10-CM | POA: Diagnosis not present

## 2020-01-05 DIAGNOSIS — Z125 Encounter for screening for malignant neoplasm of prostate: Secondary | ICD-10-CM | POA: Diagnosis not present

## 2020-01-05 DIAGNOSIS — I10 Essential (primary) hypertension: Secondary | ICD-10-CM | POA: Diagnosis not present

## 2020-01-06 LAB — COMPREHENSIVE METABOLIC PANEL
ALT: 17 IU/L (ref 0–44)
AST: 18 IU/L (ref 0–40)
Albumin/Globulin Ratio: 1.6 (ref 1.2–2.2)
Albumin: 4.3 g/dL (ref 3.8–4.8)
Alkaline Phosphatase: 86 IU/L (ref 39–117)
BUN/Creatinine Ratio: 11 (ref 10–24)
BUN: 9 mg/dL (ref 8–27)
Bilirubin Total: 0.2 mg/dL (ref 0.0–1.2)
CO2: 24 mmol/L (ref 20–29)
Calcium: 9 mg/dL (ref 8.6–10.2)
Chloride: 102 mmol/L (ref 96–106)
Creatinine, Ser: 0.84 mg/dL (ref 0.76–1.27)
GFR calc Af Amer: 106 mL/min/{1.73_m2} (ref >59)
GFR calc non Af Amer: 92 mL/min/{1.73_m2} (ref >59)
Globulin, Total: 2.7 g/dL (ref 1.5–4.5)
Glucose: 88 mg/dL (ref 65–99)
Potassium: 4.5 mmol/L (ref 3.5–5.2)
Sodium: 140 mmol/L (ref 134–144)
Total Protein: 7 g/dL (ref 6.0–8.5)

## 2020-01-06 LAB — LIPID PANEL WITH LDL/HDL RATIO
Cholesterol, Total: 152 mg/dL (ref 100–199)
HDL: 57 mg/dL (ref >39)
LDL Chol Calc (NIH): 87 mg/dL (ref 0–99)
LDL/HDL Ratio: 1.5 ratio (ref 0.0–3.6)
Triglycerides: 35 mg/dL (ref 0–149)
VLDL Cholesterol Cal: 8 mg/dL (ref 5–40)

## 2020-01-06 LAB — CBC WITH DIFFERENTIAL/PLATELET
Basophils Absolute: 0.1 10*3/uL (ref 0.0–0.2)
Basos: 1 %
EOS (ABSOLUTE): 0.3 10*3/uL (ref 0.0–0.4)
Eos: 6 %
Hematocrit: 41.8 % (ref 37.5–51.0)
Hemoglobin: 14.1 g/dL (ref 13.0–17.7)
Immature Grans (Abs): 0 10*3/uL (ref 0.0–0.1)
Immature Granulocytes: 0 %
Lymphocytes Absolute: 1.7 10*3/uL (ref 0.7–3.1)
Lymphs: 31 %
MCH: 30.7 pg (ref 26.6–33.0)
MCHC: 33.7 g/dL (ref 31.5–35.7)
MCV: 91 fL (ref 79–97)
Monocytes Absolute: 0.6 10*3/uL (ref 0.1–0.9)
Monocytes: 12 %
Neutrophils Absolute: 2.8 10*3/uL (ref 1.4–7.0)
Neutrophils: 50 %
Platelets: 180 10*3/uL (ref 150–450)
RBC: 4.6 x10E6/uL (ref 4.14–5.80)
RDW: 12.5 % (ref 11.6–15.4)
WBC: 5.4 10*3/uL (ref 3.4–10.8)

## 2020-01-06 LAB — TSH: TSH: 1.56 u[IU]/mL (ref 0.450–4.500)

## 2020-01-06 LAB — PSA: Prostate Specific Ag, Serum: 1.7 ng/mL (ref 0.0–4.0)

## 2020-01-30 ENCOUNTER — Other Ambulatory Visit: Payer: Self-pay | Admitting: Family Medicine

## 2020-01-30 DIAGNOSIS — J441 Chronic obstructive pulmonary disease with (acute) exacerbation: Secondary | ICD-10-CM

## 2020-03-12 ENCOUNTER — Other Ambulatory Visit: Payer: Self-pay | Admitting: Family Medicine

## 2020-03-12 DIAGNOSIS — J441 Chronic obstructive pulmonary disease with (acute) exacerbation: Secondary | ICD-10-CM

## 2020-03-12 NOTE — Telephone Encounter (Signed)
Requested Prescriptions  Pending Prescriptions Disp Refills  . budesonide-formoterol (SYMBICORT) 160-4.5 MCG/ACT inhaler [Pharmacy Med Name: BUDESONIDE-FORMOTER 160-4.5 MCG INH] 10.2 g 0    Sig: INHALE TWO PUFFS BY MOUTH TWICE A DAY     Pulmonology:  Combination Products Passed - 03/12/2020  8:13 AM      Passed - Valid encounter within last 12 months    Recent Outpatient Visits          2 months ago Benign essential HTN   [place], Vickki Muff, [place]   1 year ago Benign essential HTN   [facility], Vickki Muff, [place]   1 year ago Breathlessness on exertion   [facility], Vickki Muff, [place]   1 year ago [facility], [place], [place]   2 years ago Acute bronchitis with COPD [facility])   [place], Vickki Muff, [place]

## 2020-04-17 ENCOUNTER — Other Ambulatory Visit: Payer: Self-pay | Admitting: Family Medicine

## 2020-04-17 DIAGNOSIS — J441 Chronic obstructive pulmonary disease with (acute) exacerbation: Secondary | ICD-10-CM

## 2020-04-17 NOTE — Telephone Encounter (Signed)
Requested Prescriptions  Pending Prescriptions Disp Refills  . budesonide-formoterol (SYMBICORT) 160-4.5 MCG/ACT inhaler [Pharmacy Med Name: BUDESONIDE-FORMOTER 160-4.5 MCG INH] 10.2 g 0    Sig: INHALE TWO PUFFS BY MOUTH TWICE A DAY     Pulmonology:  Combination Products Passed - 04/17/2020  3:42 PM      Passed - Valid encounter within last 12 months    Recent Outpatient Visits          3 months ago Benign essential HTN   [place], Vickki Muff, [place]   1 year ago Benign essential HTN   [place], [place]   2 years ago Breathlessness on exertion   [facility], Vickki Muff, [place]   2 years ago [facility], [place], [place]   2 years ago Acute bronchitis with COPD [facility])   [place], Vickki Muff, [place]

## 2020-05-16 ENCOUNTER — Other Ambulatory Visit: Payer: Self-pay | Admitting: Family Medicine

## 2020-05-16 DIAGNOSIS — K140 Glossitis: Secondary | ICD-10-CM

## 2020-05-16 DIAGNOSIS — J441 Chronic obstructive pulmonary disease with (acute) exacerbation: Secondary | ICD-10-CM

## 2020-05-16 NOTE — Telephone Encounter (Signed)
Requested Prescriptions  Pending Prescriptions Disp Refills  . clotrimazole (MYCELEX) 10 MG troche [Pharmacy Med Name: CLOTRIMAZOLE 10 MG TROCHE] 24 Troche     Sig: TAKE ONE TABLET BY MOUTH FIVE TIMES DAILY     Off-Protocol Failed - 05/16/2020  6:23 PM      Failed - Medication not assigned to a protocol, review manually.      Passed - Valid encounter within last 12 months    Recent Outpatient Visits          4 months ago Benign essential HTN   [place], Vickki Muff, [place]   1 year ago Benign essential HTN   [place], [place]   2 years ago Breathlessness on exertion   [facility], Vickki Muff, [place]   2 years ago [place], [place]   2 years ago Acute bronchitis with COPD [facility])   [place], Vickki Muff, [place]             . budesonide-formoterol (SYMBICORT) 160-4.5 MCG/ACT inhaler [Pharmacy Med Name: BUDESONIDE-FORMOTER 160-4.5 MCG INH] 10.2 g 0    Sig: INHALE TWO PUFFS BY MOUTH TWICE A DAY     Pulmonology:  Combination Products Passed - 05/16/2020  6:23 PM      Passed - Valid encounter within last 12 months    Recent Outpatient Visits          4 months ago Benign essential HTN   [place], Vickki Muff, [place]   1 year ago Benign essential HTN   [place], [place]   2 years ago Breathlessness on exertion   [facility], Vickki Muff, [place]   2 years ago [facility], [place], [place]   2 years ago Acute bronchitis with COPD [facility])   [place], Vickki Muff, [place]

## 2020-05-16 NOTE — Telephone Encounter (Signed)
Requested medication (s) are due for refill today: yes  Requested medication (s) are on the active medication list: med  Last refill:  01/01/20  Future visit scheduled: no  Notes to clinic:  med not assigned to a protocol, review manually (was using for white patches on tongue)/may need f/u?   Requested Prescriptions  Pending Prescriptions Disp Refills   clotrimazole (MYCELEX) 10 MG troche [Pharmacy Med Name: CLOTRIMAZOLE 10 MG TROCHE] 24 Troche     Sig: TAKE ONE TABLET BY MOUTH FIVE TIMES DAILY      Off-Protocol Failed - 05/16/2020  6:23 PM      Failed - Medication not assigned to a protocol, review manually.      Passed - Valid encounter within last 12 months    Recent Outpatient Visits           4 months ago Benign essential HTN   [place], Vickki Muff, [place]   1 year ago Benign essential HTN   [facility], Vickki Muff, [place]   2 years ago Breathlessness on exertion   [facility], Vickki Muff, [place]   2 years ago [facility], [place], [place]   2 years ago Acute bronchitis with COPD [facility])   [facility], [place], [place]               Signed Prescriptions Disp Refills   budesonide-formoterol (SYMBICORT) 160-4.5 MCG/ACT inhaler 10.2 g 0    Sig: INHALE TWO PUFFS BY MOUTH TWICE A DAY      Pulmonology:  Combination Products Passed - 05/16/2020  6:23 PM      Passed - Valid encounter within last 12 months    Recent Outpatient Visits           4 months ago Benign essential HTN   [place], Vickki Muff, [place]   1 year ago Benign essential HTN   [place], [place]   2 years ago Breathlessness on exertion   [facility], Vickki Muff, [place]   2 years ago [facility], [place], [place]   2 years ago Acute bronchitis with COPD [facility])   [place],  Vickki Muff, [place]

## 2020-06-12 ENCOUNTER — Other Ambulatory Visit: Payer: Self-pay | Admitting: Family Medicine

## 2020-06-12 DIAGNOSIS — J441 Chronic obstructive pulmonary disease with (acute) exacerbation: Secondary | ICD-10-CM

## 2020-06-12 NOTE — Telephone Encounter (Signed)
Requested Prescriptions  Pending Prescriptions Disp Refills   SYMBICORT 160-4.5 MCG/ACT inhaler [Pharmacy Med Name: SYMBICORT 160-4.5 MCG INHALER] 10.2 g 0    Sig: INHALE TWO PUFFS BY MOUTH TWICE A DAY     Pulmonology:  Combination Products Passed - 06/12/2020  3:39 PM      Passed - Valid encounter within last 12 months    Recent Outpatient Visits          5 months ago Benign essential HTN   [place], Vickki Muff, [place]   1 year ago Benign essential HTN   [facility], Vickki Muff, [place]   2 years ago Breathlessness on exertion   [facility], Vickki Muff, [place]   2 years ago [place], [place]   2 years ago Acute bronchitis with COPD [facility])   [place], Vickki Muff, [place]

## 2020-07-12 ENCOUNTER — Other Ambulatory Visit: Payer: Self-pay | Admitting: Family Medicine

## 2020-07-12 DIAGNOSIS — J441 Chronic obstructive pulmonary disease with (acute) exacerbation: Secondary | ICD-10-CM

## 2020-08-06 ENCOUNTER — Other Ambulatory Visit: Payer: Self-pay | Admitting: Family Medicine

## 2020-08-06 DIAGNOSIS — J441 Chronic obstructive pulmonary disease with (acute) exacerbation: Secondary | ICD-10-CM

## 2020-09-06 ENCOUNTER — Other Ambulatory Visit: Payer: Self-pay | Admitting: Family Medicine

## 2020-09-06 DIAGNOSIS — J441 Chronic obstructive pulmonary disease with (acute) exacerbation: Secondary | ICD-10-CM

## 2020-10-09 ENCOUNTER — Other Ambulatory Visit: Payer: Self-pay | Admitting: Family Medicine

## 2020-10-09 DIAGNOSIS — J441 Chronic obstructive pulmonary disease with (acute) exacerbation: Secondary | ICD-10-CM

## 2020-10-09 NOTE — Telephone Encounter (Signed)
Requested Prescriptions  Pending Prescriptions Disp Refills  . SYMBICORT 160-4.5 MCG/ACT inhaler [Pharmacy Med Name: SYMBICORT 160-4.5 MCG INHALER] 10.2 g 0    Sig: INHALE TWO PUFFS BY MOUTH TWICE A DAY     Pulmonology:  Combination Products Passed - 10/09/2020 10:02 AM      Passed - Valid encounter within last 12 months    Recent Outpatient Visits          9 months ago Benign essential HTN   [place], Vickki Muff, [place]   1 year ago Benign essential HTN   [facility], Vickki Muff, [place]   2 years ago Breathlessness on exertion   [facility], Vickki Muff, [place]   2 years ago [facility], [place], [place]   2 years ago Acute bronchitis with COPD [facility])   [place], Vickki Muff, [place]

## 2020-11-03 ENCOUNTER — Other Ambulatory Visit: Payer: Self-pay | Admitting: Family Medicine

## 2020-11-03 DIAGNOSIS — K140 Glossitis: Secondary | ICD-10-CM

## 2020-11-03 NOTE — Telephone Encounter (Signed)
Requested medication (s) are due for refill today: no  Requested medication (s) are on the active medication list: yes  Last refill: 05/17/2020  Future visit scheduled: no  Notes to clinic:   Medication not assigned to a protocol, review manually  Requested Prescriptions  Pending Prescriptions Disp Refills   clotrimazole (MYCELEX) 10 MG troche [Pharmacy Med Name: CLOTRIMAZOLE 10 MG TROCHE] 24 Troche 0    Sig: TAKE ONE TABLET BY MOUTH FIVE TIMES DAILY      Off-Protocol Failed - 11/03/2020  5:30 PM      Failed - Medication not assigned to a protocol, review manually.      Passed - Valid encounter within last 12 months    Recent Outpatient Visits           10 months ago Benign essential HTN   [facility] [place], Jodell Cipro, PA-C   1 year ago Benign essential HTN   [facility], Jodell Cipro, PA-C   2 years ago Breathlessness on exertion   [facility], Jodell Cipro, PA-C   2 years ago [facility], Jodell Cipro, PA-C   2 years ago Acute bronchitis with COPD [facility])   [facility] Chrismon, Jodell Cipro, [place]

## 2020-11-09 ENCOUNTER — Telehealth (INDEPENDENT_AMBULATORY_CARE_PROVIDER_SITE_OTHER): Payer: Medicare HMO | Admitting: Physician Assistant

## 2020-11-09 DIAGNOSIS — J441 Chronic obstructive pulmonary disease with (acute) exacerbation: Secondary | ICD-10-CM

## 2020-11-09 DIAGNOSIS — J449 Chronic obstructive pulmonary disease, unspecified: Secondary | ICD-10-CM | POA: Diagnosis not present

## 2020-11-09 MED ORDER — PREDNISONE 10 MG PO TABS: ORAL_TABLET | ORAL | 0 refills | Status: DC

## 2020-11-09 MED ORDER — DOXYCYCLINE HYCLATE 100 MG PO TABS: 100.0000 mg | ORAL_TABLET | Freq: Two times a day (BID) | ORAL | 0 refills | Status: AC

## 2020-11-09 MED ORDER — TRELEGY ELLIPTA 100-62.5-25 MCG/INH IN AEPB: INHALATION_SPRAY | RESPIRATORY_TRACT | 2 refills | Status: DC

## 2020-11-09 NOTE — Progress Notes (Signed)
MyChart Video Visit    Virtual Visit via Video Note   This visit type was conducted due to national recommendations for restrictions regarding the COVID-19 Pandemic (e.g. social distancing) in an effort to limit this patient's exposure and mitigate transmission in our community. This patient is at least at moderate risk for complications without adequate follow up. This format is felt to be most appropriate for this patient at this time. Physical exam was limited by quality of the video and audio technology used for the visit.   Patient location: Home Provider location: office   I discussed the limitations of evaluation and management by telemedicine and the availability of in person appointments. The patient expressed understanding and agreed to proceed.  Patient: Joe Dillon   DOB: 04-29-1956   65 y.o. Male  MRN: [id number] Visit Date: 11/09/2020  Today's healthcare provider: Trey Sailors, PA-C   Chief Complaint  Patient presents with  . Cough   Subjective    HPI  Upper Respiratory Infection: Patient with history of COPD complains of symptoms of a URI starting Thursday. Symptoms include congestion and cough. Onset of symptoms was 5 days ago, rapidly improving since that time. He also c/o shortness of breath for the past 2 days . He does take symbicort daily. He has needed his albuterol more frequently. Reports a slightly productive cough which is a different color than usual.  He is drinking plenty of fluids. Evaluation to date: none. Treatment to date: Mucinex md.   Wife has also noticed some increased SOB with activities like crossing the parking lot. He is taking symbicort daily but feels like he may not be getting the full relief from it.   Patient Active Problem List   Diagnosis Date Noted  . Localized primary osteoarthrosis of carpometacarpal joint of right wrist 11/02/2016  . Mixed simple and mucopurulent chronic bronchitis (HCC) 10/28/2015  . Chronic cough  05/25/2015  . Backhand tennis elbow 05/25/2015  . Snores 05/25/2015  . ST segment depression 05/25/2015  . Synovitis and tenosynovitis 05/25/2015  . Tenosynovitis of hand 05/25/2015  . Irregular cardiac rhythm 12/16/2014  . Abnormal ECG 09/11/2014  . Benign essential HTN 09/11/2014  . Chest pain 09/11/2014  . Fatigue 09/11/2014  . Combined fat and carbohydrate induced hyperlipemia 09/11/2014  . Breathlessness on exertion 09/11/2014  . Current tobacco use 09/11/2014  . Thrombosed hemorrhoids 05/16/2010  . Blood in feces 02/24/2010   Past Medical History:  Diagnosis Date  . Chronic cough   . Hemorrhoids, thrombosed 05/16/2010  . ST segment depression   . Tendinitis of elbow    Family History  Problem Relation Age of Onset  . Alcohol abuse Maternal Grandfather    No Known Allergies    Medications: Outpatient Medications Prior to Visit  Medication Sig  . acetaminophen (TYLENOL) 325 MG tablet Take 650 mg by mouth every 6 (six) hours as needed. Reported on 10/28/2015  . albuterol (PROVENTIL HFA;VENTOLIN HFA) 108 (90 Base) MCG/ACT inhaler Inhale 2 puffs into the lungs every 6 (six) hours as needed for wheezing or shortness of breath.  . clotrimazole (MYCELEX) 10 MG troche TAKE ONE TABLET BY MOUTH FIVE TIMES DAILY  . lisinopril (ZESTRIL) 20 MG tablet TAKE ONE TABLET BY MOUTH DAILY  . meloxicam (MOBIC) 15 MG tablet Mobic 15 mg tablet  Take 1 tablet every day by oral route.  . [DISCONTINUED] Aclidinium Bromide 400 MCG/ACT AEPB Inhale 400 mcg into the lungs 2 (two) times daily.  . [DISCONTINUED]  SYMBICORT 160-4.5 MCG/ACT inhaler INHALE TWO PUFFS BY MOUTH TWICE A DAY   Facility-Administered Medications Prior to Visit  Medication Dose Route Frequency Provider  . betamethasone acetate-betamethasone sodium phosphate (CELESTONE) injection 3 mg  3 mg Intramuscular Once Gala Lewandowsky M, DPM  . betamethasone acetate-betamethasone sodium phosphate (CELESTONE) injection 3 mg  3 mg  Intramuscular Once Felecia Shelling, DPM    Review of Systems    Objective    There were no vitals taken for this visit.   Physical Exam Constitutional:      Appearance: Normal appearance.  Pulmonary:     Effort: Pulmonary effort is normal. No respiratory distress.  Neurological:     Mental Status: He is alert.  Psychiatric:        Mood and Affect: Mood normal.        Behavior: Behavior normal.        Assessment & Plan    1. COPD exacerbation (HCC)  Treat acutely as below.   - doxycycline (VIBRA-TABS) 100 MG tablet; Take 1 tablet (100 mg total) by mouth 2 (two) times daily for 7 days.  Dispense: 14 tablet; Refill: 0 - predniSONE (DELTASONE) 10 MG tablet; Take 6 pills on day 1, 5 pills on day 2 and so on until complete.  Dispense: 21 tablet; Refill: 0  2. Chronic obstructive pulmonary disease, unspecified COPD type (HCC)  Change to triple therapy, may need to fill out PA.   - Fluticasone-Umeclidin-Vilant (TRELEGY ELLIPTA) 100-62.5-25 MCG/INH AEPB; Take one puff daily  Dispense: 60 each; Refill: 2   No follow-ups on file.     I discussed the assessment and treatment plan with the patient. The patient was provided an opportunity to ask questions and all were answered. The patient agreed with the plan and demonstrated an understanding of the instructions.   The patient was advised to call back or seek an in-person evaluation if the symptoms worsen or if the condition fails to improve as anticipated.   ITrey Sailors, PA-C, have reviewed all documentation for this visit. The documentation on 11/09/20 for the exam, diagnosis, procedures, and orders are all accurate and complete.  The entirety of the information documented in the History of Present Illness, Review of Systems and Physical Exam were personally obtained by me. Portions of this information were initially documented by Si Gaul, CMA and reviewed by me for thoroughness and accuracy.       Maryella Shivers [facility] [phone] (phone) [phone] (fax)  [facility]

## 2020-12-21 NOTE — Progress Notes (Addendum)
Subjective:   Joe Dillon is a 65 y.o. male who presents for an Initial Medicare Annual Wellness Visit.  I connected with Chrisean Kloth today by telephone and verified that I am speaking with the correct person using two identifiers. Location patient: home Location provider: work Persons participating in the virtual visit: patient, provider.   I discussed the limitations, risks, security and privacy concerns of performing an evaluation and management service by telephone and the availability of in person appointments. I also discussed with the patient that there may be a patient responsible charge related to this service. The patient expressed understanding and verbally consented to this telephonic visit.    Interactive audio and video telecommunications were attempted between this provider and patient, however failed, due to patient having technical difficulties OR patient did not have access to video capability.  We continued and completed visit with audio only.   Review of Systems    N/A  Cardiac Risk Factors include: advanced age (>8men, >51 women);male gender;smoking/ tobacco exposure;hypertension     Objective:    There were no vitals filed for this visit. There is no height or weight on file to calculate BMI.  Advanced Directives 12/22/2020  Does Patient Have a Medical Advance Directive? Yes  Type of Paramedic of [place];Living will  Copy of [place] in Chart? No - copy requested    Current Medications (verified) Outpatient Encounter Medications as of 12/22/2020  Medication Sig   acetaminophen (TYLENOL) 325 MG tablet Take 650 mg by mouth every 6 (six) hours as needed. Reported on 10/28/2015   Ascorbic Acid (VITAMIN C) 1000 MG tablet Take 1,000 mg by mouth daily.   esomeprazole (NEXIUM) 20 MG capsule Take 20 mg by mouth daily at 12 noon.   Fluticasone-Umeclidin-Vilant (TRELEGY ELLIPTA) 100-62.5-25 MCG/INH AEPB Take one  puff daily   lisinopril (ZESTRIL) 20 MG tablet TAKE ONE TABLET BY MOUTH DAILY   meloxicam (MOBIC) 15 MG tablet Take 15 mg by mouth daily.   Multiple Vitamin (MULTIVITAMIN) tablet Take 1 tablet by mouth daily.   albuterol (PROVENTIL HFA;VENTOLIN HFA) 108 (90 Base) MCG/ACT inhaler Inhale 2 puffs into the lungs every 6 (six) hours as needed for wheezing or shortness of breath. (Patient not taking: Reported on 12/22/2020)   clotrimazole (MYCELEX) 10 MG troche TAKE ONE TABLET BY MOUTH FIVE TIMES DAILY (Patient not taking: Reported on 12/22/2020)   predniSONE (DELTASONE) 10 MG tablet Take 6 pills on day 1, 5 pills on day 2 and so on until complete. (Patient not taking: Reported on 12/22/2020)   Facility-Administered Encounter Medications as of 12/22/2020  Medication   betamethasone acetate-betamethasone sodium phosphate (CELESTONE) injection 3 mg   betamethasone acetate-betamethasone sodium phosphate (CELESTONE) injection 3 mg    Allergies (verified) Patient has no known allergies.   History: Past Medical History:  Diagnosis Date   Chronic cough    Hemorrhoids, thrombosed 05/16/2010   Hypertension    ST segment depression    Tendinitis of elbow    Past Surgical History:  Procedure Laterality Date   INGUINAL HERNIA REPAIR Right 2000   Right Inguilal Herniorrhaphy    INGUINAL HERNIA REPAIR Left 2001   Left Inguinal Herniorrhaphy   Family History  Problem Relation Age of Onset   Alcohol abuse Maternal Grandfather    Social History   Socioeconomic History   Marital status: Married    Spouse name: Not on file   Number of children: 2   Years of education:  Not on file   Highest education level: High school graduate  Occupational History   Occupation: owner  Tobacco Use   Smoking status: Current Every Day Smoker    Packs/day: 1.00    Years: 39.00    Pack years: 39.00    Types: Cigarettes   Smokeless tobacco: Never Used  Vaping Use   Vaping Use: Former  Substance and Sexual  Activity   Alcohol use: Yes    Alcohol/week: 5.0 standard drinks    Types: 5 Cans of beer per week    Comment: 2 times a week   Drug use: No   Sexual activity: Not on file  Other Topics Concern   Not on file  Social History Narrative   Not on file   Social Determinants of Health   Financial Resource Strain: Low Risk    Difficulty of Paying Living Expenses: Not hard at all  Food Insecurity: No Food Insecurity   Worried About Charity fundraiser in the Last Year: Never true   [place] in the Last Year: Never true  Transportation Needs: No Transportation Needs   Lack of Transportation (Medical): No   Lack of Transportation (Non-Medical): No  Physical Activity: Inactive   Days of Exercise per Week: 0 days   Minutes of Exercise per Session: 0 min  Stress: No Stress Concern Present   Feeling of Stress : Not at all  Social Connections: Moderately Integrated   Frequency of Communication with Friends and Family: More than three times a week   Frequency of Social Gatherings with Friends and Family: More than three times a week   Attends Religious Services: Never   Marine scientist or Organizations: Yes   Attends Music therapist: More than 4 times per year   Marital Status: Married    Tobacco Counseling Ready to quit: No Counseling given: No   Clinical Intake:  Pre-visit preparation completed: Yes  Pain : No/denies pain     Nutritional Risks: None Diabetes: No  How often do you need to have someone help you when you read instructions, pamphlets, or other written materials from your doctor or pharmacy?: 1 - Never  Diabetic? No  Interpreter Needed?: No  Information entered by :: [facility], LPN   Activities of Daily Living In your present state of health, do you have any difficulty performing the following activities: 12/22/2020 01/01/2020  Hearing? N N  Vision? N N  Difficulty concentrating or making decisions? N N  Walking or climbing  stairs? Y N  Comment Due to SOB. -  Dressing or bathing? N N  Doing errands, shopping? N N  Preparing Food and eating ? N -  Using the Toilet? N -  In the past six months, have you accidently leaked urine? N -  Do you have problems with loss of bowel control? N -  Managing your Medications? N -  Managing your Finances? N -  Housekeeping or managing your Housekeeping? N -  Some recent data might be hidden    Patient Care Team: Chrismon, Vickki Muff, PA-C as PCP - General (Family Medicine) Earnestine Leys, MD (Orthopedic Surgery)  Indicate any recent Medical Services you may have received from other than [facility] providers in the past year (date may be approximate).     Assessment:   This is a routine wellness examination for Manu.  Hearing/Vision screen No exam data present  Dietary issues and exercise activities discussed: Current Exercise Habits: The patient does  not participate in regular exercise at present, Exercise limited by: None identified  Goals      Quit Smoking     Recommend to continue efforts to reduce smoking habits until no longer smoking.        Depression Screen PHQ 2/9 Scores 12/22/2020 01/01/2020 09/24/2017 06/19/2016  PHQ - 2 Score 0 0 5 0  PHQ- 9 Score - - 14 0    Fall Risk Fall Risk  12/22/2020 01/01/2020  Falls in the past year? 0 0  Number falls in past yr: 0 0  Injury with Fall? 0 0    FALL RISK PREVENTION PERTAINING TO THE HOME:  Any stairs in or around the home? Yes  If so, are there any without handrails? No  Home free of loose throw rugs in walkways, pet beds, electrical cords, etc? Yes  Adequate lighting in your home to reduce risk of falls? Yes   ASSISTIVE DEVICES UTILIZED TO PREVENT FALLS:  Life alert? No  Use of a cane, walker or w/c? No  Grab bars in the bathroom? No  Shower chair or bench in shower? No  Elevated toilet seat or a handicapped toilet? No     Cognitive Function: Declined today.          Immunizations Immunization History  Administered Date(s) Administered   Influenza,inj,Quad PF,6+ Mos 09/14/2016   Pneumococcal Polysaccharide-23 09/04/2014   Tdap 02/24/2010    TDAP status: Due, Education has been provided regarding the importance of this vaccine. Advised may receive this vaccine at local pharmacy or Health Dept. Aware to provide a copy of the vaccination record if obtained from local pharmacy or Health Dept. Verbalized acceptance and understanding.  Flu Vaccine status: Declined, Education has been provided regarding the importance of this vaccine but patient still declined. Advised may receive this vaccine at local pharmacy or Health Dept. Aware to provide a copy of the vaccination record if obtained from local pharmacy or Health Dept. Verbalized acceptance and understanding.  Pneumococcal vaccine status: Due, Education has been provided regarding the importance of this vaccine. Advised may receive this vaccine at local pharmacy or Health Dept. Aware to provide a copy of the vaccination record if obtained from local pharmacy or Health Dept. Verbalized acceptance and understanding.  Covid-19 vaccine status: Declined, Education has been provided regarding the importance of this vaccine but patient still declined. Advised may receive this vaccine at local pharmacy or Health Dept.or vaccine clinic. Aware to provide a copy of the vaccination record if obtained from local pharmacy or Health Dept. Verbalized acceptance and understanding.  Qualifies for Shingles Vaccine? Yes   Zostavax completed No   Shingrix Completed?: No.    Education has been provided regarding the importance of this vaccine. Patient has been advised to call insurance company to determine out of pocket expense if they have not yet received this vaccine. Advised may also receive vaccine at local pharmacy or Health Dept. Verbalized acceptance and understanding.  Screening Tests Health Maintenance  Topic Date Due    COLONOSCOPY (Pts 45-60yrs Insurance coverage will need to be confirmed)  12/25/2019   PNA vac Low Risk Adult (1 of 2 - PCV13) 01/02/2020   COVID-19 Vaccine (1) 01/07/2021 (Originally 01/02/1961)   INFLUENZA VACCINE  02/03/2021 (Originally 06/06/2020)   TETANUS/TDAP  12/22/2021 (Originally 02/25/2020)   Hepatitis C Screening  Completed   HIV Screening  Completed    Health Maintenance  Health Maintenance Due  Topic Date Due   COLONOSCOPY (Pts 45-65yrs Insurance coverage will  need to be confirmed)  12/25/2019   PNA vac Low Risk Adult (1 of 2 - PCV13) 01/02/2020    Colorectal cancer screening: Currently due. Declined a colonoscopy referral or cologuard order at this time.   Lung Cancer Screening: (Low Dose CT Chest recommended if Age 15-80 years, 30 pack-year currently smoking OR have quit w/in 15years.) does qualify.   Lung Cancer Screening Referral: An Epic message has been sent to Burgess Estelle, RN (Oncology Nurse Navigator) regarding the possible need for this exam. Raquel Sarna will review the patient's chart to determine if the patient truly qualifies for the exam. If the patient qualifies, Raquel Sarna will order the Low Dose CT of the chest to facilitate the scheduling of this exam.  Additional Screening:  Hepatitis C Screening: Up to date  Vision Screening: Recommended annual ophthalmology exams for early detection of glaucoma and other disorders of the eye. Is the patient up to date with their annual eye exam? No Who is the provider or what is the name of the office in which the patient attends annual eye exams? N/A If pt is not established with a provider, would they like to be referred to a provider to establish care? No .   Dental Screening: Recommended annual dental exams for proper oral hygiene  Community Resource Referral / Chronic Care Management: CRR required this visit?  No   CCM required this visit?  No      Plan:     I have personally reviewed and noted the following in  the patient's chart:   Medical and social history Use of alcohol, tobacco or illicit drugs  Current medications and supplements Functional ability and status Nutritional status Physical activity Advanced directives List of other physicians Hospitalizations, surgeries, and ER visits in previous 12 months Vitals Screenings to include cognitive, depression, and falls Referrals and appointments  In addition, I have reviewed and discussed with patient certain preventive protocols, quality metrics, and best practice recommendations. A written personalized care plan for preventive services as well as general preventive health recommendations were provided to patient.     Celesta Funderburk [place], [place]   [id number]   Nurse Notes: Pt would like to receive a Prevnar 13 vaccine at next in office apt. Pt declined a colonoscopy referral, cologuard order or a future flu and Covid vaccine.   Reviewed screening note and recommendations of Nurse Health Advisor. Agree with documentation and plan.

## 2020-12-22 ENCOUNTER — Other Ambulatory Visit: Payer: Self-pay

## 2020-12-22 ENCOUNTER — Ambulatory Visit (INDEPENDENT_AMBULATORY_CARE_PROVIDER_SITE_OTHER): Payer: Medicare HMO

## 2020-12-22 DIAGNOSIS — Z0000 Encounter for general adult medical examination without abnormal findings: Secondary | ICD-10-CM | POA: Diagnosis not present

## 2020-12-22 NOTE — Patient Instructions (Signed)
Joe Dillon , Thank you for taking time to come for your Medicare Wellness Visit. I appreciate your ongoing commitment to your health goals. Please review the following plan we discussed and let me know if I can assist you in the future.   Screening recommendations/referrals: Colonoscopy: Currently due. Declined a colonoscopy referral or cologuard order at this time.  Recommended yearly ophthalmology/optometry visit for glaucoma screening and checkup Recommended yearly dental visit for hygiene and checkup  Vaccinations: Influenza vaccine: Currently due, declined receiving this season.  Pneumococcal vaccine: Prevnar 13 due. Will receive at next in office apt.  Tdap vaccine: Currently due, declined receiving.  Shingles vaccine: Shingrix discussed. Please contact your pharmacy for coverage information.     Advanced directives: Please bring a copy of your POA (Power of Attorney) and/or Living Will to your next appointment.   Conditions/risks identified: Smoking cessation discussed today.   Next appointment: 02/28/21 @ 10:40 AM with [place] 65 Years and Older, Male Preventive care refers to lifestyle choices and visits with your health care provider that can promote health and wellness. What does preventive care include?  A yearly physical exam. This is also called an annual well check.  Dental exams once or twice a year.  Routine eye exams. Ask your health care provider how often you should have your eyes checked.  Personal lifestyle choices, including:  Daily care of your teeth and gums.  Regular physical activity.  Eating a healthy diet.  Avoiding tobacco and drug use.  Limiting alcohol use.  Practicing safe sex.  Taking low doses of aspirin every day.  Taking vitamin and mineral supplements as recommended by your health care provider. What happens during an annual well check? The services and screenings done by your health care provider during  your annual well check will depend on your age, overall health, lifestyle risk factors, and family history of disease. Counseling  Your health care provider may ask you questions about your:  Alcohol use.  Tobacco use.  Drug use.  Emotional well-being.  Home and relationship well-being.  Sexual activity.  Eating habits.  History of falls.  Memory and ability to understand (cognition).  Work and work Statistician. Screening  You may have the following tests or measurements:  Height, weight, and BMI.  Blood pressure.  Lipid and cholesterol levels. These may be checked every 5 years, or more frequently if you are over 37 years old.  Skin check.  Lung cancer screening. You may have this screening every year starting at age 40 if you have a 30-pack-year history of smoking and currently smoke or have quit within the past 15 years.  Fecal occult blood test (FOBT) of the stool. You may have this test every year starting at age 88.  Flexible sigmoidoscopy or colonoscopy. You may have a sigmoidoscopy every 5 years or a colonoscopy every 10 years starting at age 59.  Prostate cancer screening. Recommendations will vary depending on your family history and other risks.  Hepatitis C blood test.  Hepatitis B blood test.  Sexually transmitted disease (STD) testing.  Diabetes screening. This is done by checking your blood sugar (glucose) after you have not eaten for a while (fasting). You may have this done every 1-3 years.  Abdominal aortic aneurysm (AAA) screening. You may need this if you are a current or former smoker.  Osteoporosis. You may be screened starting at age 93 if you are at high risk. Talk with your health care provider about your  test results, treatment options, and if necessary, the need for more tests. Vaccines  Your health care provider may recommend certain vaccines, such as:  Influenza vaccine. This is recommended every year.  Tetanus, diphtheria, and  acellular pertussis (Tdap, Td) vaccine. You may need a Td booster every 10 years.  Zoster vaccine. You may need this after age 79.  Pneumococcal 13-valent conjugate (PCV13) vaccine. One dose is recommended after age 57.  Pneumococcal polysaccharide (PPSV23) vaccine. One dose is recommended after age 52. Talk to your health care provider about which screenings and vaccines you need and how often you need them. This information is not intended to replace advice given to you by your health care provider. Make sure you discuss any questions you have with your health care provider. Document Released: 11/19/2015 Document Revised: 07/12/2016 Document Reviewed: 08/24/2015 Elsevier Interactive Patient Education  2017 [place] Prevention in the Home Falls can cause injuries. They can happen to people of all ages. There are many things you can do to make your home safe and to help prevent falls. What can I do on the outside of my home?  Regularly fix the edges of walkways and driveways and fix any cracks.  Remove anything that might make you trip as you walk through a door, such as a raised step or threshold.  Trim any bushes or trees on the path to your home.  Use bright outdoor lighting.  Clear any walking paths of anything that might make someone trip, such as rocks or tools.  Regularly check to see if handrails are loose or broken. Make sure that both sides of any steps have handrails.  Any raised decks and porches should have guardrails on the edges.  Have any leaves, snow, or ice cleared regularly.  Use sand or salt on walking paths during winter.  Clean up any spills in your garage right away. This includes oil or grease spills. What can I do in the bathroom?  Use night lights.  Install grab bars by the toilet and in the tub and shower. Do not use towel bars as grab bars.  Use non-skid mats or decals in the tub or shower.  If you need to sit down in the shower, use  a plastic, non-slip stool.  Keep the floor dry. Clean up any water that spills on the floor as soon as it happens.  Remove soap buildup in the tub or shower regularly.  Attach bath mats securely with double-sided non-slip rug tape.  Do not have throw rugs and other things on the floor that can make you trip. What can I do in the bedroom?  Use night lights.  Make sure that you have a light by your bed that is easy to reach.  Do not use any sheets or blankets that are too big for your bed. They should not hang down onto the floor.  Have a firm chair that has side arms. You can use this for support while you get dressed.  Do not have throw rugs and other things on the floor that can make you trip. What can I do in the kitchen?  Clean up any spills right away.  Avoid walking on wet floors.  Keep items that you use a lot in easy-to-reach places.  If you need to reach something above you, use a strong step stool that has a grab bar.  Keep electrical cords out of the way.  Do not use floor polish or wax that  makes floors slippery. If you must use wax, use non-skid floor wax.  Do not have throw rugs and other things on the floor that can make you trip. What can I do with my stairs?  Do not leave any items on the stairs.  Make sure that there are handrails on both sides of the stairs and use them. Fix handrails that are broken or loose. Make sure that handrails are as long as the stairways.  Check any carpeting to make sure that it is firmly attached to the stairs. Fix any carpet that is loose or worn.  Avoid having throw rugs at the top or bottom of the stairs. If you do have throw rugs, attach them to the floor with carpet tape.  Make sure that you have a light switch at the top of the stairs and the bottom of the stairs. If you do not have them, ask someone to add them for you. What else can I do to help prevent falls?  Wear shoes that:  Do not have high heels.  Have  rubber bottoms.  Are comfortable and fit you well.  Are closed at the toe. Do not wear sandals.  If you use a stepladder:  Make sure that it is fully opened. Do not climb a closed stepladder.  Make sure that both sides of the stepladder are locked into place.  Ask someone to hold it for you, if possible.  Clearly mark and make sure that you can see:  Any grab bars or handrails.  First and last steps.  Where the edge of each step is.  Use tools that help you move around (mobility aids) if they are needed. These include:  Canes.  Walkers.  Scooters.  Crutches.  Turn on the lights when you go into a dark area. Replace any light bulbs as soon as they burn out.  Set up your furniture so you have a clear path. Avoid moving your furniture around.  If any of your floors are uneven, fix them.  If there are any pets around you, be aware of where they are.  Review your medicines with your doctor. Some medicines can make you feel dizzy. This can increase your chance of falling. Ask your doctor what other things that you can do to help prevent falls. This information is not intended to replace advice given to you by your health care provider. Make sure you discuss any questions you have with your health care provider. Document Released: 08/19/2009 Document Revised: 03/30/2016 Document Reviewed: 11/27/2014 Elsevier Interactive Patient Education  2017 [employer].

## 2021-01-02 ENCOUNTER — Other Ambulatory Visit: Payer: Self-pay | Admitting: Family Medicine

## 2021-01-02 DIAGNOSIS — I10 Essential (primary) hypertension: Secondary | ICD-10-CM

## 2021-01-14 ENCOUNTER — Telehealth (INDEPENDENT_AMBULATORY_CARE_PROVIDER_SITE_OTHER): Payer: Medicare HMO | Admitting: Physician Assistant

## 2021-01-14 DIAGNOSIS — J440 Chronic obstructive pulmonary disease with acute lower respiratory infection: Secondary | ICD-10-CM | POA: Diagnosis not present

## 2021-01-14 DIAGNOSIS — J209 Acute bronchitis, unspecified: Secondary | ICD-10-CM

## 2021-01-14 DIAGNOSIS — J441 Chronic obstructive pulmonary disease with (acute) exacerbation: Secondary | ICD-10-CM

## 2021-01-14 MED ORDER — PREDNISONE 20 MG PO TABS: 20.0000 mg | ORAL_TABLET | Freq: Every day | ORAL | 0 refills | Status: DC

## 2021-01-14 MED ORDER — ALBUTEROL SULFATE HFA 108 (90 BASE) MCG/ACT IN AERS: 2.0000 | INHALATION_SPRAY | Freq: Four times a day (QID) | RESPIRATORY_TRACT | 2 refills | Status: DC | PRN

## 2021-01-14 MED ORDER — DOXYCYCLINE HYCLATE 100 MG PO TABS
100.0000 mg | ORAL_TABLET | Freq: Two times a day (BID) | ORAL | 0 refills | Status: AC
Start: 2021-01-14 — End: 2021-01-21

## 2021-01-14 NOTE — Progress Notes (Signed)
MyChart Video Visit    Virtual Visit via Video Note   This visit type was conducted due to national recommendations for restrictions regarding the COVID-19 Pandemic (e.g. social distancing) in an effort to limit this patient's exposure and mitigate transmission in our community. This patient is at least at moderate risk for complications without adequate follow up. This format is felt to be most appropriate for this patient at this time. Physical exam was limited by quality of the video and audio technology used for the visit.   Patient location: Home Provider location: Office   I discussed the limitations of evaluation and management by telemedicine and the availability of in person appointments. The patient expressed understanding and agreed to proceed.  Patient: Joe Dillon   DOB: [birth year]   65 y.o. Male  MRN: [id number] Visit Date: 01/14/2021  Today's healthcare provider: Trinna Post, PA-C   Chief Complaint  Patient presents with  . COPD  . Cough   Subjective    URI  This is a new problem. The current episode started more than 1 month ago. The problem has been unchanged. There has been no fever. Associated symptoms include congestion and coughing. Pertinent negatives include no chest pain, ear pain or wheezing. Treatments tried: trelegy. The treatment provided mild relief.    Patient with history of COPD and currently on trelegy. Reports most recently he has had a cough productive of green sputum that has been persistent for one month. He reports shortness of breath and has been worsening. Reports he never got the albuterol inhaler.      Medications: Outpatient Medications Prior to Visit  Medication Sig  . Ascorbic Acid (VITAMIN C) 1000 MG tablet Take 1,000 mg by mouth daily.  . Fluticasone-Umeclidin-Vilant (TRELEGY ELLIPTA) 100-62.5-25 MCG/INH AEPB Take one puff daily  . lisinopril (ZESTRIL) 20 MG tablet TAKE ONE TABLET BY MOUTH DAILY  . meloxicam (MOBIC) 15  MG tablet Take 15 mg by mouth daily.  . Multiple Vitamin (MULTIVITAMIN) tablet Take 1 tablet by mouth daily.  [place] Kitchen esomeprazole (NEXIUM) 20 MG capsule Take 20 mg by mouth daily at 12 noon.  . [DISCONTINUED] acetaminophen (TYLENOL) 325 MG tablet Take 650 mg by mouth every 6 (six) hours as needed. Reported on 10/28/2015 (Patient not taking: Reported on 01/14/2021)  . [DISCONTINUED] albuterol (PROVENTIL HFA;VENTOLIN HFA) 108 (90 Base) MCG/ACT inhaler Inhale 2 puffs into the lungs every 6 (six) hours as needed for wheezing or shortness of breath. (Patient not taking: No sig reported)  . [DISCONTINUED] clotrimazole (MYCELEX) 10 MG troche TAKE ONE TABLET BY MOUTH FIVE TIMES DAILY (Patient not taking: No sig reported)  . [DISCONTINUED] predniSONE (DELTASONE) 10 MG tablet Take 6 pills on day 1, 5 pills on day 2 and so on until complete. (Patient not taking: No sig reported)   Facility-Administered Medications Prior to Visit  Medication Dose Route Frequency Provider  . betamethasone acetate-betamethasone sodium phosphate (CELESTONE) injection 3 mg  3 mg Intramuscular Once Daylene Katayama M, DPM  . betamethasone acetate-betamethasone sodium phosphate (CELESTONE) injection 3 mg  3 mg Intramuscular Once Edrick Kins, DPM    Review of Systems  HENT: Positive for congestion. Negative for ear pain.   Respiratory: Positive for cough and shortness of breath. Negative for apnea, chest tightness, wheezing and stridor.   Cardiovascular: Negative for chest pain.       Objective    There were no vitals taken for this visit.    Physical Exam Constitutional:  Appearance: Normal appearance.  Pulmonary:     Effort: Pulmonary effort is normal. No respiratory distress.  Neurological:     Mental Status: He is alert.  Psychiatric:        Mood and Affect: Mood normal.        Behavior: Behavior normal.        Assessment & Plan       Return if symptoms worsen or fail to improve.     I discussed the  assessment and treatment plan with the patient. The patient was provided an opportunity to ask questions and all were answered. The patient agreed with the plan and demonstrated an understanding of the instructions.   The patient was advised to call back or seek an in-person evaluation if the symptoms worsen or if the condition fails to improve as anticipated.   ITrinna Post, PA-C, have reviewed all documentation for this visit. The documentation on 01/14/21 for the exam, diagnosis, procedures, and orders are all accurate and complete.  The entirety of the information documented in the History of Present Illness, Review of Systems and Physical Exam were personally obtained by me. Portions of this information were initially documented by [facility] and reviewed by me for thoroughness and accuracy.    Paulene Floor [facility] [phone] (phone) [phone] (fax)  [place]

## 2021-01-14 NOTE — Patient Instructions (Signed)

## 2021-01-24 ENCOUNTER — Other Ambulatory Visit: Payer: Self-pay | Admitting: Family Medicine

## 2021-02-02 ENCOUNTER — Other Ambulatory Visit: Payer: Self-pay

## 2021-02-02 DIAGNOSIS — J449 Chronic obstructive pulmonary disease, unspecified: Secondary | ICD-10-CM

## 2021-02-02 MED ORDER — TRELEGY ELLIPTA 100-62.5-25 MCG/INH IN AEPB: INHALATION_SPRAY | RESPIRATORY_TRACT | 5 refills | Status: DC

## 2021-02-02 NOTE — Telephone Encounter (Signed)
[place] patient. Please review. Thanks!

## 2021-02-02 NOTE — Telephone Encounter (Signed)
[facility] faxed refill request for the following medications:  Fluticasone-Umeclidin-Vilant (TRELEGY ELLIPTA) 100-62.5-25 MCG/INH AEPB   Please advise.

## 2021-02-28 ENCOUNTER — Ambulatory Visit: Payer: Medicare HMO | Admitting: Family Medicine

## 2021-03-04 ENCOUNTER — Other Ambulatory Visit: Payer: Self-pay | Admitting: Family Medicine

## 2021-03-04 NOTE — Telephone Encounter (Signed)
Requested medication (s) are due for refill today:no  Requested medication (s) are on the active medication list: no   Future visit scheduled: no  Notes to clinic:  Medication not assigned to a protocol, review manually   Requested Prescriptions  Pending Prescriptions Disp Refills   clotrimazole (MYCELEX) 10 MG troche [Pharmacy Med Name: CLOTRIMAZOLE 10 MG TROCHE] 24 Troche     Sig: DISSOLVE ONE TABLET IN MOUTH FIVE TIMES DAILY      Off-Protocol Failed - 03/04/2021  7:11 AM      Failed - Medication not assigned to a protocol, review manually.      Passed - Valid encounter within last 12 months    Recent Outpatient Visits           1 month ago COPD exacerbation [facility])   [place], [place], PA-C   3 months ago COPD exacerbation [facility])   [facility] [place], [place], [place]   1 year ago Benign essential HTN   [place], Vickki Muff, PA-C   2 years ago Benign essential HTN   [place], Vickki Muff, PA-C   2 years ago Breathlessness on exertion   [place], Vickki Muff, [place]

## 2021-04-01 ENCOUNTER — Other Ambulatory Visit: Payer: Self-pay | Admitting: Family Medicine

## 2021-04-01 DIAGNOSIS — I10 Essential (primary) hypertension: Secondary | ICD-10-CM

## 2021-04-01 NOTE — Telephone Encounter (Signed)
Requested medications are due for refill today yes  Requested medications are on the active medication list yes  Last refill 2/28  Last visit 12/2019 that addressed HTN/med  Future visit scheduled 12/2021  Notes to clinic Canceled appt 02/28/21, please assess.

## 2021-06-01 DIAGNOSIS — M7022 Olecranon bursitis, left elbow: Secondary | ICD-10-CM | POA: Diagnosis not present

## 2021-06-08 DIAGNOSIS — D225 Melanocytic nevi of trunk: Secondary | ICD-10-CM | POA: Diagnosis not present

## 2021-06-08 DIAGNOSIS — X32XXXA Exposure to sunlight, initial encounter: Secondary | ICD-10-CM | POA: Diagnosis not present

## 2021-06-08 DIAGNOSIS — L570 Actinic keratosis: Secondary | ICD-10-CM | POA: Diagnosis not present

## 2021-06-08 DIAGNOSIS — Z1283 Encounter for screening for malignant neoplasm of skin: Secondary | ICD-10-CM | POA: Diagnosis not present

## 2021-06-08 DIAGNOSIS — M7022 Olecranon bursitis, left elbow: Secondary | ICD-10-CM | POA: Diagnosis not present

## 2021-06-26 ENCOUNTER — Other Ambulatory Visit: Payer: Self-pay | Admitting: Family Medicine

## 2021-06-26 DIAGNOSIS — I10 Essential (primary) hypertension: Secondary | ICD-10-CM

## 2021-06-27 NOTE — Telephone Encounter (Signed)
Requested medications are due for refill today yes   Requested medications are on the active medication list yes   Last refill 5/31   Last visit 12/2019 that addressed HTN/med   Future visit scheduled 12/2021   Notes to clinic Canceled appt 02/28/21, please assess. Requested Prescriptions  Pending Prescriptions Disp Refills   lisinopril (ZESTRIL) 20 MG tablet [Pharmacy Med Name: LISINOPRIL 20 MG TABLET] 90 tablet 0    Sig: TAKE ONE TABLET BY MOUTH DAILY.     Cardiovascular:  ACE Inhibitors Failed - 06/26/2021  7:33 PM      Failed - Cr in normal range and within 180 days    Creatinine, Ser  Date Value Ref Range Status  01/05/2020 0.84 0.76 - 1.27 mg/dL Final          Failed - K in normal range and within 180 days    Potassium  Date Value Ref Range Status  01/05/2020 4.5 3.5 - 5.2 mmol/L Final          Failed - Last BP in normal range    BP Readings from Last 1 Encounters:  01/01/20 (!) 178/106          Failed - Valid encounter within last 6 months    Recent Outpatient Visits           5 months ago COPD exacerbation [facility])   [place], [place], PA-C   7 months ago COPD exacerbation [facility])   [place], [place], [place]   1 year ago Benign essential HTN   [place], Vickki Muff, PA-C   2 years ago Benign essential HTN   [place], Vickki Muff, PA-C   3 years ago Breathlessness on exertion   [employer], Vickki Muff, [place]              Passed - Patient is not pregnant

## 2021-07-01 DIAGNOSIS — M7022 Olecranon bursitis, left elbow: Secondary | ICD-10-CM | POA: Diagnosis not present

## 2021-07-14 ENCOUNTER — Other Ambulatory Visit: Payer: Self-pay | Admitting: Family Medicine

## 2021-07-14 DIAGNOSIS — I10 Essential (primary) hypertension: Secondary | ICD-10-CM

## 2021-07-14 NOTE — Telephone Encounter (Signed)
Attempted to call patient to schedule 6 month f/u- left message to call office for appointment. 30 day courtesy RF given.

## 2021-08-02 ENCOUNTER — Telehealth: Payer: Self-pay

## 2021-08-02 DIAGNOSIS — J449 Chronic obstructive pulmonary disease, unspecified: Secondary | ICD-10-CM

## 2021-08-02 MED ORDER — TRELEGY ELLIPTA 100-62.5-25 MCG/INH IN AEPB: INHALATION_SPRAY | RESPIRATORY_TRACT | 0 refills | Status: DC

## 2021-08-02 NOTE — Telephone Encounter (Signed)
[facility] faxed refill request for the following medications:  Fluticasone-Umeclidin-Vilant (TRELEGY ELLIPTA) 100-62.5-25 MCG/INH AEPB   Please advise.

## 2021-08-29 ENCOUNTER — Telehealth: Payer: Self-pay | Admitting: Family Medicine

## 2021-08-29 DIAGNOSIS — I10 Essential (primary) hypertension: Secondary | ICD-10-CM

## 2021-08-29 DIAGNOSIS — J449 Chronic obstructive pulmonary disease, unspecified: Secondary | ICD-10-CM

## 2021-08-29 NOTE — Telephone Encounter (Signed)
Already given a curtesy refill, appt not scheduled until 12/28/21, please assess.  Requested Prescriptions  Pending Prescriptions Disp Refills  . TRELEGY ELLIPTA 100-62.5-25 MCG/ACT AEPB [Pharmacy Med Name: TRELEGY ELLIPTA 100-62.5-25] 60 each 0    Sig: INHALE ONE PUFF BY MOUTH DAILY     There is no refill protocol information for this order

## 2021-08-30 MED ORDER — TRELEGY ELLIPTA 100-62.5-25 MCG/ACT IN AEPB: 1.0000 | INHALATION_SPRAY | Freq: Every day | RESPIRATORY_TRACT | 0 refills | Status: DC

## 2021-08-30 MED ORDER — LISINOPRIL 20 MG PO TABS: 20.0000 mg | ORAL_TABLET | Freq: Every day | ORAL | 0 refills | Status: DC

## 2021-08-30 NOTE — Addendum Note (Signed)
Addended by: Shawna Orleans on: 08/30/2021 04:21 PM   Modules accepted: Orders

## 2021-08-30 NOTE — Telephone Encounter (Signed)
Pt called in for assistance. Pt has scheduled a medication follow up apt with Dr. Rosanna Randy. Pt is scheduled for a np apt with Jolene Cannady. Pt would like to know if provider Rosanna Randy could send in a refill for 2 medications until his apt in November? Pt says that he is completely out    Medications  Trelegy Ellipta   Lisinopril   Pt says requests has been denied by pharmacy due to a fu being needed.    Pharmacy:  [facility] [id number] - [address], [place] Phone:  [phone]  Fax:  [phone]

## 2021-09-02 DIAGNOSIS — J069 Acute upper respiratory infection, unspecified: Secondary | ICD-10-CM | POA: Diagnosis not present

## 2021-09-02 DIAGNOSIS — R059 Cough, unspecified: Secondary | ICD-10-CM | POA: Diagnosis not present

## 2021-09-02 DIAGNOSIS — Z20822 Contact with and (suspected) exposure to covid-19: Secondary | ICD-10-CM | POA: Diagnosis not present

## 2021-09-07 DIAGNOSIS — J209 Acute bronchitis, unspecified: Secondary | ICD-10-CM | POA: Diagnosis not present

## 2021-09-12 ENCOUNTER — Telehealth: Payer: Self-pay | Admitting: *Deleted

## 2021-09-12 DIAGNOSIS — I10 Essential (primary) hypertension: Secondary | ICD-10-CM | POA: Diagnosis not present

## 2021-09-12 DIAGNOSIS — R42 Dizziness and giddiness: Secondary | ICD-10-CM | POA: Diagnosis not present

## 2021-09-12 DIAGNOSIS — R0602 Shortness of breath: Secondary | ICD-10-CM | POA: Diagnosis not present

## 2021-09-12 DIAGNOSIS — F17210 Nicotine dependence, cigarettes, uncomplicated: Secondary | ICD-10-CM | POA: Diagnosis not present

## 2021-09-12 DIAGNOSIS — Z20822 Contact with and (suspected) exposure to covid-19: Secondary | ICD-10-CM | POA: Diagnosis not present

## 2021-09-12 DIAGNOSIS — R200 Anesthesia of skin: Secondary | ICD-10-CM | POA: Diagnosis not present

## 2021-09-12 DIAGNOSIS — R69 Illness, unspecified: Secondary | ICD-10-CM | POA: Diagnosis not present

## 2021-09-12 DIAGNOSIS — R0682 Tachypnea, not elsewhere classified: Secondary | ICD-10-CM | POA: Diagnosis not present

## 2021-09-12 DIAGNOSIS — J441 Chronic obstructive pulmonary disease with (acute) exacerbation: Secondary | ICD-10-CM | POA: Diagnosis not present

## 2021-09-12 DIAGNOSIS — Z79899 Other long term (current) drug therapy: Secondary | ICD-10-CM | POA: Diagnosis not present

## 2021-09-12 DIAGNOSIS — E785 Hyperlipidemia, unspecified: Secondary | ICD-10-CM | POA: Diagnosis not present

## 2021-09-12 NOTE — Telephone Encounter (Signed)
Copied from [place] [id number]. Topic: General - Other >> Sep 12, 2021  2:59 PM Wynetta Emery, [place] C wrote: Reason for CRM:pt's spouse called in for assistance. Pt was seen at the [facility] and was prescribed prednisone. Spouse says that pt still isn't feeling his best. Spouse says that the medication has caused pt's bp to be higher than normal. Last reading was 201/110. Spouse says that pt isn't experiencing any dizziness, light headedness or any other symptoms or concerns. Pt/ spouse is calling in because they would like to know if a provider could see him sooner than scheduled for this concern?   CB: [phone]

## 2021-09-14 ENCOUNTER — Ambulatory Visit: Payer: Medicare HMO | Admitting: Family Medicine

## 2021-09-14 DIAGNOSIS — J441 Chronic obstructive pulmonary disease with (acute) exacerbation: Secondary | ICD-10-CM | POA: Diagnosis not present

## 2021-09-20 ENCOUNTER — Encounter: Payer: Self-pay | Admitting: Family Medicine

## 2021-09-20 ENCOUNTER — Ambulatory Visit (INDEPENDENT_AMBULATORY_CARE_PROVIDER_SITE_OTHER): Payer: Medicare HMO | Admitting: Family Medicine

## 2021-09-20 ENCOUNTER — Other Ambulatory Visit: Payer: Self-pay

## 2021-09-20 VITALS — BP 182/103 | HR 75 | Temp 98.3°F | Ht 69.0 in | Wt 191.7 lb

## 2021-09-20 DIAGNOSIS — J441 Chronic obstructive pulmonary disease with (acute) exacerbation: Secondary | ICD-10-CM | POA: Diagnosis not present

## 2021-09-20 DIAGNOSIS — Z720 Tobacco use: Secondary | ICD-10-CM

## 2021-09-20 DIAGNOSIS — I10 Essential (primary) hypertension: Secondary | ICD-10-CM | POA: Diagnosis not present

## 2021-09-20 DIAGNOSIS — M47812 Spondylosis without myelopathy or radiculopathy, cervical region: Secondary | ICD-10-CM | POA: Diagnosis not present

## 2021-09-20 DIAGNOSIS — J449 Chronic obstructive pulmonary disease, unspecified: Secondary | ICD-10-CM

## 2021-09-20 DIAGNOSIS — M5030 Other cervical disc degeneration, unspecified cervical region: Secondary | ICD-10-CM | POA: Diagnosis not present

## 2021-09-20 MED ORDER — LISINOPRIL 40 MG PO TABS: 40.0000 mg | ORAL_TABLET | Freq: Every day | ORAL | 1 refills | Status: DC

## 2021-09-20 MED ORDER — AMLODIPINE BESYLATE 10 MG PO TABS: 10.0000 mg | ORAL_TABLET | Freq: Every day | ORAL | 1 refills | Status: DC

## 2021-09-20 MED ORDER — LISINOPRIL 20 MG PO TABS: 40.0000 mg | ORAL_TABLET | Freq: Every day | ORAL | 5 refills | Status: DC

## 2021-09-20 NOTE — Progress Notes (Signed)
Established patient visit   Patient: Joe Dillon   DOB: 1956/05/27   65 y.o. Male  MRN: [id number] Visit Date: 09/20/2021  Today's healthcare provider: Wilhemena Durie, MD   Chief Complaint  Patient presents with   Hospitalization Follow-up   Subjective    HPI  Patient was seen in treated in the ED due to COPD exacerbation and for elevated blood pressure. Pt continues to smoke 1 ppd.  She is feeling better. Unfortunately did have a son died age 9 or earlier this year in June. Patient does complain of chronic neck stiffness and known trauma.  Follow up ER visit  Patient was seen in ER for COPD with acute exacerbation on 09/12/2021. He was treated for COPD. Treatment for this included; see notes in chart. He reports good compliance with treatment. He reports this condition is improved.  ----------------------------------------------------------------------------------------- Patient has improved since ER visit. However, he has been having numbness in his right thumb and right arm for 2-3 weeks.    Medications: Outpatient Medications Prior to Visit  Medication Sig   albuterol (VENTOLIN HFA) 108 (90 Base) MCG/ACT inhaler Inhale 2 puffs into the lungs every 6 (six) hours as needed for wheezing or shortness of breath.   Ascorbic Acid (VITAMIN C) 1000 MG tablet Take 1,000 mg by mouth daily.   clotrimazole (MYCELEX) 10 MG troche DISSOLVE ONE TABLET IN MOUTH FIVE TIMES DAILY   esomeprazole (NEXIUM) 20 MG capsule Take 20 mg by mouth daily at 12 noon.   Fluticasone-Umeclidin-Vilant (TRELEGY ELLIPTA) 100-62.5-25 MCG/ACT AEPB Inhale 1 puff into the lungs daily.   Fluticasone-Umeclidin-Vilant (TRELEGY ELLIPTA) 100-62.5-25 MCG/INH AEPB Take one puff daily Please schedule an office visit before anymore refills.   lisinopril (ZESTRIL) 20 MG tablet Take 1 tablet (20 mg total) by mouth daily.   meloxicam (MOBIC) 15 MG tablet Take 15 mg by mouth daily.   Multiple Vitamin  (MULTIVITAMIN) tablet Take 1 tablet by mouth daily.   predniSONE (DELTASONE) 20 MG tablet Take 1 tablet (20 mg total) by mouth daily with breakfast.   Facility-Administered Medications Prior to Visit  Medication Dose Route Frequency Provider   betamethasone acetate-betamethasone sodium phosphate (CELESTONE) injection 3 mg  3 mg Intramuscular Once Daylene Katayama M, DPM   betamethasone acetate-betamethasone sodium phosphate (CELESTONE) injection 3 mg  3 mg Intramuscular Once Edrick Kins, DPM    Review of Systems  Constitutional:  Negative for appetite change, chills and fever.  Respiratory:  Negative for chest tightness, shortness of breath and wheezing.   Cardiovascular:  Negative for chest pain and palpitations.  Gastrointestinal:  Negative for abdominal pain, nausea and vomiting.       Objective    BP (!) 182/103 (BP Location: Right Arm, Patient Position: Sitting, Cuff Size: Normal)   Pulse 75   Temp 98.3 F (36.8 C)   Ht 5\' 9"  (1.753 m)   Wt 191 lb 11.2 oz (87 kg)   SpO2 97%   BMI 28.31 kg/m  BP Readings from Last 3 Encounters:  09/20/21 (!) 182/103  01/01/20 (!) 178/106  11/29/18 (!) 148/80   Wt Readings from Last 3 Encounters:  09/20/21 191 lb 11.2 oz (87 kg)  01/01/20 185 lb 12.8 oz (84.3 kg)  11/29/18 184 lb 12.8 oz (83.8 kg)      Physical Exam Vitals reviewed.  Constitutional:      General: He is not in acute distress.    Appearance: He is well-developed.  HENT:  Head: Normocephalic and atraumatic.     Right Ear: Hearing normal.     Left Ear: Hearing normal.     Nose: Nose normal.  Eyes:     General: Lids are normal. No scleral icterus.       Right eye: No discharge.        Left eye: No discharge.     Conjunctiva/sclera: Conjunctivae normal.  Cardiovascular:     Rate and Rhythm: Normal rate and regular rhythm.     Heart sounds: Normal heart sounds.  Pulmonary:     Effort: Pulmonary effort is normal. No respiratory distress.     Breath sounds:  Normal breath sounds.  Skin:    General: Skin is warm and dry.     Findings: No lesion or rash.  Neurological:     General: No focal deficit present.     Mental Status: He is alert and oriented to person, place, and time.  Psychiatric:        Mood and Affect: Mood normal.        Speech: Speech normal.        Behavior: Behavior normal.        Thought Content: Thought content normal.        Judgment: Judgment normal.      No results found for any visits on 09/20/21.  Assessment & Plan     1. Benign essential HTN Tolerated hypertension.  At this time increase lisinopril from to 40 mg and amlodipine increased from 5 to 10 mg daily.  Follow-up in a month or 2 - lisinopril (ZESTRIL) 40 MG tablet; Take 1 tablet (40 mg total) by mouth daily.  Dispense: 90 tablet; Refill: 1  2. Cervical arthritis Is likely arthritis and DDD.  Pain x-ray of the neck. Topicals such as capsaicin and Voltaren gel. - DG Cervical Spine Complete  3. DDD (degenerative disc disease), cervical  - DG Cervical Spine Complete  4. Chronic obstructive pulmonary disease, unspecified COPD type ([place]) Improved clinically.  Finish treatment.  5. COPD exacerbation ([place])   6. Current tobacco use Patient strongly advised to stop smoking.   No follow-ups on file.      I, Wilhemena Durie, MD, have reviewed all documentation for this visit. The documentation on 09/24/21 for the exam, diagnosis, procedures, and orders are all accurate and complete.    Kazimir Hartnett Cranford Mon, MD  [facility] [phone] (phone) [phone] (fax)  [place]

## 2021-09-22 NOTE — Telephone Encounter (Signed)
Patient seen in office

## 2021-09-23 ENCOUNTER — Telehealth: Payer: Self-pay

## 2021-09-23 NOTE — Telephone Encounter (Signed)
Copied from [place] [id number]. Topic: Referral - Request for Referral >> Sep 23, 2021 10:23 AM Alanda Slim E wrote: Has patient seen PCP for this complaint? Yes  *If NO, is insurance requiring patient see PCP for this issue before PCP can refer them? Referral for which specialty: Neurology  Preferred provider/office: [facility] in [place]  Reason for referral: Tingling in right arm and numbness in right thumb with weakness in the arm / pt would like referral asap  (Pt week to chiropractor and had adjustment done and she pin pointed an area)

## 2021-09-26 ENCOUNTER — Other Ambulatory Visit: Payer: Self-pay | Admitting: *Deleted

## 2021-09-26 ENCOUNTER — Other Ambulatory Visit: Payer: Self-pay | Admitting: Family Medicine

## 2021-09-26 DIAGNOSIS — M47812 Spondylosis without myelopathy or radiculopathy, cervical region: Secondary | ICD-10-CM

## 2021-09-26 DIAGNOSIS — M5030 Other cervical disc degeneration, unspecified cervical region: Secondary | ICD-10-CM

## 2021-09-26 DIAGNOSIS — I10 Essential (primary) hypertension: Secondary | ICD-10-CM

## 2021-09-26 NOTE — Telephone Encounter (Signed)
Please advise referral?  

## 2021-09-26 NOTE — Telephone Encounter (Signed)
Referral ordered

## 2021-10-07 DIAGNOSIS — M5412 Radiculopathy, cervical region: Secondary | ICD-10-CM | POA: Diagnosis not present

## 2021-10-28 DIAGNOSIS — M542 Cervicalgia: Secondary | ICD-10-CM | POA: Diagnosis not present

## 2021-11-01 ENCOUNTER — Other Ambulatory Visit: Payer: Self-pay

## 2021-11-01 ENCOUNTER — Telehealth: Payer: Self-pay | Admitting: Family Medicine

## 2021-11-01 DIAGNOSIS — J440 Chronic obstructive pulmonary disease with acute lower respiratory infection: Secondary | ICD-10-CM

## 2021-11-01 DIAGNOSIS — M542 Cervicalgia: Secondary | ICD-10-CM | POA: Diagnosis not present

## 2021-11-01 MED ORDER — ALBUTEROL SULFATE HFA 108 (90 BASE) MCG/ACT IN AERS: 2.0000 | INHALATION_SPRAY | Freq: Four times a day (QID) | RESPIRATORY_TRACT | 2 refills | Status: DC | PRN

## 2021-11-01 NOTE — Telephone Encounter (Signed)
[facility] faxed refill request for the following medications:  albuterol (VENTOLIN HFA) 108 (90 Base) MCG/ACT inhaler   Please advise.

## 2021-11-06 DIAGNOSIS — J431 Panlobular emphysema: Secondary | ICD-10-CM

## 2021-11-06 HISTORY — DX: Panlobular emphysema: J43.1

## 2021-11-10 DIAGNOSIS — M7918 Myalgia, other site: Secondary | ICD-10-CM | POA: Diagnosis not present

## 2021-11-10 DIAGNOSIS — M50323 Other cervical disc degeneration at C6-C7 level: Secondary | ICD-10-CM | POA: Diagnosis not present

## 2021-11-10 DIAGNOSIS — M5413 Radiculopathy, cervicothoracic region: Secondary | ICD-10-CM | POA: Diagnosis not present

## 2021-11-10 DIAGNOSIS — M50322 Other cervical disc degeneration at C5-C6 level: Secondary | ICD-10-CM | POA: Diagnosis not present

## 2021-11-10 DIAGNOSIS — M9901 Segmental and somatic dysfunction of cervical region: Secondary | ICD-10-CM | POA: Diagnosis not present

## 2021-11-10 DIAGNOSIS — R202 Paresthesia of skin: Secondary | ICD-10-CM | POA: Diagnosis not present

## 2021-11-10 DIAGNOSIS — M542 Cervicalgia: Secondary | ICD-10-CM | POA: Diagnosis not present

## 2021-11-11 ENCOUNTER — Ambulatory Visit: Payer: Medicare HMO | Admitting: Nurse Practitioner

## 2021-11-14 DIAGNOSIS — M5412 Radiculopathy, cervical region: Secondary | ICD-10-CM | POA: Diagnosis not present

## 2021-11-16 DIAGNOSIS — R202 Paresthesia of skin: Secondary | ICD-10-CM | POA: Diagnosis not present

## 2021-11-16 DIAGNOSIS — M5413 Radiculopathy, cervicothoracic region: Secondary | ICD-10-CM | POA: Diagnosis not present

## 2021-11-16 DIAGNOSIS — M9901 Segmental and somatic dysfunction of cervical region: Secondary | ICD-10-CM | POA: Diagnosis not present

## 2021-11-16 DIAGNOSIS — M50323 Other cervical disc degeneration at C6-C7 level: Secondary | ICD-10-CM | POA: Diagnosis not present

## 2021-11-16 DIAGNOSIS — M7918 Myalgia, other site: Secondary | ICD-10-CM | POA: Diagnosis not present

## 2021-11-16 DIAGNOSIS — M50322 Other cervical disc degeneration at C5-C6 level: Secondary | ICD-10-CM | POA: Diagnosis not present

## 2021-11-16 DIAGNOSIS — M542 Cervicalgia: Secondary | ICD-10-CM | POA: Diagnosis not present

## 2021-11-21 DIAGNOSIS — Z0000 Encounter for general adult medical examination without abnormal findings: Secondary | ICD-10-CM | POA: Diagnosis not present

## 2021-11-21 DIAGNOSIS — Z79899 Other long term (current) drug therapy: Secondary | ICD-10-CM | POA: Diagnosis not present

## 2021-11-21 DIAGNOSIS — J431 Panlobular emphysema: Secondary | ICD-10-CM | POA: Insufficient documentation

## 2021-11-21 DIAGNOSIS — Z125 Encounter for screening for malignant neoplasm of prostate: Secondary | ICD-10-CM | POA: Diagnosis not present

## 2021-11-21 DIAGNOSIS — M5030 Other cervical disc degeneration, unspecified cervical region: Secondary | ICD-10-CM | POA: Insufficient documentation

## 2021-11-21 DIAGNOSIS — Z720 Tobacco use: Secondary | ICD-10-CM | POA: Diagnosis not present

## 2021-11-21 DIAGNOSIS — I10 Essential (primary) hypertension: Secondary | ICD-10-CM | POA: Diagnosis not present

## 2021-11-21 DIAGNOSIS — E782 Mixed hyperlipidemia: Secondary | ICD-10-CM | POA: Diagnosis not present

## 2021-11-23 DIAGNOSIS — M5412 Radiculopathy, cervical region: Secondary | ICD-10-CM | POA: Insufficient documentation

## 2021-11-23 DIAGNOSIS — M542 Cervicalgia: Secondary | ICD-10-CM | POA: Insufficient documentation

## 2021-11-29 ENCOUNTER — Other Ambulatory Visit: Payer: Self-pay | Admitting: Family Medicine

## 2021-11-29 NOTE — Telephone Encounter (Signed)
Requested medication (s) are due for refill today - yes  Requested medication (s) are on the active medication list -yes  Future visit scheduled -no  Last refill: 08/02/21  Notes to clinic: Request RF: medication has no protocol- sent for review   Requested Prescriptions  Pending Prescriptions Disp [place] 100-62.5-25 MCG/ACT AEPB [Pharmacy Med Name: TRELEGY ELLIPTA 100-62.5-25] 60 each 1    Sig: INHALE ONE PUFF BY MOUTH DAILY     There is no refill protocol information for this order       Requested Prescriptions  Pending Prescriptions Disp [place] 100-62.5-25 MCG/ACT AEPB [Pharmacy Med Name: TRELEGY ELLIPTA 100-62.5-25] 60 each 1    Sig: INHALE ONE PUFF BY MOUTH DAILY     There is no refill protocol information for this order

## 2021-12-02 DIAGNOSIS — M542 Cervicalgia: Secondary | ICD-10-CM | POA: Diagnosis not present

## 2021-12-02 DIAGNOSIS — M5412 Radiculopathy, cervical region: Secondary | ICD-10-CM | POA: Diagnosis not present

## 2021-12-09 DIAGNOSIS — M5412 Radiculopathy, cervical region: Secondary | ICD-10-CM | POA: Diagnosis not present

## 2021-12-16 DIAGNOSIS — E782 Mixed hyperlipidemia: Secondary | ICD-10-CM | POA: Diagnosis not present

## 2021-12-16 DIAGNOSIS — Z79899 Other long term (current) drug therapy: Secondary | ICD-10-CM | POA: Diagnosis not present

## 2021-12-16 DIAGNOSIS — Z125 Encounter for screening for malignant neoplasm of prostate: Secondary | ICD-10-CM | POA: Diagnosis not present

## 2021-12-16 DIAGNOSIS — I10 Essential (primary) hypertension: Secondary | ICD-10-CM | POA: Diagnosis not present

## 2021-12-23 DIAGNOSIS — J449 Chronic obstructive pulmonary disease, unspecified: Secondary | ICD-10-CM | POA: Diagnosis not present

## 2021-12-23 DIAGNOSIS — I10 Essential (primary) hypertension: Secondary | ICD-10-CM | POA: Diagnosis not present

## 2021-12-23 DIAGNOSIS — Z0000 Encounter for general adult medical examination without abnormal findings: Secondary | ICD-10-CM | POA: Diagnosis not present

## 2021-12-23 DIAGNOSIS — E785 Hyperlipidemia, unspecified: Secondary | ICD-10-CM | POA: Diagnosis not present

## 2021-12-23 DIAGNOSIS — Z720 Tobacco use: Secondary | ICD-10-CM | POA: Diagnosis not present

## 2021-12-28 ENCOUNTER — Ambulatory Visit: Payer: Medicare HMO

## 2022-01-02 DIAGNOSIS — M5412 Radiculopathy, cervical region: Secondary | ICD-10-CM | POA: Diagnosis not present

## 2022-01-30 ENCOUNTER — Telehealth: Payer: Self-pay | Admitting: Family Medicine

## 2022-01-30 ENCOUNTER — Other Ambulatory Visit: Payer: Self-pay

## 2022-01-30 NOTE — Telephone Encounter (Signed)
Patient has established with Dr Doy Hutching ?

## 2022-01-30 NOTE — Telephone Encounter (Signed)
[facility] faxed refill request for the following medications: ? ?TRELEGY ELLIPTA 100-62.5-25 MCG/ACT AEPB  ? ?Please advise. ? ?

## 2022-01-31 ENCOUNTER — Telehealth: Payer: Self-pay | Admitting: Family Medicine

## 2022-01-31 NOTE — Telephone Encounter (Signed)
Patient has established care with Dr Doy Hutching.  Spoke to wife and she will notify pharmacy to send requests to him ?

## 2022-01-31 NOTE — Telephone Encounter (Signed)
[facility] faxed refill request for the following medications: ? ?TRELEGY ELLIPTA 100-62.5-25 MCG/ACT AEPB ? ? ?Please advise ? ?

## 2022-03-24 ENCOUNTER — Other Ambulatory Visit: Payer: Self-pay | Admitting: Family Medicine

## 2022-03-24 DIAGNOSIS — I10 Essential (primary) hypertension: Secondary | ICD-10-CM

## 2022-04-17 DIAGNOSIS — M170 Bilateral primary osteoarthritis of knee: Secondary | ICD-10-CM | POA: Diagnosis not present

## 2022-06-23 DIAGNOSIS — I10 Essential (primary) hypertension: Secondary | ICD-10-CM | POA: Diagnosis not present

## 2022-06-23 DIAGNOSIS — Z0000 Encounter for general adult medical examination without abnormal findings: Secondary | ICD-10-CM | POA: Diagnosis not present

## 2022-06-23 DIAGNOSIS — J431 Panlobular emphysema: Secondary | ICD-10-CM | POA: Diagnosis not present

## 2022-06-23 DIAGNOSIS — Z79899 Other long term (current) drug therapy: Secondary | ICD-10-CM | POA: Diagnosis not present

## 2022-06-23 DIAGNOSIS — E782 Mixed hyperlipidemia: Secondary | ICD-10-CM | POA: Diagnosis not present

## 2022-08-03 ENCOUNTER — Encounter: Payer: Self-pay | Admitting: Gastroenterology

## 2022-08-03 NOTE — H&P (Signed)
Pre-Procedure H&P   Patient ID: Joe Dillon is a 66 y.o. male.  Gastroenterology Provider: Annamaria Helling, DO  Referring Provider: Dr. Doy Hutching PCP: Dr. Doy Hutching  Date: 08/04/2022  HPI [name] is a 66 y.o. male who presents today for Colonoscopy for Surveillance; personal history of colon polyps.  Personal history of colon polyps.  Last underwent colonoscopy in February 2016 with Dr. Donnal Moat demonstrated 10 mm tubular adenomatous polyp.  Left-sided diverticulosis and external hemorrhoids were also demonstrated.  He has also had multiple hyperplastic polyps. No family history colon cancer or colon polyps. Most recent lab work hemoglobin 14.2 MCV 92 platelets 295,000 creatinine 0.8 Regular tobacco use. Taking meloxicam.   Past Medical History:  Diagnosis Date   Chronic cough    Hemorrhoids, thrombosed 05/16/2010   Hypertension    ST segment depression    Tendinitis of elbow     Past Surgical History:  Procedure Laterality Date   HERNIA REPAIR     INGUINAL HERNIA REPAIR Right 11/06/1998   Right Inguilal Herniorrhaphy    INGUINAL HERNIA REPAIR Left 11/07/1999   Left Inguinal Herniorrhaphy    Family History No h/o GI disease or malignancy  Review of Systems  Constitutional:  Negative for activity change, appetite change, chills, diaphoresis, fatigue, fever and unexpected weight change.  HENT:  Negative for trouble swallowing and voice change.   Respiratory:  Negative for shortness of breath and wheezing.   Cardiovascular:  Negative for chest pain, palpitations and leg swelling.  Gastrointestinal:  Negative for abdominal distention, abdominal pain, anal bleeding, blood in stool, constipation, diarrhea, nausea and vomiting.  Musculoskeletal:  Negative for arthralgias and myalgias.  Skin:  Negative for color change and pallor.  Neurological:  Negative for dizziness, syncope and weakness.  Psychiatric/Behavioral:  Negative for confusion. The patient is  not nervous/anxious.   All other systems reviewed and are negative.    Medications No current facility-administered medications on file prior to encounter.   Current Outpatient Medications on File Prior to Encounter  Medication Sig Dispense Refill   albuterol (VENTOLIN HFA) 108 (90 Base) MCG/ACT inhaler Inhale 2 puffs into the lungs every 6 (six) hours as needed for wheezing or shortness of breath. 1 each 2   amLODipine (NORVASC) 10 MG tablet Take 1 tablet (10 mg total) by mouth daily. 90 tablet 1   Ascorbic Acid (VITAMIN C) 1000 MG tablet Take 1,000 mg by mouth daily.     esomeprazole (NEXIUM) 20 MG capsule Take 20 mg by mouth daily at 12 noon.     Fluticasone-Umeclidin-Vilant (TRELEGY ELLIPTA) 100-62.5-25 MCG/INH AEPB Take one puff daily Please schedule an office visit before anymore refills. 60 each 0   meloxicam (MOBIC) 15 MG tablet Take 15 mg by mouth daily.     Multiple Vitamin (MULTIVITAMIN) tablet Take 1 tablet by mouth daily.     TRELEGY ELLIPTA 100-62.5-25 MCG/ACT AEPB INHALE ONE PUFF BY MOUTH DAILY 60 each 1   clotrimazole (MYCELEX) 10 MG troche DISSOLVE ONE TABLET IN MOUTH FIVE TIMES DAILY (Patient not taking: Reported on 08/04/2022) 24 Troche 0   lisinopril (ZESTRIL) 40 MG tablet Take 1 tablet (40 mg total) by mouth daily. (Patient not taking: Reported on 08/04/2022) 90 tablet 1   predniSONE (DELTASONE) 20 MG tablet Take 1 tablet (20 mg total) by mouth daily with breakfast. (Patient not taking: Reported on 08/04/2022) 5 tablet 0    Pertinent medications related to GI and procedure were reviewed by me with the patient  prior to the procedure   Current Facility-Administered Medications:    0.9 %  sodium chloride infusion, , Intravenous, Continuous, Annamaria Helling, DO, Last Rate: 20 mL/hr at 08/04/22 0721, New Bag at 08/04/22 0721  sodium chloride 20 mL/hr at 08/04/22 2353       No Known Allergies Allergies were reviewed by me prior to the procedure  Objective   Body  mass index is 26.98 kg/m. Vitals:   08/04/22 0707  BP: (!) 150/98  Pulse: 80  Resp: 18  Temp: (!) 97.2 F (36.2 C)  TempSrc: Temporal  SpO2: 95%  Weight: 85.3 kg  Height: '5\' 10"'$  (1.778 m)    Physical Exam Vitals and nursing note reviewed.  Constitutional:      General: He is not in acute distress.    Appearance: Normal appearance. He is not ill-appearing, toxic-appearing or diaphoretic.  HENT:     Head: Normocephalic and atraumatic.     Nose: Nose normal.     Mouth/Throat:     Mouth: Mucous membranes are moist.     Pharynx: Oropharynx is clear.  Eyes:     General: No scleral icterus.    Extraocular Movements: Extraocular movements intact.  Cardiovascular:     Rate and Rhythm: Normal rate and regular rhythm.     Heart sounds: Normal heart sounds. No murmur heard.    No friction rub. No gallop.  Pulmonary:     Effort: Pulmonary effort is normal. No respiratory distress.     Breath sounds: Normal breath sounds. No wheezing, rhonchi or rales.  Abdominal:     General: Bowel sounds are normal. There is no distension.     Palpations: Abdomen is soft.     Tenderness: There is no abdominal tenderness. There is no guarding or rebound.  Musculoskeletal:     Cervical back: Neck supple.     Right lower leg: No edema.     Left lower leg: No edema.  Skin:    General: Skin is warm and dry.     Coloration: Skin is not jaundiced or pale.  Neurological:     General: No focal deficit present.     Mental Status: He is alert and oriented to person, place, and time. Mental status is at baseline.  Psychiatric:        Mood and Affect: Mood normal.        Behavior: Behavior normal.        Thought Content: Thought content normal.        Judgment: Judgment normal.      Assessment:  [name] is a 66 y.o. male  who presents today for Colonoscopy for Surveillance; personal history of colon polyps.  Plan:  Colonoscopy with possible intervention today  Colonoscopy with  possible biopsy, control of bleeding, polypectomy, and interventions as necessary has been discussed with the patient/patient representative. Informed consent was obtained from the patient/patient representative after explaining the indication, nature, and risks of the procedure including but not limited to death, bleeding, perforation, missed neoplasm/lesions, cardiorespiratory compromise, and reaction to medications. Opportunity for questions was given and appropriate answers were provided. Patient/patient representative has verbalized understanding is amenable to undergoing the procedure.   Annamaria Helling, DO  [facility]  Portions of the record may have been created with voice recognition software. Occasional wrong-word or 'sound-a-like' substitutions may have occurred due to the inherent limitations of voice recognition software.  Read the chart carefully and recognize, using context, where substitutions may  have occurred.

## 2022-08-04 ENCOUNTER — Encounter
Admission: RE | Disposition: A | Discharge status: Home / Self Care | Payer: Self-pay | Source: Home / Self Care | Attending: Gastroenterology

## 2022-08-04 ENCOUNTER — Ambulatory Visit: Payer: Medicare HMO | Admitting: General Practice

## 2022-08-04 ENCOUNTER — Ambulatory Visit
Admission: RE | Admit: 2022-08-04 | Discharge: 2022-08-04 | Disposition: A | Discharge status: Home / Self Care | Payer: Medicare HMO | Attending: Gastroenterology | Admitting: Gastroenterology

## 2022-08-04 ENCOUNTER — Encounter: Payer: Self-pay | Admitting: Gastroenterology

## 2022-08-04 ENCOUNTER — Other Ambulatory Visit: Payer: Self-pay

## 2022-08-04 DIAGNOSIS — K641 Second degree hemorrhoids: Secondary | ICD-10-CM | POA: Diagnosis not present

## 2022-08-04 DIAGNOSIS — K635 Polyp of colon: Secondary | ICD-10-CM | POA: Diagnosis not present

## 2022-08-04 DIAGNOSIS — J449 Chronic obstructive pulmonary disease, unspecified: Secondary | ICD-10-CM | POA: Diagnosis not present

## 2022-08-04 DIAGNOSIS — K5730 Diverticulosis of large intestine without perforation or abscess without bleeding: Secondary | ICD-10-CM | POA: Diagnosis not present

## 2022-08-04 DIAGNOSIS — K649 Unspecified hemorrhoids: Secondary | ICD-10-CM | POA: Diagnosis not present

## 2022-08-04 DIAGNOSIS — Z1211 Encounter for screening for malignant neoplasm of colon: Secondary | ICD-10-CM | POA: Diagnosis not present

## 2022-08-04 DIAGNOSIS — K644 Residual hemorrhoidal skin tags: Secondary | ICD-10-CM | POA: Insufficient documentation

## 2022-08-04 DIAGNOSIS — D123 Benign neoplasm of transverse colon: Secondary | ICD-10-CM | POA: Diagnosis not present

## 2022-08-04 DIAGNOSIS — Z86010 Personal history of colonic polyps: Secondary | ICD-10-CM | POA: Insufficient documentation

## 2022-08-04 DIAGNOSIS — R69 Illness, unspecified: Secondary | ICD-10-CM | POA: Diagnosis not present

## 2022-08-04 DIAGNOSIS — K529 Noninfective gastroenteritis and colitis, unspecified: Secondary | ICD-10-CM | POA: Diagnosis not present

## 2022-08-04 DIAGNOSIS — D126 Benign neoplasm of colon, unspecified: Secondary | ICD-10-CM | POA: Diagnosis not present

## 2022-08-04 HISTORY — PX: COLONOSCOPY: SHX5424

## 2022-08-04 SURGERY — COLONOSCOPY
Anesthesia: General

## 2022-08-04 MED ORDER — SODIUM CHLORIDE 0.9 % IV SOLN: INTRAVENOUS | Status: DC

## 2022-08-04 MED ORDER — PROPOFOL 500 MG/50ML IV EMUL
INTRAVENOUS | Status: DC | PRN
  Administered 2022-08-04: 150 ug/kg/min via INTRAVENOUS

## 2022-08-04 MED ORDER — PROPOFOL 1000 MG/100ML IV EMUL
INTRAVENOUS | Status: AC
  Filled 2022-08-04: qty 100

## 2022-08-04 MED ORDER — MIDAZOLAM HCL 2 MG/2ML IJ SOLN
INTRAMUSCULAR | Status: AC
  Filled 2022-08-04: qty 2

## 2022-08-04 MED ORDER — LIDOCAINE HCL (CARDIAC) PF 100 MG/5ML IV SOSY
PREFILLED_SYRINGE | INTRAVENOUS | Status: DC | PRN
  Administered 2022-08-04: 50 mg via INTRAVENOUS

## 2022-08-04 MED ORDER — PROPOFOL 10 MG/ML IV BOLUS
INTRAVENOUS | Status: DC | PRN
  Administered 2022-08-04: 40 mg via INTRAVENOUS

## 2022-08-04 MED ORDER — KETAMINE HCL 10 MG/ML IJ SOLN
INTRAMUSCULAR | Status: DC | PRN
  Administered 2022-08-04: 20 mg via INTRAVENOUS

## 2022-08-04 MED ORDER — MIDAZOLAM HCL 2 MG/2ML IJ SOLN
INTRAMUSCULAR | Status: DC | PRN
  Administered 2022-08-04: 2 mg via INTRAVENOUS

## 2022-08-04 MED ORDER — KETAMINE HCL 50 MG/5ML IJ SOSY
PREFILLED_SYRINGE | INTRAMUSCULAR | Status: AC
  Filled 2022-08-04: qty 5

## 2022-08-04 MED ORDER — SODIUM CHLORIDE 0.9 % IV SOLN: INTRAVENOUS | Status: DC | PRN

## 2022-08-04 NOTE — Interval H&P Note (Signed)
History and Physical Interval Note: Preprocedure H&P from 08/04/22  was reviewed and there was no interval change after seeing and examining the patient.  Written consent was obtained from the patient after discussion of risks, benefits, and alternatives. Patient has consented to proceed with Colonoscopy with possible intervention   08/04/2022 7:28 AM  Kalil D Colgan  has presented today for surgery, with the diagnosis of History of adenomatous polyp of colon (Z86.010).  The various methods of treatment have been discussed with the patient and family. After consideration of risks, benefits and other options for treatment, the patient has consented to  Procedure(s): COLONOSCOPY (N/A) as a surgical intervention.  The patient's history has been reviewed, patient examined, no change in status, stable for surgery.  I have reviewed the patient's chart and labs.  Questions were answered to the patient's satisfaction.     Annamaria Helling

## 2022-08-04 NOTE — Transfer of Care (Addendum)
Immediate Anesthesia Transfer of Care Note  Patient: Joe Dillon  Procedure(s) Performed: COLONOSCOPY  Patient Location: PACU and Endoscopy Unit  Anesthesia Type:MAC  Level of Consciousness: drowsy  Airway & Oxygen Therapy: Patient Spontanous Breathing  Post-op Assessment: Report given to RN and Post -op Vital signs reviewed and stable  Post vital signs: Reviewed and stable  Last Vitals:  Vitals Value Taken Time  BP    Temp    Pulse    Resp    SpO2      Last Pain:  Vitals:   08/04/22 0707  TempSrc: Temporal  PainSc: 0-No pain         Complications: No notable events documented.

## 2022-08-04 NOTE — Anesthesia Postprocedure Evaluation (Signed)
Anesthesia Post Note  Patient: Joe Dillon  Procedure(s) Performed: COLONOSCOPY  Patient location during evaluation: Endoscopy Anesthesia Type: General Level of consciousness: awake and alert Pain management: pain level controlled Vital Signs Assessment: post-procedure vital signs reviewed and stable Respiratory status: spontaneous breathing, nonlabored ventilation, respiratory function stable and patient connected to nasal cannula oxygen Cardiovascular status: blood pressure returned to baseline and stable Postop Assessment: no apparent nausea or vomiting Anesthetic complications: no   No notable events documented.   Last Vitals:  Vitals:   08/04/22 0826 08/04/22 0836  BP: (!) 149/97 (!) 173/93  Pulse:    Resp:    Temp:    SpO2:      Last Pain:  Vitals:   08/04/22 0836  TempSrc:   PainSc: 0-No pain                 Dimas Millin

## 2022-08-04 NOTE — Op Note (Signed)
[facility]astroenterology Patient Name: Joe Dillon Procedure Date: 08/04/2022 7:33 AM MRN: [id number] Account #: [id number] Date of Birth: 02/15/1956 Admit Type: Outpatient Age: 66 Room: [facility] ENDO ROOM 1 Gender: Male Note Status: Finalized Instrument Name: Colonoscope [id number] Procedure:             Colonoscopy Indications:           High risk colon cancer surveillance: Personal history                         of colonic polyps Providers:             Rueben Bash, DO Referring MD:          Annamaria Helling DO, DO (Referring MD) Medicines:             Monitored Anesthesia Care Complications:         No immediate complications. Estimated blood loss:                         Minimal. Procedure:             Pre-Anesthesia Assessment:                        - Prior to the procedure, a History and Physical was                         performed, and patient medications and allergies were                         reviewed. The patient is competent. The risks and                         benefits of the procedure and the sedation options and                         risks were discussed with the patient. All questions                         were answered and informed consent was obtained.                         Patient identification and proposed procedure were                         verified by the physician, the nurse, the anesthetist                         and the technician. Mental Status Examination: alert                         and oriented. Airway Examination: normal oropharyngeal                         airway and neck mobility. Respiratory Examination:                         clear to auscultation. CV Examination: RRR, no  murmurs, no S3 or S4. Prophylactic Antibiotics: The                         patient does not require prophylactic antibiotics.                         Prior Anticoagulants: The patient has taken no                          previous anticoagulant or antiplatelet agents. ASA                         Grade Assessment: II - A patient with mild systemic                         disease. After reviewing the risks and benefits, the                         patient was deemed in satisfactory condition to                         undergo the procedure. The anesthesia plan was to use                         monitored anesthesia care (MAC). Immediately prior to                         administration of medications, the patient was                         re-assessed for adequacy to receive sedatives. The                         heart rate, respiratory rate, oxygen saturations,                         blood pressure, adequacy of pulmonary ventilation, and                         response to care were monitored throughout the                         procedure. The physical status of the patient was                         re-assessed after the procedure.                        After obtaining informed consent, the colonoscope was                         passed under direct vision. Throughout the procedure,                         the patient's blood pressure, pulse, and oxygen                         saturations were monitored continuously. The  Colonoscope was introduced through the anus and                         advanced to the the terminal ileum, with                         identification of the appendiceal orifice and IC                         valve. The colonoscopy was performed without                         difficulty. The patient tolerated the procedure well.                         The quality of the bowel preparation was evaluated                         using the BBPS [facility] Bowel Preparation Scale) with                         scores of: Right Colon = 2 (minor amount of residual                         staining, small fragments of stool and/or opaque                          liquid, but mucosa seen well), Transverse Colon = 3                         (entire mucosa seen well with no residual staining,                         small fragments of stool or opaque liquid) and Left                         Colon = 3 (entire mucosa seen well with no residual                         staining, small fragments of stool or opaque liquid).                         The total BBPS score equals 8. The quality of the                         bowel preparation was excellent. The terminal ileum,                         ileocecal valve, appendiceal orifice, and rectum were                         photographed. Findings:      The digital rectal exam was normal. Pertinent negatives include normal       sphincter tone.      Skin tags were found on perianal exam.      Localized inflammation, mild in severity and characterized by erosions  was found in the terminal ileum. Biopsies were taken with a cold forceps       for histology. Likely secondary to nsaid use (meloxicam) Estimated blood       loss was minimal.      Multiple small-mouthed diverticula were found in the left colon.       Estimated blood loss: none.      Non-bleeding internal hemorrhoids were found during retroflexion. The       hemorrhoids were Grade II (internal hemorrhoids that prolapse but reduce       spontaneously). Estimated blood loss: none.      Four sessile polyps were found in the recto-sigmoid colon (2) and       transverse colon (2). The polyps were 1 to 2 mm in size. These polyps       were removed with a jumbo cold forceps. Resection and retrieval were       complete. Estimated blood loss was minimal.      The exam was otherwise without abnormality on direct and retroflexion       views. Impression:            - Perianal skin tags found on perianal exam.                        - Ileitis. Biopsied.                        - Diverticulosis in the left colon.                        -  Non-bleeding internal hemorrhoids.                        - Four 1 to 2 mm polyps at the recto-sigmoid colon and                         in the transverse colon, removed with a jumbo cold                         forceps. Resected and retrieved.                        - The examination was otherwise normal on direct and                         retroflexion views. Recommendation:        - Patient has a contact number available for                         emergencies. The signs and symptoms of potential                         delayed complications were discussed with the patient.                         Return to normal activities tomorrow. Written                         discharge instructions were provided to the patient.                        -  Discharge patient to home.                        - Discharge patient to home.                        - Resume previous diet.                        - Continue present medications.                        - Await pathology results.                        - Repeat colonoscopy for surveillance based on                         pathology results.                        - Return to GI office. Recommend Barretts Esophagus                         Screening.                        - The findings and recommendations were discussed with                         the patient. Procedure Code(s):     --- Professional ---                        [phone], Colonoscopy, flexible; with biopsy, single or                         multiple Diagnosis Code(s):     --- Professional ---                        Z86.010, Personal history of colonic polyps                        K52.9, Noninfective gastroenteritis and colitis,                         unspecified                        K64.1, Second degree hemorrhoids                        K63.5, Polyp of colon                        K64.4, Residual hemorrhoidal skin tags                        K57.30, Diverticulosis of large  intestine without                         perforation or abscess without bleeding CPT copyright 2019 American Medical Association. All rights reserved. The codes documented in this report are preliminary and upon coder review may  be revised to meet current compliance requirements. Attending Participation:      I personally performed the entire procedure. Volney American, DO Annamaria Helling DO, DO 08/04/2022 8:20:20 AM This report has been signed electronically. Number of Addenda: 0 Note Initiated On: 08/04/2022 7:33 AM Scope Withdrawal Time: 0 hours 18 minutes 47 seconds  Total Procedure Duration: 0 hours 22 minutes 57 seconds  Estimated Blood Loss:  Estimated blood loss was minimal.      [facility]

## 2022-08-04 NOTE — Anesthesia Preprocedure Evaluation (Signed)
Anesthesia Evaluation  Patient identified by MRN, date of birth, ID band Patient awake    Reviewed: Allergy & Precautions, NPO status , Patient's Chart, lab work & pertinent test results  Airway Mallampati: III  TM Distance: >3 FB Neck ROM: full    Dental  (+) Chipped   Pulmonary neg shortness of breath, COPD, Current Smoker,    Pulmonary exam normal        Cardiovascular hypertension, (-) anginanegative cardio ROS Normal cardiovascular exam     Neuro/Psych negative neurological ROS  negative psych ROS   GI/Hepatic negative GI ROS, Neg liver ROS,   Endo/Other  negative endocrine ROS  Renal/GU negative Renal ROS  negative genitourinary   Musculoskeletal   Abdominal   Peds  Hematology negative hematology ROS (+)   Anesthesia Other Findings Past Medical History: No date: Chronic cough 05/16/2010: Hemorrhoids, thrombosed No date: Hypertension No date: ST segment depression No date: Tendinitis of elbow  Past Surgical History: No date: HERNIA REPAIR 11/06/1998: INGUINAL HERNIA REPAIR; Right     Comment:  Right Inguilal Herniorrhaphy  11/07/1999: INGUINAL HERNIA REPAIR; Left     Comment:  Left Inguinal Herniorrhaphy  BMI    Body Mass Index: 26.98 kg/m      Reproductive/Obstetrics negative OB ROS                             Anesthesia Physical Anesthesia Plan  ASA: 2  Anesthesia Plan: General   Post-op Pain Management: Minimal or no pain anticipated   Induction: Intravenous  PONV Risk Score and Plan: 3 and Propofol infusion, TIVA and Ondansetron  Airway Management Planned: Nasal Cannula  Additional Equipment: None  Intra-op Plan:   Post-operative Plan:   Informed Consent: I have reviewed the patients History and Physical, chart, labs and discussed the procedure including the risks, benefits and alternatives for the proposed anesthesia with the patient or authorized  representative who has indicated his/her understanding and acceptance.     Dental advisory given  Plan Discussed with: CRNA and Surgeon  Anesthesia Plan Comments: (Discussed risks of anesthesia with patient, including possibility of difficulty with spontaneous ventilation under anesthesia necessitating airway intervention, PONV, and rare risks such as cardiac or respiratory or neurological events, and allergic reactions. Discussed the role of CRNA in patient's perioperative care. Patient understands.)        Anesthesia Quick Evaluation

## 2022-08-07 ENCOUNTER — Encounter: Payer: Self-pay | Admitting: Gastroenterology

## 2022-08-07 LAB — SURGICAL PATHOLOGY

## 2022-08-09 DIAGNOSIS — J431 Panlobular emphysema: Secondary | ICD-10-CM | POA: Diagnosis not present

## 2022-10-11 DIAGNOSIS — M5451 Vertebrogenic low back pain: Secondary | ICD-10-CM | POA: Diagnosis not present

## 2022-10-11 DIAGNOSIS — M7918 Myalgia, other site: Secondary | ICD-10-CM | POA: Diagnosis not present

## 2022-10-11 DIAGNOSIS — M9904 Segmental and somatic dysfunction of sacral region: Secondary | ICD-10-CM | POA: Diagnosis not present

## 2022-10-11 DIAGNOSIS — M50323 Other cervical disc degeneration at C6-C7 level: Secondary | ICD-10-CM | POA: Diagnosis not present

## 2022-10-11 DIAGNOSIS — M9903 Segmental and somatic dysfunction of lumbar region: Secondary | ICD-10-CM | POA: Diagnosis not present

## 2022-10-11 DIAGNOSIS — M50322 Other cervical disc degeneration at C5-C6 level: Secondary | ICD-10-CM | POA: Diagnosis not present

## 2022-10-11 DIAGNOSIS — M542 Cervicalgia: Secondary | ICD-10-CM | POA: Diagnosis not present

## 2022-10-11 DIAGNOSIS — M9901 Segmental and somatic dysfunction of cervical region: Secondary | ICD-10-CM | POA: Diagnosis not present

## 2022-10-16 DIAGNOSIS — E860 Dehydration: Secondary | ICD-10-CM | POA: Diagnosis not present

## 2022-11-06 DIAGNOSIS — Z8619 Personal history of other infectious and parasitic diseases: Secondary | ICD-10-CM

## 2022-11-06 HISTORY — DX: Personal history of other infectious and parasitic diseases: Z86.19

## 2022-11-10 DIAGNOSIS — M170 Bilateral primary osteoarthritis of knee: Secondary | ICD-10-CM | POA: Diagnosis not present

## 2022-11-14 DIAGNOSIS — J441 Chronic obstructive pulmonary disease with (acute) exacerbation: Secondary | ICD-10-CM | POA: Diagnosis not present

## 2022-11-14 DIAGNOSIS — I10 Essential (primary) hypertension: Secondary | ICD-10-CM | POA: Diagnosis not present

## 2022-11-21 DIAGNOSIS — Z79899 Other long term (current) drug therapy: Secondary | ICD-10-CM | POA: Diagnosis not present

## 2022-11-21 DIAGNOSIS — R197 Diarrhea, unspecified: Secondary | ICD-10-CM | POA: Diagnosis not present

## 2022-11-21 DIAGNOSIS — J439 Emphysema, unspecified: Secondary | ICD-10-CM | POA: Diagnosis not present

## 2022-11-21 DIAGNOSIS — I10 Essential (primary) hypertension: Secondary | ICD-10-CM | POA: Diagnosis not present

## 2022-11-21 DIAGNOSIS — R059 Cough, unspecified: Secondary | ICD-10-CM | POA: Diagnosis not present

## 2022-11-21 DIAGNOSIS — J431 Panlobular emphysema: Secondary | ICD-10-CM | POA: Diagnosis not present

## 2022-11-23 ENCOUNTER — Emergency Department
Admission: EM | Admit: 2022-11-23 | Discharge: 2022-11-23 | Disposition: A | Discharge status: Home / Self Care | Payer: Medicare HMO | Attending: Emergency Medicine | Admitting: Emergency Medicine

## 2022-11-23 ENCOUNTER — Other Ambulatory Visit: Payer: Self-pay

## 2022-11-23 DIAGNOSIS — A0472 Enterocolitis due to Clostridium difficile, not specified as recurrent: Secondary | ICD-10-CM

## 2022-11-23 DIAGNOSIS — R197 Diarrhea, unspecified: Secondary | ICD-10-CM | POA: Diagnosis not present

## 2022-11-23 DIAGNOSIS — K529 Noninfective gastroenteritis and colitis, unspecified: Secondary | ICD-10-CM

## 2022-11-23 LAB — COMPREHENSIVE METABOLIC PANEL
ALT: 58 U/L — ABNORMAL HIGH (ref 0–44)
AST: 34 U/L (ref 15–41)
Albumin: 3.9 g/dL (ref 3.5–5.0)
Alkaline Phosphatase: 62 U/L (ref 38–126)
Anion gap: 10 (ref 5–15)
BUN: 26 mg/dL — ABNORMAL HIGH (ref 8–23)
CO2: 26 mmol/L (ref 22–32)
Calcium: 8.2 mg/dL — ABNORMAL LOW (ref 8.9–10.3)
Chloride: 96 mmol/L — ABNORMAL LOW (ref 98–111)
Creatinine, Ser: 1.04 mg/dL (ref 0.61–1.24)
GFR, Estimated: >60 mL/min (ref >60)
Glucose, Bld: 110 mg/dL — ABNORMAL HIGH (ref 70–99)
Potassium: 3.2 mmol/L — ABNORMAL LOW (ref 3.5–5.1)
Sodium: 132 mmol/L — ABNORMAL LOW (ref 135–145)
Total Bilirubin: 0.5 mg/dL (ref 0.3–1.2)
Total Protein: 7.2 g/dL (ref 6.5–8.1)

## 2022-11-23 LAB — CBC
HCT: 43.2 % (ref 39.0–52.0)
Hemoglobin: 14.9 g/dL (ref 13.0–17.0)
MCH: 31 pg (ref 26.0–34.0)
MCHC: 34.5 g/dL (ref 30.0–36.0)
MCV: 89.8 fL (ref 80.0–100.0)
Platelets: 261 10*3/uL (ref 150–400)
RBC: 4.81 MIL/uL (ref 4.22–5.81)
RDW: 11.7 % (ref 11.5–15.5)
WBC: 17.3 10*3/uL — ABNORMAL HIGH (ref 4.0–10.5)
nRBC: 0 % (ref 0.0–0.2)

## 2022-11-23 MED ORDER — SODIUM CHLORIDE 0.9 % IV SOLN: Freq: Once | INTRAVENOUS | Status: AC

## 2022-11-23 NOTE — Discharge Instructions (Addendum)
As we discussed, you have an outpatient stool study which should return very soon which will help [place] know if this is C. difficile colitis if your symptoms worsen you can return to the emergency department

## 2022-11-23 NOTE — ED Triage Notes (Addendum)
Pt c/o diarrhea x1 week. Pt denies any other symptoms and reports COPD and hypertension

## 2022-11-23 NOTE — ED Provider Notes (Signed)
[facility] Provider Note    Event Date/Time   First MD Initiated Contact with Patient 11/23/22 1251     (approximate)   History   Diarrhea   HPI  Joe Dillon is a 67 y.o. male who presents with complaints of nearly a week of diarrhea which started while taking Augmentin for bronchitis.  He denies abdominal pain.  No significant vomiting.  Is tolerating p.o.'s but has to have diarrhea shortly thereafter.  Reports foul-smelling diarrhea.  No recent travel.  Gave a stool sample to his PCP, currently being analyzed at [facility]     Physical Exam   Triage Vital Signs: ED Triage Vitals [11/23/22 1248]  Enc Vitals Group     BP (!) 172/95     Pulse Rate 85     Resp 20     Temp 98.4 F (36.9 C)     Temp Source Oral     SpO2 96 %     Weight 82.1 kg (181 lb)     Height 1.778 m ('5\' 10"'$ )     Head Circumference      Peak Flow      Pain Score 0     Pain Loc      Pain Edu?      Excl. in [place]?     Most recent vital signs: Vitals:   11/23/22 1248  BP: (!) 172/95  Pulse: 85  Resp: 20  Temp: 98.4 F (36.9 C)  SpO2: 96%     General: Awake, no distress.  CV:  Good peripheral perfusion.  Resp:  Normal effort.  Abd:  No distention.  Other:    ED Results / Procedures / Treatments   Labs (all labs ordered are listed, but only abnormal results are displayed) Labs Reviewed  CBC - Abnormal; Notable for the following components:      Result Value   WBC 17.3 (*)    All other components within normal limits  COMPREHENSIVE METABOLIC PANEL - Abnormal; Notable for the following components:   Sodium 132 (*)    Potassium 3.2 (*)    Chloride 96 (*)    Glucose, Bld 110 (*)    BUN 26 (*)    Calcium 8.2 (*)    ALT 58 (*)    All other components within normal limits  C DIFFICILE QUICK SCREEN W PCR REFLEX    GASTROINTESTINAL PANEL BY PCR, STOOL (REPLACES STOOL CULTURE)     EKG     RADIOLOGY     PROCEDURES:  Critical Care performed:    Procedures   MEDICATIONS ORDERED IN ED: Medications  0.9 %  sodium chloride infusion (0 mLs Intravenous Stopped 11/23/22 1409)     IMPRESSION / MDM / ASSESSMENT AND PLAN / ED COURSE  I reviewed the triage vital signs and the nursing notes. Patient's presentation is most consistent with acute presentation with potential threat to life or bodily function.  Patient presents with diarrhea as detailed above.  Given history suspicious for C. difficile, differential also includes other infectious causes of diarrhea such as Salmonella  No significant abdominal pain, overall well-appearing with normal vital signs.  Treated with IV fluids, mild hypokalemia, mild hyponatremia likely from diarrhea  Work obtained which does demonstrate elevated white blood cell count, enhancing my suspicion for C. Difficile  Discussed at length with patient and his wife, we could reobtain stool sample here but patient notes that he does not have to have diarrhea now and would rather  be discharged to see what his stool sample shows that he gave to his PCP  He knows that he can return at any time       FINAL CLINICAL IMPRESSION(S) / ED DIAGNOSES   Final diagnoses:  C. difficile diarrhea  Colitis     Rx / DC Orders   ED Discharge Orders     None        Note:  This document was prepared using Dragon voice recognition software and may include unintentional dictation errors.   Lavonia Drafts, MD 11/23/22 [phone]

## 2022-11-30 ENCOUNTER — Other Ambulatory Visit: Payer: Self-pay

## 2022-11-30 ENCOUNTER — Emergency Department: Payer: Medicare HMO

## 2022-11-30 ENCOUNTER — Inpatient Hospital Stay
Admission: EM | Admit: 2022-11-30 | Discharge: 2022-12-03 | DRG: 194 | Disposition: A | Discharge status: Home / Self Care | Payer: Medicare HMO | Attending: Internal Medicine | Admitting: Internal Medicine

## 2022-11-30 DIAGNOSIS — R030 Elevated blood-pressure reading, without diagnosis of hypertension: Secondary | ICD-10-CM | POA: Diagnosis present

## 2022-11-30 DIAGNOSIS — J101 Influenza due to other identified influenza virus with other respiratory manifestations: Principal | ICD-10-CM | POA: Diagnosis present

## 2022-11-30 DIAGNOSIS — A419 Sepsis, unspecified organism: Secondary | ICD-10-CM | POA: Diagnosis not present

## 2022-11-30 DIAGNOSIS — R6510 Systemic inflammatory response syndrome (SIRS) of non-infectious origin without acute organ dysfunction: Secondary | ICD-10-CM

## 2022-11-30 DIAGNOSIS — I10 Essential (primary) hypertension: Secondary | ICD-10-CM | POA: Diagnosis present

## 2022-11-30 DIAGNOSIS — R000 Tachycardia, unspecified: Secondary | ICD-10-CM | POA: Diagnosis not present

## 2022-11-30 DIAGNOSIS — Z8619 Personal history of other infectious and parasitic diseases: Secondary | ICD-10-CM | POA: Diagnosis present

## 2022-11-30 DIAGNOSIS — Z791 Long term (current) use of non-steroidal anti-inflammatories (NSAID): Secondary | ICD-10-CM

## 2022-11-30 DIAGNOSIS — R7989 Other specified abnormal findings of blood chemistry: Secondary | ICD-10-CM | POA: Diagnosis not present

## 2022-11-30 DIAGNOSIS — R0600 Dyspnea, unspecified: Secondary | ICD-10-CM | POA: Diagnosis not present

## 2022-11-30 DIAGNOSIS — R0602 Shortness of breath: Secondary | ICD-10-CM | POA: Diagnosis not present

## 2022-11-30 DIAGNOSIS — F17210 Nicotine dependence, cigarettes, uncomplicated: Secondary | ICD-10-CM | POA: Diagnosis present

## 2022-11-30 DIAGNOSIS — E871 Hypo-osmolality and hyponatremia: Secondary | ICD-10-CM | POA: Diagnosis not present

## 2022-11-30 DIAGNOSIS — Z1152 Encounter for screening for COVID-19: Secondary | ICD-10-CM | POA: Diagnosis not present

## 2022-11-30 DIAGNOSIS — E878 Other disorders of electrolyte and fluid balance, not elsewhere classified: Secondary | ICD-10-CM | POA: Diagnosis not present

## 2022-11-30 DIAGNOSIS — M722 Plantar fascial fibromatosis: Secondary | ICD-10-CM | POA: Diagnosis not present

## 2022-11-30 DIAGNOSIS — Z7951 Long term (current) use of inhaled steroids: Secondary | ICD-10-CM

## 2022-11-30 DIAGNOSIS — R778 Other specified abnormalities of plasma proteins: Secondary | ICD-10-CM | POA: Diagnosis not present

## 2022-11-30 DIAGNOSIS — I2489 Other forms of acute ischemic heart disease: Secondary | ICD-10-CM | POA: Diagnosis present

## 2022-11-30 DIAGNOSIS — J449 Chronic obstructive pulmonary disease, unspecified: Secondary | ICD-10-CM | POA: Diagnosis not present

## 2022-11-30 DIAGNOSIS — R69 Illness, unspecified: Secondary | ICD-10-CM | POA: Diagnosis not present

## 2022-11-30 DIAGNOSIS — R062 Wheezing: Secondary | ICD-10-CM | POA: Diagnosis not present

## 2022-11-30 DIAGNOSIS — J441 Chronic obstructive pulmonary disease with (acute) exacerbation: Secondary | ICD-10-CM | POA: Diagnosis present

## 2022-11-30 HISTORY — DX: Systemic inflammatory response syndrome (sirs) of non-infectious origin without acute organ dysfunction: R65.10

## 2022-11-30 LAB — RESP PANEL BY RT-PCR (RSV, FLU A&B, COVID)  RVPGX2
Influenza A by PCR: POSITIVE — AB
Influenza B by PCR: NEGATIVE
Resp Syncytial Virus by PCR: NEGATIVE
SARS Coronavirus 2 by RT PCR: NEGATIVE

## 2022-11-30 LAB — BASIC METABOLIC PANEL
Anion gap: 8 (ref 5–15)
BUN: 21 mg/dL (ref 8–23)
CO2: 28 mmol/L (ref 22–32)
Calcium: 8.6 mg/dL — ABNORMAL LOW (ref 8.9–10.3)
Chloride: 97 mmol/L — ABNORMAL LOW (ref 98–111)
Creatinine, Ser: 0.94 mg/dL (ref 0.61–1.24)
GFR, Estimated: >60 mL/min (ref >60)
Glucose, Bld: 132 mg/dL — ABNORMAL HIGH (ref 70–99)
Potassium: 3.8 mmol/L (ref 3.5–5.1)
Sodium: 133 mmol/L — ABNORMAL LOW (ref 135–145)

## 2022-11-30 LAB — CBC
HCT: 38.7 % — ABNORMAL LOW (ref 39.0–52.0)
Hemoglobin: 12.9 g/dL — ABNORMAL LOW (ref 13.0–17.0)
MCH: 30.9 pg (ref 26.0–34.0)
MCHC: 33.3 g/dL (ref 30.0–36.0)
MCV: 92.8 fL (ref 80.0–100.0)
Platelets: 161 10*3/uL (ref 150–400)
RBC: 4.17 MIL/uL — ABNORMAL LOW (ref 4.22–5.81)
RDW: 11.8 % (ref 11.5–15.5)
WBC: 8.9 10*3/uL (ref 4.0–10.5)
nRBC: 0 % (ref 0.0–0.2)

## 2022-11-30 LAB — PROCALCITONIN: Procalcitonin: <0.1 ng/mL

## 2022-11-30 LAB — LACTIC ACID, PLASMA: Lactic Acid, Venous: 1 mmol/L (ref 0.5–1.9)

## 2022-11-30 LAB — TROPONIN I (HIGH SENSITIVITY): Troponin I (High Sensitivity): 103 ng/L (ref <18)

## 2022-11-30 MED ORDER — FLUTICASONE FUROATE-VILANTEROL 100-25 MCG/ACT IN AEPB: 1.0000 | INHALATION_SPRAY | Freq: Every day | RESPIRATORY_TRACT | Status: DC

## 2022-11-30 MED ORDER — MELOXICAM 7.5 MG PO TABS
15.0000 mg | ORAL_TABLET | Freq: Every day | ORAL | Status: DC
  Administered 2022-12-01 – 2022-12-03 (×3): 15 mg via ORAL
  Filled 2022-11-30 (×3): qty 2

## 2022-11-30 MED ORDER — UMECLIDINIUM BROMIDE 62.5 MCG/ACT IN AEPB
1.0000 | INHALATION_SPRAY | Freq: Every day | RESPIRATORY_TRACT | Status: DC
  Administered 2022-12-01 – 2022-12-03 (×3): 1 via RESPIRATORY_TRACT
  Filled 2022-11-30: qty 7

## 2022-11-30 MED ORDER — OSELTAMIVIR PHOSPHATE 75 MG PO CAPS
75.0000 mg | ORAL_CAPSULE | Freq: Once | ORAL | Status: AC
  Administered 2022-11-30: 75 mg via ORAL
  Filled 2022-11-30: qty 1

## 2022-11-30 MED ORDER — VITAMIN C 500 MG PO TABS
1000.0000 mg | ORAL_TABLET | Freq: Every day | ORAL | Status: DC
  Administered 2022-12-01 – 2022-12-03 (×3): 1000 mg via ORAL
  Filled 2022-11-30 (×3): qty 2

## 2022-11-30 MED ORDER — PREDNISONE 20 MG PO TABS
20.0000 mg | ORAL_TABLET | Freq: Every day | ORAL | Status: DC
  Administered 2022-12-01 – 2022-12-03 (×3): 20 mg via ORAL
  Filled 2022-11-30 (×3): qty 1

## 2022-11-30 MED ORDER — FLUTICASONE FUROATE-VILANTEROL 100-25 MCG/ACT IN AEPB
1.0000 | INHALATION_SPRAY | Freq: Every day | RESPIRATORY_TRACT | Status: DC
  Administered 2022-12-01 – 2022-12-03 (×3): 1 via RESPIRATORY_TRACT
  Filled 2022-11-30: qty 28

## 2022-11-30 MED ORDER — OSELTAMIVIR PHOSPHATE 75 MG PO CAPS
75.0000 mg | ORAL_CAPSULE | Freq: Two times a day (BID) | ORAL | Status: DC
  Administered 2022-12-01 – 2022-12-03 (×5): 75 mg via ORAL
  Filled 2022-11-30 (×5): qty 1

## 2022-11-30 MED ORDER — MAGNESIUM HYDROXIDE 400 MG/5ML PO SUSP: 30.0000 mL | Freq: Every day | ORAL | Status: DC | PRN

## 2022-11-30 MED ORDER — PANTOPRAZOLE SODIUM 40 MG PO TBEC
40.0000 mg | DELAYED_RELEASE_TABLET | Freq: Every day | ORAL | Status: DC
  Administered 2022-12-01 – 2022-12-03 (×3): 40 mg via ORAL
  Filled 2022-11-30 (×3): qty 1

## 2022-11-30 MED ORDER — ASPIRIN 81 MG PO CHEW
324.0000 mg | CHEWABLE_TABLET | Freq: Once | ORAL | Status: AC
  Administered 2022-11-30: 324 mg via ORAL
  Filled 2022-11-30: qty 4

## 2022-11-30 MED ORDER — ACETAMINOPHEN 325 MG PO TABS: 650.0000 mg | ORAL_TABLET | Freq: Four times a day (QID) | ORAL | Status: DC | PRN

## 2022-11-30 MED ORDER — ONDANSETRON HCL 4 MG/2ML IJ SOLN: 4.0000 mg | Freq: Four times a day (QID) | INTRAMUSCULAR | Status: DC | PRN

## 2022-11-30 MED ORDER — AMLODIPINE BESYLATE 10 MG PO TABS
10.0000 mg | ORAL_TABLET | Freq: Every day | ORAL | Status: DC
  Administered 2022-12-01 – 2022-12-03 (×3): 10 mg via ORAL
  Filled 2022-11-30 (×3): qty 1

## 2022-11-30 MED ORDER — ACETAMINOPHEN 650 MG RE SUPP: 650.0000 mg | Freq: Four times a day (QID) | RECTAL | Status: DC | PRN

## 2022-11-30 MED ORDER — ALBUTEROL SULFATE (2.5 MG/3ML) 0.083% IN NEBU
2.5000 mg | INHALATION_SOLUTION | Freq: Four times a day (QID) | RESPIRATORY_TRACT | Status: DC | PRN
  Administered 2022-12-01 – 2022-12-03 (×5): 2.5 mg via RESPIRATORY_TRACT
  Filled 2022-11-30 (×5): qty 3

## 2022-11-30 MED ORDER — SODIUM CHLORIDE 0.9 % IV SOLN: INTRAVENOUS | Status: DC

## 2022-11-30 MED ORDER — BETAMETHASONE SOD PHOS & ACET 6 (3-3) MG/ML IJ SUSP: 3.0000 mg | Freq: Once | INTRAMUSCULAR | Status: DC

## 2022-11-30 MED ORDER — ACETAMINOPHEN 500 MG PO TABS
1000.0000 mg | ORAL_TABLET | Freq: Once | ORAL | Status: AC
  Administered 2022-11-30: 1000 mg via ORAL
  Filled 2022-11-30: qty 2

## 2022-11-30 MED ORDER — ONDANSETRON HCL 4 MG PO TABS: 4.0000 mg | ORAL_TABLET | Freq: Four times a day (QID) | ORAL | Status: DC | PRN

## 2022-11-30 MED ORDER — TRAZODONE HCL 50 MG PO TABS
25.0000 mg | ORAL_TABLET | Freq: Every evening | ORAL | Status: DC | PRN
  Administered 2022-12-02: 25 mg via ORAL
  Filled 2022-11-30: qty 1

## 2022-11-30 MED ORDER — ENOXAPARIN SODIUM 40 MG/0.4ML IJ SOSY
40.0000 mg | PREFILLED_SYRINGE | INTRAMUSCULAR | Status: DC
  Administered 2022-12-01 – 2022-12-02 (×2): 40 mg via SUBCUTANEOUS
  Filled 2022-11-30 (×2): qty 0.4

## 2022-11-30 MED ORDER — ADULT MULTIVITAMIN W/MINERALS CH
1.0000 | ORAL_TABLET | Freq: Every day | ORAL | Status: DC
  Administered 2022-12-01 – 2022-12-03 (×3): 1 via ORAL
  Filled 2022-11-30 (×3): qty 1

## 2022-11-30 NOTE — Sepsis Progress Note (Addendum)
Elink monitoring for the code sepsis protocol. Notified provider of need to order antibiotics.  Per Dr. Sidney Ace, no antibiotics ordered because sepsis is from influenza.

## 2022-11-30 NOTE — ED Triage Notes (Addendum)
Pt comes from home via POV with c/o SOB. Pt states he's been SOB for 1-2 weeks but got worse today. Pt with increased work of breathing. Pt states he was on prednisone for bronchitis but had to get off it because off another medication he was supposed to take for salmonella. Pt has hx of emphysema. Pt with productive cough and fever at home, took ibuprofen PTA. Pt denies CP

## 2022-11-30 NOTE — ED Provider Notes (Signed)
[facility] Provider Note    Event Date/Time   First MD Initiated Contact with Patient 11/30/22 2300     (approximate)   History   Shortness of Breath   HPI  Joe Dillon is a 67 y.o. male with history of hypertension, COPD who presents to the emergency department 2 days of cough, fevers, body aches and shortness of breath.  No chest pain.  Recently treated for Salmonella infection with vancomycin by Dr. Doy Hutching.  No abdominal pain.  Has been on 10 days of antibiotics.  Family reports he was negative for C. difficile.   History provided by patient and wife.    Past Medical History:  Diagnosis Date   Chronic cough    Hemorrhoids, thrombosed 05/16/2010   Hypertension    ST segment depression    Tendinitis of elbow     Past Surgical History:  Procedure Laterality Date   COLONOSCOPY N/A 08/04/2022   Procedure: COLONOSCOPY;  Surgeon: Annamaria Helling, DO;  Location: [facility]COPY;  Service: Gastroenterology;  Laterality: N/A;   HERNIA REPAIR     INGUINAL HERNIA REPAIR Right 11/06/1998   Right Inguilal Herniorrhaphy    INGUINAL HERNIA REPAIR Left 11/07/1999   Left Inguinal Herniorrhaphy    MEDICATIONS:  Prior to Admission medications   Medication Sig Start Date End Date Taking? Authorizing Provider  albuterol (VENTOLIN HFA) 108 (90 Base) MCG/ACT inhaler Inhale 2 puffs into the lungs every 6 (six) hours as needed for wheezing or shortness of breath. 11/01/21   Jerrol Banana., MD  amLODipine (NORVASC) 10 MG tablet Take 1 tablet (10 mg total) by mouth daily. 09/20/21   Jerrol Banana., MD  Ascorbic Acid (VITAMIN C) 1000 MG tablet Take 1,000 mg by mouth daily.    [provider]  clotrimazole (MYCELEX) 10 MG troche DISSOLVE ONE TABLET IN MOUTH FIVE TIMES DAILY Patient not taking: Reported on 08/04/2022 03/04/21   Chrismon, Vickki Muff, PA-C  esomeprazole (NEXIUM) 20 MG capsule Take 20 mg by mouth daily at 12 noon.     [provider]  Fluticasone-Umeclidin-Vilant (TRELEGY ELLIPTA) 100-62.5-25 MCG/INH AEPB Take one puff daily Please schedule an office visit before anymore refills. 08/02/21   Virginia Crews, MD  lisinopril (ZESTRIL) 40 MG tablet Take 1 tablet (40 mg total) by mouth daily. Patient not taking: Reported on 08/04/2022 09/20/21   Jerrol Banana., MD  meloxicam (MOBIC) 15 MG tablet Take 15 mg by mouth daily.    [provider]  Multiple Vitamin (MULTIVITAMIN) tablet Take 1 tablet by mouth daily.    [provider]  predniSONE (DELTASONE) 20 MG tablet Take 1 tablet (20 mg total) by mouth daily with breakfast. Patient not taking: Reported on 08/04/2022 01/14/21   Trinna Post, PA-C  TRELEGY ELLIPTA 100-62.5-25 MCG/ACT AEPB INHALE ONE PUFF BY MOUTH DAILY 11/30/21   Myles Gip, DO    Physical Exam   Triage Vital Signs: ED Triage Vitals  Enc Vitals Group     BP 11/30/22 2150 (!) 170/95     Pulse Rate 11/30/22 2150 (!) 116     Resp 11/30/22 2150 (!) 24     Temp 11/30/22 2150 99.4 F (37.4 C)     Temp Source 11/30/22 2150 Oral     SpO2 11/30/22 2150 93 %     Weight 11/30/22 2151 185 lb (83.9 kg)     Height 11/30/22 2151 '5\' 10"'$  (1.778 m)  Head Circumference --      Peak Flow --      Pain Score 11/30/22 2151 0     Pain Loc --      Pain Edu? --      Excl. in [place]? --     Most recent vital signs: Vitals:   12/01/22 0000 12/01/22 0015  BP: (!) 169/127   Pulse: (!) 105 96  Resp: 18 14  Temp:    SpO2:  93%    CONSTITUTIONAL: Alert, responds appropriately to questions. Well-appearing; well-nourished, elderly HEAD: Normocephalic, atraumatic EYES: Conjunctivae clear, pupils appear equal, sclera nonicteric ENT: normal nose; moist mucous membranes NECK: Supple, normal ROM CARD: Regular and tachycardic; S1 and S2 appreciated RESP: Patient is tachypneic, breath sounds clear and equal bilaterally; no wheezes, no rhonchi, no rales, no hypoxia or  respiratory distress, speaking full sentences ABD/GI: Non-distended; soft, non-tender, no rebound, no guarding, no peritoneal signs BACK: The back appears normal EXT: Normal ROM in all joints; no deformity noted, no edema, no calf tenderness or calf swelling SKIN: Normal color for age and race; warm; no rash on exposed skin NEURO: Moves all extremities equally, normal speech PSYCH: The patient's mood and manner are appropriate.   ED Results / Procedures / Treatments   LABS: (all labs ordered are listed, but only abnormal results are displayed) Labs Reviewed  RESP PANEL BY RT-PCR (RSV, FLU A&B, COVID)  RVPGX2 - Abnormal; Notable for the following components:      Result Value   Influenza A by PCR POSITIVE (*)    All other components within normal limits  BASIC METABOLIC PANEL - Abnormal; Notable for the following components:   Sodium 133 (*)    Chloride 97 (*)    Glucose, Bld 132 (*)    Calcium 8.6 (*)    All other components within normal limits  CBC - Abnormal; Notable for the following components:   RBC 4.17 (*)    Hemoglobin 12.9 (*)    HCT 38.7 (*)    All other components within normal limits  TROPONIN I (HIGH SENSITIVITY) - Abnormal; Notable for the following components:   Troponin I (High Sensitivity) 103 (*)    All other components within normal limits  CULTURE, BLOOD (ROUTINE X 2)  CULTURE, BLOOD (ROUTINE X 2)  LACTIC ACID, PLASMA  PROCALCITONIN  URINALYSIS, ROUTINE W REFLEX MICROSCOPIC  HIV ANTIBODY (ROUTINE TESTING W REFLEX)  BASIC METABOLIC PANEL  CBC  PROCALCITONIN  PROCALCITONIN  TROPONIN I (HIGH SENSITIVITY)     EKG:  EKG Interpretation  Date/Time:  Thursday November 30 2022 21:54:46 EST Ventricular Rate:  111 PR Interval:  128 QRS Duration: 92 QT Interval:  318 QTC Calculation: 432 R Axis:   61 Text Interpretation: Sinus tachycardia Cannot rule out Inferior infarct , age undetermined Abnormal ECG When compared with ECG of 30-Nov-2022 21:54,  (unconfirmed) No significant change was found Confirmed by Pryor Curia [phone]) on 11/30/2022 11:09:09 PM         RADIOLOGY: My personal review and interpretation of imaging: Chest x-ray clear.  I have personally reviewed all radiology reports.   DG Chest 2 View  Result Date: 11/30/2022 CLINICAL DATA:  SOB EXAM: CHEST - 2 VIEW COMPARISON:  Chest x-ray 04/12/2018 FINDINGS: The heart and mediastinal contours are within normal limits. No focal consolidation. No pulmonary edema. No pleural effusion. No pneumothorax. No acute osseous abnormality. Plate and screw fixation of the left clavicle. IMPRESSION: No active cardiopulmonary disease. Electronically Signed   By:  Iven Finn M.D.   On: 11/30/2022 22:20     PROCEDURES:  Critical Care performed: Yes, see critical care procedure note(s)   CRITICAL CARE Performed by: Cyril Mourning Joud Ingwersen   Total critical care time: 30 minutes  Critical care time was exclusive of separately billable procedures and treating other patients.  Critical care was necessary to treat or prevent imminent or life-threatening deterioration.  Critical care was time spent personally by me on the following activities: development of treatment plan with patient and/or surrogate as well as nursing, discussions with consultants, evaluation of patient's response to treatment, examination of patient, obtaining history from patient or surrogate, ordering and performing treatments and interventions, ordering and review of laboratory studies, ordering and review of radiographic studies, pulse oximetry and re-evaluation of patient's condition.   Marland Kitchen1-3 Lead EKG Interpretation  Performed by: Aviyon Hocevar, Delice Bison, DO Authorized by: [place] Duquette, Delice Bison, DO     Interpretation: abnormal     ECG rate:  105   ECG rate assessment: tachycardic     Rhythm: sinus tachycardia     Ectopy: none     Conduction: normal       IMPRESSION / MDM / ASSESSMENT AND PLAN / ED COURSE  I reviewed the  triage vital signs and the nursing notes.    Patient here with cough, fevers, body aches, shortness of breath.  The patient is on the cardiac monitor to evaluate for evidence of arrhythmia and/or significant heart rate changes.   DIFFERENTIAL DIAGNOSIS (includes but not limited to):   Viral URI, pneumonia, pneumothorax, CHF, ACS, PE   Patient's presentation is most consistent with acute presentation with potential threat to life or bodily function.   PLAN: Workup initiated from triage.  No leukocytosis.  Normal electrolytes.  Positive for flu A.  Lactic normal.  Troponin elevated at 103 with no EKG changes.  Likely from demand ischemia from sepsis from influenza as he is tachycardic and tachypneic.  He has no chest pain or chest discomfort.  Will add on procalcitonin, cultures.  Chest x-ray reviewed and interpreted by myself and the radiologist and shows no acute abnormality.  Will start Tamiflu.  Given elevated troponin, will give aspirin.  Low-grade oral temperature here of 99.4.  Will give Tylenol.   MEDICATIONS GIVEN IN ED: Medications  meloxicam (MOBIC) tablet 15 mg (has no administration in time range)  amLODipine (NORVASC) tablet 10 mg (has no administration in time range)  betamethasone acetate-betamethasone sodium phosphate (CELESTONE) injection 3 mg (has no administration in time range)  betamethasone acetate-betamethasone sodium phosphate (CELESTONE) injection 3 mg (has no administration in time range)  predniSONE (DELTASONE) tablet 20 mg (has no administration in time range)  pantoprazole (PROTONIX) EC tablet 40 mg (has no administration in time range)  ascorbic acid (VITAMIN C) tablet 1,000 mg (has no administration in time range)  multivitamin tablet 1 tablet (has no administration in time range)  albuterol (VENTOLIN HFA) 108 (90 Base) MCG/ACT inhaler 2 puff (has no administration in time range)  fluticasone furoate-vilanterol (BREO ELLIPTA) 100-25 MCG/ACT 1 puff (has no  administration in time range)  fluticasone furoate-vilanterol (BREO ELLIPTA) 100-25 MCG/ACT 1 puff (has no administration in time range)    And  umeclidinium bromide (INCRUSE ELLIPTA) 62.5 MCG/ACT 1 puff (has no administration in time range)  enoxaparin (LOVENOX) injection 40 mg (has no administration in time range)  0.9 %  sodium chloride infusion (has no administration in time range)  acetaminophen (TYLENOL) tablet 650 mg (has no administration in  time range)    Or  acetaminophen (TYLENOL) suppository 650 mg (has no administration in time range)  traZODone (DESYREL) tablet 25 mg (has no administration in time range)  magnesium hydroxide (MILK OF MAGNESIA) suspension 30 mL (has no administration in time range)  ondansetron (ZOFRAN) tablet 4 mg (has no administration in time range)    Or  ondansetron (ZOFRAN) injection 4 mg (has no administration in time range)  oseltamivir (TAMIFLU) capsule 75 mg (has no administration in time range)  aspirin chewable tablet 324 mg (324 mg Oral Given 11/30/22 2338)  acetaminophen (TYLENOL) tablet 1,000 mg (1,000 mg Oral Given 11/30/22 2338)  oseltamivir (TAMIFLU) capsule 75 mg (75 mg Oral Given 11/30/22 2338)     ED COURSE: Vital signs improving after Tylenol.  Procalcitonin negative.  No indication for antibiotics at this time.  Will discuss with hospitalist.   CONSULTS:  Consulted and discussed patient's case with hospitalist, Dr. Sidney Ace.  I have recommended admission and consulting physician agrees and will place admission orders.  Patient (and family if present) agree with this plan.   I reviewed all nursing notes, vitals, pertinent previous records.  All labs, EKGs, imaging ordered have been independently reviewed and interpreted by myself.    OUTSIDE RECORDS REVIEWED: Reviewed recent PCP notes with Dr. Doy Hutching.       FINAL CLINICAL IMPRESSION(S) / ED DIAGNOSES   Final diagnoses:  Influenza A  Acute sepsis ([place])  Elevated troponin      Rx / DC Orders   ED Discharge Orders     None        Note:  This document was prepared using Dragon voice recognition software and may include unintentional dictation errors.   Nadirah Socorro, Delice Bison, DO 12/01/22 0022

## 2022-12-01 ENCOUNTER — Inpatient Hospital Stay: Payer: Medicare HMO

## 2022-12-01 DIAGNOSIS — J101 Influenza due to other identified influenza virus with other respiratory manifestations: Principal | ICD-10-CM

## 2022-12-01 DIAGNOSIS — J441 Chronic obstructive pulmonary disease with (acute) exacerbation: Secondary | ICD-10-CM

## 2022-12-01 DIAGNOSIS — R7989 Other specified abnormal findings of blood chemistry: Secondary | ICD-10-CM | POA: Diagnosis not present

## 2022-12-01 DIAGNOSIS — J449 Chronic obstructive pulmonary disease, unspecified: Secondary | ICD-10-CM

## 2022-12-01 DIAGNOSIS — Z8619 Personal history of other infectious and parasitic diseases: Secondary | ICD-10-CM | POA: Diagnosis present

## 2022-12-01 DIAGNOSIS — R6510 Systemic inflammatory response syndrome (SIRS) of non-infectious origin without acute organ dysfunction: Secondary | ICD-10-CM | POA: Diagnosis not present

## 2022-12-01 DIAGNOSIS — I10 Essential (primary) hypertension: Secondary | ICD-10-CM

## 2022-12-01 LAB — CBC
HCT: 36.2 % — ABNORMAL LOW (ref 39.0–52.0)
Hemoglobin: 12.1 g/dL — ABNORMAL LOW (ref 13.0–17.0)
MCH: 31.1 pg (ref 26.0–34.0)
MCHC: 33.4 g/dL (ref 30.0–36.0)
MCV: 93.1 fL (ref 80.0–100.0)
Platelets: 151 10*3/uL (ref 150–400)
RBC: 3.89 MIL/uL — ABNORMAL LOW (ref 4.22–5.81)
RDW: 11.9 % (ref 11.5–15.5)
WBC: 6.5 10*3/uL (ref 4.0–10.5)
nRBC: 0 % (ref 0.0–0.2)

## 2022-12-01 LAB — URINALYSIS, ROUTINE W REFLEX MICROSCOPIC
Bilirubin Urine: NEGATIVE
Glucose, UA: NEGATIVE mg/dL
Ketones, ur: NEGATIVE mg/dL
Leukocytes,Ua: NEGATIVE
Nitrite: NEGATIVE
Protein, ur: NEGATIVE mg/dL
Specific Gravity, Urine: 1.013 (ref 1.005–1.030)
Squamous Epithelial / HPF: NONE SEEN /HPF (ref 0–5)
pH: 6 (ref 5.0–8.0)

## 2022-12-01 LAB — BASIC METABOLIC PANEL
Anion gap: 10 (ref 5–15)
BUN: 20 mg/dL (ref 8–23)
CO2: 26 mmol/L (ref 22–32)
Calcium: 8.2 mg/dL — ABNORMAL LOW (ref 8.9–10.3)
Chloride: 97 mmol/L — ABNORMAL LOW (ref 98–111)
Creatinine, Ser: 1 mg/dL (ref 0.61–1.24)
GFR, Estimated: >60 mL/min (ref >60)
Glucose, Bld: 99 mg/dL (ref 70–99)
Potassium: 3.7 mmol/L (ref 3.5–5.1)
Sodium: 133 mmol/L — ABNORMAL LOW (ref 135–145)

## 2022-12-01 LAB — TROPONIN I (HIGH SENSITIVITY)
Troponin I (High Sensitivity): 107 ng/L (ref <18)
Troponin I (High Sensitivity): 83 ng/L — ABNORMAL HIGH (ref <18)
Troponin I (High Sensitivity): 99 ng/L — ABNORMAL HIGH (ref <18)

## 2022-12-01 LAB — PROCALCITONIN
Procalcitonin: <0.1 ng/mL
Procalcitonin: <0.1 ng/mL

## 2022-12-01 LAB — HIV ANTIBODY (ROUTINE TESTING W REFLEX): HIV Screen 4th Generation wRfx: NONREACTIVE

## 2022-12-01 MED ORDER — IOHEXOL 350 MG/ML SOLN
75.0000 mL | Freq: Once | INTRAVENOUS | Status: AC | PRN
  Administered 2022-12-01: 75 mL via INTRAVENOUS

## 2022-12-01 NOTE — Progress Notes (Signed)
CODE SEPSIS - PHARMACY COMMUNICATION  **Broad Spectrum Antibiotics should be administered within 1 hour of Sepsis diagnosis**  Time Code Sepsis Called/Page Received: 1/25 @ 2330  Antibiotics Ordered: Tamiflu   Time of 1st antibiotic administration: Tamiflu 75 mg PO X 1 on 1/25 @ 2338  Additional action taken by pharmacy:   If necessary, Name of Provider/Nurse Contacted:     Arie Powell D ,PharmD Clinical Pharmacist  12/01/2022  12:18 AM

## 2022-12-01 NOTE — Plan of Care (Signed)

## 2022-12-01 NOTE — Progress Notes (Signed)
  Transition of Care (TOC) Screening Note   Patient Details  Name: Joe Dillon Date of Birth: 02-14-1956   Transition of [facility]) CM/SW Contact:    Quin Hoop, LCSW Phone Number: 12/01/2022, 10:28 AM    Transition of Care Department [facility]) has reviewed patient and no TOC needs have been identified at this time. We will continue to monitor patient advancement through interdisciplinary progression rounds. If new patient transition needs arise, please place a TOC consult.  Patient already set up with [facility].  Contact when discharged.

## 2022-12-01 NOTE — Assessment & Plan Note (Addendum)
Secondary to influenza A.  Requiring 2 L of oxygen with no baseline oxygen use, no documented hypoxia. -Continue with bronchodilators -Continue with steroid -Wean from oxygen as tolerated

## 2022-12-01 NOTE — Plan of Care (Signed)

## 2022-12-01 NOTE — Assessment & Plan Note (Signed)
-  This like secondary to #1. - Management as above. - Will follow blood cultures. - Procalcitonin and lactic acid were normal.

## 2022-12-01 NOTE — Plan of Care (Signed)
  Problem: Pain Managment: Goal: General experience of comfort will improve Outcome: Progressing   Problem: Safety: Goal: Ability to remain free from injury will improve Outcome: Progressing   Problem: Clinical Measurements: Goal: Respiratory complications will improve Outcome: Progressing   Problem: Clinical Measurements: Goal: Will remain free from infection Outcome: Progressing   Problem: Education: Goal: Knowledge of General Education information will improve Description: Including pain rating scale, medication(s)/side effects and non-pharmacologic comfort measures Outcome: Progressing

## 2022-12-01 NOTE — ED Notes (Signed)
Pt transported to room assignment with Mallie Mussel, rn

## 2022-12-01 NOTE — H&P (Signed)
[place]   PATIENT NAME: Joe Dillon    MR#:  [id number]  DATE OF BIRTH:  12/14/1955  DATE OF ADMISSION:  11/30/2022  PRIMARY CARE PHYSICIAN: Idelle Crouch, MD   Patient is coming from: Home  REQUESTING/REFERRING PHYSICIAN: Ward, Delice Bison, DO  CHIEF COMPLAINT:   Chief Complaint  Patient presents with   Shortness of Breath    HISTORY OF PRESENT ILLNESS:  Joe Dillon is a 67 y.o. male with medical history significant for essential hypertension and COPD, who presented to the emergency room with onset of worsening dyspnea over the last couple days with associated cough productive of green sputum as well as occasional wheezing, fever and chills with a Tmax of 100.1 at home.  He denies any nausea or vomiting or diarrhea.  No loss of smell or taste.  He continues to smoke 1 pack of cigarettes per day.  No chest pain or palpitations.  No headache or dizziness or blurred vision.  No neck pain or stiffness.  No dysuria, oliguria or hematuria, urgency or frequency or flank pain.  ED Course: When the patient came to the ER, BP was 172/95 with otherwise normal vital signs.  Labs revealed hyponatremia 133 and hypochloremia of 97 with a blood glucose of 132 and calcium of 8.6.  High sensitive troponin I was 103 and later 107.  Procalcitonin was less than 0.1 and lactic acid 1.  CBC showed WBC of 8.9 and hemoglobin 12.9 hematocrit 38.7 with platelets of 161.  Influenza A PCR came back positive and influenza B as well as RSV and COVID-19 PCR came back negative.  UA was unremarkable.  2 blood cultures were drawn. EKG as reviewed by me : EKG showed sinus tachycardia with a rate of 111 Imaging: Two-view chest x-ray showed no acute cardiopulmonary disease..  The patient was given 4 baby aspirin, 1 g p.o. Tylenol and 75 mg of p.o. Tamiflu.  He will be admitted to a medical telemetry bed for further evaluation and management.  PAST MEDICAL HISTORY:   Past Medical History:  Diagnosis  Date   Chronic cough    Hemorrhoids, thrombosed 05/16/2010   Hypertension    ST segment depression    Tendinitis of elbow     PAST SURGICAL HISTORY:   Past Surgical History:  Procedure Laterality Date   COLONOSCOPY N/A 08/04/2022   Procedure: COLONOSCOPY;  Surgeon: Annamaria Helling, DO;  Location: [facility] ENDOSCOPY;  Service: Gastroenterology;  Laterality: N/A;   HERNIA REPAIR     INGUINAL HERNIA REPAIR Right 11/06/1998   Right Inguilal Herniorrhaphy    INGUINAL HERNIA REPAIR Left 11/07/1999   Left Inguinal Herniorrhaphy    SOCIAL HISTORY:   Social History   Tobacco Use   Smoking status: Every Day    Packs/day: 1.00    Years: 39.00    Total pack years: 39.00    Types: Cigarettes   Smokeless tobacco: Never  Substance Use Topics   Alcohol use: Not Currently    Comment: occ    FAMILY HISTORY:   Family History  Problem Relation Age of Onset   Alcohol abuse Maternal Grandfather     DRUG ALLERGIES:  No Known Allergies  REVIEW OF SYSTEMS:   ROS As per history of present illness. All pertinent systems were reviewed above. Constitutional, HEENT, cardiovascular, respiratory, GI, GU, musculoskeletal, neuro, psychiatric, endocrine, integumentary and hematologic systems were reviewed and are otherwise negative/unremarkable except for positive findings mentioned above in the HPI.  MEDICATIONS AT HOME:   Prior to Admission medications   Medication Sig Start Date End Date Taking? Authorizing Provider  albuterol (VENTOLIN HFA) 108 (90 Base) MCG/ACT inhaler Inhale 2 puffs into the lungs every 6 (six) hours as needed for wheezing or shortness of breath. 11/01/21   Jerrol Banana., MD  amLODipine (NORVASC) 10 MG tablet Take 1 tablet (10 mg total) by mouth daily. 09/20/21   Jerrol Banana., MD  Ascorbic Acid (VITAMIN C) 1000 MG tablet Take 1,000 mg by mouth daily.    [provider]  clotrimazole (MYCELEX) 10 MG troche DISSOLVE ONE TABLET IN MOUTH FIVE  TIMES DAILY Patient not taking: Reported on 08/04/2022 03/04/21   Chrismon, Vickki Muff, PA-C  esomeprazole (NEXIUM) 20 MG capsule Take 20 mg by mouth daily at 12 noon.    [provider]  Fluticasone-Umeclidin-Vilant (TRELEGY ELLIPTA) 100-62.5-25 MCG/INH AEPB Take one puff daily Please schedule an office visit before anymore refills. 08/02/21   Virginia Crews, MD  lisinopril (ZESTRIL) 40 MG tablet Take 1 tablet (40 mg total) by mouth daily. Patient not taking: Reported on 08/04/2022 09/20/21   Jerrol Banana., MD  meloxicam (MOBIC) 15 MG tablet Take 15 mg by mouth daily.    [provider]  Multiple Vitamin (MULTIVITAMIN) tablet Take 1 tablet by mouth daily.    [provider]  predniSONE (DELTASONE) 20 MG tablet Take 1 tablet (20 mg total) by mouth daily with breakfast. Patient not taking: Reported on 08/04/2022 01/14/21   Trinna Post, PA-C  TRELEGY ELLIPTA 100-62.5-25 MCG/ACT AEPB INHALE ONE PUFF BY MOUTH DAILY 11/30/21   Myles Gip, DO      VITAL SIGNS:  Blood pressure (!) 153/81, pulse 95, temperature 98.6 F (37 C), resp. rate 20, height '5\' 10"'$  (1.778 m), weight 83.9 kg, SpO2 93 %.  PHYSICAL EXAMINATION:  Physical Exam  GENERAL:  67 y.o.-year-old Caucasian male patient lying in the bed with no acute distress.  EYES: Pupils equal, round, reactive to light and accommodation. No scleral icterus. Extraocular muscles intact.  HEENT: Head atraumatic, normocephalic. Oropharynx and nasopharynx clear.  NECK:  Supple, no jugular venous distention. No thyroid enlargement, no tenderness.  LUNGS: Normal breath sounds bilaterally, no wheezing, rales,rhonchi or crepitation. No use of accessory muscles of respiration.  CARDIOVASCULAR: Regular rate and rhythm, S1, S2 normal. No murmurs, rubs, or gallops.  ABDOMEN: Soft, nondistended, nontender. Bowel sounds present. No organomegaly or mass.  EXTREMITIES: No pedal edema, cyanosis, or clubbing.  NEUROLOGIC:  Cranial nerves II through XII are intact. Muscle strength 5/5 in all extremities. Sensation intact. Gait not checked.  PSYCHIATRIC: The patient is alert and oriented x 3.  Normal affect and good eye contact. SKIN: No obvious rash, lesion, or ulcer.   LABORATORY PANEL:   CBC Recent Labs  Lab 11/30/22 2154  WBC 8.9  HGB 12.9*  HCT 38.7*  PLT 161   ------------------------------------------------------------------------------------------------------------------  Chemistries  Recent Labs  Lab 11/30/22 2154  NA 133*  K 3.8  CL 97*  CO2 28  GLUCOSE 132*  BUN 21  CREATININE 0.94  CALCIUM 8.6*   ------------------------------------------------------------------------------------------------------------------  Cardiac Enzymes No results for input(s): "TROPONINI" in the last 168 hours. ------------------------------------------------------------------------------------------------------------------  RADIOLOGY:  DG Chest 2 View  Result Date: 11/30/2022 CLINICAL DATA:  SOB EXAM: CHEST - 2 VIEW COMPARISON:  Chest x-ray 04/12/2018 FINDINGS: The heart and mediastinal contours are within normal limits. No focal consolidation. No pulmonary edema. No pleural effusion. No pneumothorax.  No acute osseous abnormality. Plate and screw fixation of the left clavicle. IMPRESSION: No active cardiopulmonary disease. Electronically Signed   By: Iven Finn M.D.   On: 11/30/2022 22:20      IMPRESSION AND PLAN:  Assessment and Plan: * Influenza A - The will be admitted to a medical telemetry bed. - We will continue hydration with IV normal saline. - We will continue p.o. Tamiflu. - We will follow blood cultures.  SIRS (systemic inflammatory response syndrome) (HCC) - This like secondary to #1. - Management as above. - Will follow blood cultures. - Procalcitonin and lactic acid were normal.  Elevated troponin I level - This is stabilizing and its likely related to demand ischemia. - We  will follow her troponins. - The patient has no chest pain.  Essential hypertension - We will continue  his antihypertensives.  Chronic obstructive pulmonary disease (COPD) (HCC) - We will continue his inhalers while holding off long-acting beta agonist patient given elevated troponin I.  H/O Salmonella infection - We will continue antibiotic course that is supposed to end on Monday.   DVT prophylaxis: Lovenox.  Advanced Care Planning:  Code Status: full code.  Family Communication:  The plan of care was discussed in details with the patient (and family). I answered all questions. The patient agreed to proceed with the above mentioned plan. Further management will depend upon hospital course. Disposition Plan: Back to previous home environment Consults called: none.  All the records are reviewed and case discussed with ED provider.  Status is: Inpatient   At the time of the admission, it appears that the appropriate admission status for this patient is inpatient.  This is judged to be reasonable and necessary in order to provide the required intensity of service to ensure the patient's safety given the presenting symptoms, physical exam findings and initial radiographic and laboratory data in the context of comorbid conditions.  The patient requires inpatient status due to high intensity of service, high risk of further deterioration and high frequency of surveillance required.  I certify that at the time of admission, it is my clinical judgment that the patient will require inpatient hospital care extending more than 2 midnights.                            Dispo: The patient is from: Home              Anticipated d/c is to: Home              Patient currently is not medically stable to d/c.              Difficult to place patient: No  Christel Mormon M.D on 12/01/2022 at 1:42 AM  [facility]   From 7 PM-7 AM, contact night-coverage [email or web address]  CC: Primary care physician;  Idelle Crouch, MD

## 2022-12-01 NOTE — Progress Notes (Addendum)
Progress Note   Patient: Joe Dillon OJJ:[id number] DOB: 1956/10/21 DOA: 11/30/2022     1 DOS: the patient was seen and examined on 12/01/2022   Brief hospital course: Taken from H&P.  Joe Dillon is a 66 y.o. male with medical history significant for essential hypertension and COPD, who presented to the emergency room with onset of worsening dyspnea over the last couple days with associated cough productive of green sputum as well as occasional wheezing, fever and chills with a Tmax of 100.1 at home.  He denies any nausea or vomiting or diarrhea.  No loss of smell or taste.  He continues to smoke 1 pack of cigarettes per day.  ED Course: When the patient came to the ER, BP was 172/95 with otherwise normal vital signs.  Labs revealed hyponatremia 133 and hypochloremia of 97 with a blood glucose of 132 and calcium of 8.6.  High sensitive troponin I was 103 and later 107.  Procalcitonin was less than 0.1 and lactic acid 1.  CBC showed WBC of 8.9 and hemoglobin 12.9 hematocrit 38.7 with platelets of 161.  Influenza A PCR came back positive. UA was unremarkable.  2 blood cultures were drawn. EKG  showed sinus tachycardia with a rate of 111 Imaging: Two-view chest x-ray showed no acute cardiopulmonary disease. Patient was started on Tamiflu.  1/26: Afebrile with mildly elevated blood pressure at 156/89.  Requiring 2 L of oxygen with no baseline oxygen use.  Labs mostly unremarkable and stable.  Preliminary blood cultures negative in 12 hours.  Troponin peaked at 107 and then trending down, most likely secondary to mismatch demand and supply.  Procalcitonin remain negative.    Assessment and Plan: * Influenza A New oxygen requirement, chest x-ray without any acute abnormality and procalcitonin negative.  Started on Tamiflu -Continue with Tamiflu -Continue with supportive care  SIRS (systemic inflammatory response syndrome) ([place]) - This like secondary to #1. - Management as above. -  Will follow blood cultures. - Procalcitonin and lactic acid were normal.  Elevated troponin I level Mildly elevated troponin with a downward trend, most likely secondary to mismatch demand and supply with respiratory symptoms.  No chest pain  Essential hypertension - We will continue  his antihypertensives.  Acute exacerbation of chronic obstructive pulmonary disease (COPD) ([place]) Secondary to influenza A.  Requiring 2 L of oxygen with no baseline oxygen use, no documented hypoxia. -Continue with bronchodilators -Continue with steroid -Wean from oxygen as tolerated  H/O Salmonella infection No acute concern    Subjective: Patient was seen and examined today.  Continues to have significant cough and congestion.  Physical Exam: Vitals:   12/01/22 0030 12/01/22 0046 12/01/22 0600 12/01/22 0755  BP: (!) 143/97 (!) 153/81  (!) 156/89  Pulse: 97 95  96  Resp: '11 20  16  '$ Temp:  98.6 F (37 C)  98.1 F (36.7 C)  TempSrc:      SpO2: 93% 93% 95% 96%  Weight:      Height:       General.  Well-developed gentleman, in no acute distress. Pulmonary.  Scattered wheeze bilaterally, normal respiratory effort. CV.  Regular rate and rhythm, no JVD, rub or murmur. Abdomen.  Soft, nontender, nondistended, BS positive. CNS.  Alert and oriented .  No focal neurologic deficit. Extremities.  No edema, no cyanosis, pulses intact and symmetrical. Psychiatry.  Judgment and insight appears normal.   Data Reviewed: Prior data reviewed  Family Communication: Discussed with wife at bedside  Disposition: Status is: Inpatient Remains inpatient appropriate because: Severity of illness  Planned Discharge Destination: Home  DVT prophylaxis.  Lovenox Time spent: 45 minutes  This record has been created using Systems analyst. Errors have been sought and corrected,but may not always be located. Such creation errors do not reflect on the standard of care.   Author: Lorella Nimrod,  MD 12/01/2022 2:25 PM  For on call review [email or web address].

## 2022-12-01 NOTE — Assessment & Plan Note (Addendum)
No acute concern

## 2022-12-01 NOTE — Assessment & Plan Note (Addendum)
Mildly elevated troponin with a downward trend, most likely secondary to mismatch demand and supply with respiratory symptoms.  No chest pain

## 2022-12-01 NOTE — Assessment & Plan Note (Addendum)
New oxygen requirement, chest x-ray without any acute abnormality and procalcitonin negative.  Started on Tamiflu -Continue with Tamiflu -Continue with supportive care

## 2022-12-01 NOTE — Hospital Course (Addendum)
Taken from H&P.  Joe Dillon is a 67 y.o. male with medical history significant for essential hypertension and COPD, who presented to the emergency room with onset of worsening dyspnea over the last couple days with associated cough productive of green sputum as well as occasional wheezing, fever and chills with a Tmax of 100.1 at home.  He denies any nausea or vomiting or diarrhea.  No loss of smell or taste.  He continues to smoke 1 pack of cigarettes per day.  ED Course: When the patient came to the ER, BP was 172/95 with otherwise normal vital signs.  Labs revealed hyponatremia 133 and hypochloremia of 97 with a blood glucose of 132 and calcium of 8.6.  High sensitive troponin I was 103 and later 107.  Procalcitonin was less than 0.1 and lactic acid 1.  CBC showed WBC of 8.9 and hemoglobin 12.9 hematocrit 38.7 with platelets of 161.  Influenza A PCR came back positive. UA was unremarkable.  2 blood cultures were drawn. EKG  showed sinus tachycardia with a rate of 111 Imaging: Two-view chest x-ray showed no acute cardiopulmonary disease. Patient was started on Tamiflu.  1/26: Afebrile with mildly elevated blood pressure at 156/89.  Requiring 2 L of oxygen with no baseline oxygen use.  Labs mostly unremarkable and stable.  Preliminary blood cultures negative in 12 hours.  Troponin peaked at 107 and then trending down, most likely secondary to mismatch demand and supply.  Procalcitonin remain negative.  1/27: Clinically seems improving, able to wean off to room air at rest but desaturating to 85% with ambulation requiring up to 2 to 3 L of oxygen.  Added Zithromax for its lung anti-inflammatory effect.   1/28: Patient remained stable and able to wean back to room air while resting.  Continued to desaturate up to 87% with ambulation requiring 2 L of oxygen.  Patient has underlying COPD and might need oxygen for little bit longer period of time.  He would like to go home, he is being discharged on 2  L of oxygen to use during ambulation.  He is also being given 3 days of steroid, Zithromax and Tamiflu to complete the course.  He will continue with his home inhalers and need to have a close follow-up with his providers for further recommendations.

## 2022-12-01 NOTE — Assessment & Plan Note (Signed)
-  We will continue his antihypertensives. 

## 2022-12-02 DIAGNOSIS — J101 Influenza due to other identified influenza virus with other respiratory manifestations: Secondary | ICD-10-CM | POA: Diagnosis not present

## 2022-12-02 DIAGNOSIS — R6510 Systemic inflammatory response syndrome (SIRS) of non-infectious origin without acute organ dysfunction: Secondary | ICD-10-CM | POA: Diagnosis not present

## 2022-12-02 DIAGNOSIS — R7989 Other specified abnormal findings of blood chemistry: Secondary | ICD-10-CM | POA: Diagnosis not present

## 2022-12-02 DIAGNOSIS — I10 Essential (primary) hypertension: Secondary | ICD-10-CM | POA: Diagnosis not present

## 2022-12-02 LAB — PROCALCITONIN: Procalcitonin: <0.1 ng/mL

## 2022-12-02 MED ORDER — AZITHROMYCIN 250 MG PO TABS
250.0000 mg | ORAL_TABLET | Freq: Every day | ORAL | Status: DC
  Administered 2022-12-03: 250 mg via ORAL
  Filled 2022-12-02: qty 1

## 2022-12-02 MED ORDER — AZITHROMYCIN 500 MG PO TABS
500.0000 mg | ORAL_TABLET | Freq: Every day | ORAL | Status: AC
  Administered 2022-12-02: 500 mg via ORAL
  Filled 2022-12-02: qty 1

## 2022-12-02 NOTE — Plan of Care (Signed)

## 2022-12-02 NOTE — Progress Notes (Signed)
Progress Note   Patient: Joe Dillon:[id number] DOB: [birth year] DOA: 11/30/2022     2 DOS: the patient was seen and examined on 12/02/2022   Brief hospital course: Taken from H&P.  Joe Dillon is a 67 y.o. male with medical history significant for essential hypertension and COPD, who presented to the emergency room with onset of worsening dyspnea over the last couple days with associated cough productive of green sputum as well as occasional wheezing, fever and chills with a Tmax of 100.1 at home.  He denies any nausea or vomiting or diarrhea.  No loss of smell or taste.  He continues to smoke 1 pack of cigarettes per day.  ED Course: When the patient came to the ER, BP was 172/95 with otherwise normal vital signs.  Labs revealed hyponatremia 133 and hypochloremia of 97 with a blood glucose of 132 and calcium of 8.6.  High sensitive troponin I was 103 and later 107.  Procalcitonin was less than 0.1 and lactic acid 1.  CBC showed WBC of 8.9 and hemoglobin 12.9 hematocrit 38.7 with platelets of 161.  Influenza A PCR came back positive. UA was unremarkable.  2 blood cultures were drawn. EKG  showed sinus tachycardia with a rate of 111 Imaging: Two-view chest x-ray showed no acute cardiopulmonary disease. Patient was started on Tamiflu.  1/26: Afebrile with mildly elevated blood pressure at 156/89.  Requiring 2 L of oxygen with no baseline oxygen use.  Labs mostly unremarkable and stable.  Preliminary blood cultures negative in 12 hours.  Troponin peaked at 107 and then trending down, most likely secondary to mismatch demand and supply.  Procalcitonin remain negative.  1/27: Clinically seems improving, able to wean off to room air at rest but desaturating to 85% with ambulation requiring up to 2 to 3 L of oxygen.  Added Zithromax for its lung anti-inflammatory effect.  Will give her another day.   Assessment and Plan: * Influenza A New oxygen requirement, chest x-ray without any acute  abnormality and procalcitonin negative.  Started on Tamiflu -Continue with Tamiflu -Continue with supportive care  SIRS (systemic inflammatory response syndrome) ([place]) - This like secondary to #1. - Management as above. - Will follow blood cultures. - Procalcitonin and lactic acid were normal.  Elevated troponin I level Mildly elevated troponin with a downward trend, most likely secondary to mismatch demand and supply with respiratory symptoms.  No chest pain  Essential hypertension - We will continue  his antihypertensives.  Acute exacerbation of chronic obstructive pulmonary disease (COPD) ([place]) Secondary to influenza A.  Requiring 2 L of oxygen with no baseline oxygen use, no documented hypoxia. -Continue with bronchodilators -Continue with steroid -Wean from oxygen as tolerated  H/O Salmonella infection No acute concern    Subjective: Patient was thinking that he is getting better when seen during morning rounds.  No baseline oxygen use.  Physical Exam: Vitals:   12/01/22 1500 12/01/22 2357 12/02/22 0736 12/02/22 1505  BP: (!) 148/83 132/83 (!) 153/83 138/77  Pulse: 94 73 74 85  Resp: '16 20 16 17  '$ Temp: 98.2 F (36.8 C) 98.8 F (37.1 C) 98.1 F (36.7 C) 98.2 F (36.8 C)  TempSrc:      SpO2: 95% 97% 98% 94%  Weight:      Height:       General.  Well-developed gentleman, in no acute distress. Pulmonary.  Lungs clear bilaterally, normal respiratory effort. CV.  Regular rate and rhythm, no JVD, rub or murmur.  Abdomen.  Soft, nontender, nondistended, BS positive. CNS.  Alert and oriented .  No focal neurologic deficit. Extremities.  No edema, no cyanosis, pulses intact and symmetrical. Psychiatry.  Judgment and insight appears normal.   Data Reviewed: Prior data reviewed  Family Communication: Discussed with patient  Disposition: Status is: Inpatient Remains inpatient appropriate because: Severity of illness  Planned Discharge Destination: Home  DVT  prophylaxis.  Lovenox Time spent: 40 minutes  This record has been created using Systems analyst. Errors have been sought and corrected,but may not always be located. Such creation errors do not reflect on the standard of care.   Author: Lorella Nimrod, MD 12/02/2022 3:37 PM  For on call review [email or web address].

## 2022-12-03 DIAGNOSIS — R6510 Systemic inflammatory response syndrome (SIRS) of non-infectious origin without acute organ dysfunction: Secondary | ICD-10-CM | POA: Diagnosis not present

## 2022-12-03 DIAGNOSIS — I10 Essential (primary) hypertension: Secondary | ICD-10-CM | POA: Diagnosis not present

## 2022-12-03 DIAGNOSIS — J101 Influenza due to other identified influenza virus with other respiratory manifestations: Secondary | ICD-10-CM | POA: Diagnosis not present

## 2022-12-03 DIAGNOSIS — R7989 Other specified abnormal findings of blood chemistry: Secondary | ICD-10-CM | POA: Diagnosis not present

## 2022-12-03 MED ORDER — OSELTAMIVIR PHOSPHATE 75 MG PO CAPS: 75.0000 mg | ORAL_CAPSULE | Freq: Two times a day (BID) | ORAL | 0 refills | Status: AC

## 2022-12-03 MED ORDER — AZITHROMYCIN 250 MG PO TABS: ORAL_TABLET | ORAL | 0 refills | Status: DC

## 2022-12-03 MED ORDER — PREDNISONE 20 MG PO TABS: 20.0000 mg | ORAL_TABLET | Freq: Every day | ORAL | 0 refills | Status: AC

## 2022-12-03 MED ORDER — ALBUTEROL SULFATE (2.5 MG/3ML) 0.083% IN NEBU: 2.5000 mg | INHALATION_SOLUTION | Freq: Four times a day (QID) | RESPIRATORY_TRACT | 12 refills | Status: AC | PRN

## 2022-12-03 NOTE — Discharge Summary (Signed)
Physician Discharge Summary   Patient: Joe Dillon MRN: [id number] DOB: 04-Dec-1955  Admit date:     11/30/2022  Discharge date: 12/03/22  Discharge Physician: Joe Dillon   PCP: Joe Crouch, MD   Recommendations at discharge:  Please obtain CBC and BMP in 1 week Patient is being discharged on oxygen to be used during ambulation, please ensure that he continues to need or advised when to discontinue. Please ensure he completed a course of Tamiflu, Zithromax and prednisone Follow-up with primary care provider within a week  Discharge Diagnoses: Principal Problem:   Influenza A Active Problems:   SIRS (systemic inflammatory response syndrome) (HCC)   Elevated troponin I level   Essential hypertension   Acute exacerbation of chronic obstructive pulmonary disease (COPD) ([place])   H/O Salmonella infection   Hospital Course: Taken from H&P.  Joe Dillon is a 67 y.o. male with medical history significant for essential hypertension and COPD, who presented to the emergency room with onset of worsening dyspnea over the last couple days with associated cough productive of green sputum as well as occasional wheezing, fever and chills with a Tmax of 100.1 at home.  He denies any nausea or vomiting or diarrhea.  No loss of smell or taste.  He continues to smoke 1 pack of cigarettes per day.  ED Course: When the patient came to the ER, BP was 172/95 with otherwise normal vital signs.  Labs revealed hyponatremia 133 and hypochloremia of 97 with a blood glucose of 132 and calcium of 8.6.  High sensitive troponin I was 103 and later 107.  Procalcitonin was less than 0.1 and lactic acid 1.  CBC showed WBC of 8.9 and hemoglobin 12.9 hematocrit 38.7 with platelets of 161.  Influenza A PCR came back positive. UA was unremarkable.  2 blood cultures were drawn. EKG  showed sinus tachycardia with a rate of 111 Imaging: Two-view chest x-ray showed no acute cardiopulmonary disease. Patient was  started on Tamiflu.  1/26: Afebrile with mildly elevated blood pressure at 156/89.  Requiring 2 L of oxygen with no baseline oxygen use.  Labs mostly unremarkable and stable.  Preliminary blood cultures negative in 12 hours.  Troponin peaked at 107 and then trending down, most likely secondary to mismatch demand and supply.  Procalcitonin remain negative.  1/27: Clinically seems improving, able to wean off to room air at rest but desaturating to 85% with ambulation requiring up to 2 to 3 L of oxygen.  Added Zithromax for its lung anti-inflammatory effect.   1/28: Patient remained stable and able to wean back to room air while resting.  Continued to desaturate up to 87% with ambulation requiring 2 L of oxygen.  Patient has underlying COPD and might need oxygen for little bit longer period of time.  He would like to go home, he is being discharged on 2 L of oxygen to use during ambulation.  He is also being given 3 days of steroid, Zithromax and Tamiflu to complete the course.  He will continue with his home inhalers and need to have a close follow-up with his providers for further recommendations.  Assessment and Plan: * Influenza A New oxygen requirement, chest x-ray without any acute abnormality and procalcitonin negative.  Started on Tamiflu -Continue with Tamiflu -Continue with supportive care  SIRS (systemic inflammatory response syndrome) ([place]) - This like secondary to #1. - Management as above. - Will follow blood cultures. - Procalcitonin and lactic acid were normal.  Elevated troponin  I level Mildly elevated troponin with a downward trend, most likely secondary to mismatch demand and supply with respiratory symptoms.  No chest pain  Essential hypertension - We will continue  his antihypertensives.  Acute exacerbation of chronic obstructive pulmonary disease (COPD) ([place]) Secondary to influenza A.  Requiring 2 L of oxygen with no baseline oxygen use, no documented hypoxia. -Continue  with bronchodilators -Continue with steroid -Wean from oxygen as tolerated  H/O Salmonella infection No acute concern   Consultants: None Procedures performed: None Disposition: Home Diet recommendation:  Discharge Diet Orders (From admission, onward)     Start     Ordered   12/03/22 0000  Diet - low sodium heart healthy        12/03/22 1157           Cardiac diet DISCHARGE MEDICATION: Allergies as of 12/03/2022   No Known Allergies      Medication List     STOP taking these medications    clotrimazole 10 MG troche Commonly known as: MYCELEX   lisinopril 40 MG tablet Commonly known as: ZESTRIL       TAKE these medications    albuterol 108 (90 Base) MCG/ACT inhaler Commonly known as: VENTOLIN HFA Inhale 2 puffs into the lungs every 6 (six) hours as needed for wheezing or shortness of breath. What changed: Another medication with the same name was added. Make sure you understand how and when to take each.   albuterol (2.5 MG/3ML) 0.083% nebulizer solution Commonly known as: PROVENTIL Inhale 3 mLs (2.5 mg total) into the lungs every 6 (six) hours as needed for wheezing or shortness of breath. What changed: You were already taking a medication with the same name, and this prescription was added. Make sure you understand how and when to take each.   amLODipine 10 MG tablet Commonly known as: NORVASC Take 1 tablet (10 mg total) by mouth daily.   azithromycin 250 MG tablet Commonly known as: ZITHROMAX 1 tablet daily for next 3 days Start taking on: December 04, 2022   esomeprazole 20 MG capsule Commonly known as: NEXIUM Take 20 mg by mouth daily at 12 noon.   meloxicam 15 MG tablet Commonly known as: MOBIC Take 15 mg by mouth daily.   multivitamin tablet Take 1 tablet by mouth daily.   oseltamivir 75 MG capsule Commonly known as: TAMIFLU Take 1 capsule (75 mg total) by mouth 2 (two) times daily for 3 days.   predniSONE 20 MG tablet Commonly  known as: DELTASONE Take 1 tablet (20 mg total) by mouth daily with breakfast for 3 days.   Trelegy Ellipta 100-62.5-25 MCG/ACT Aepb Generic drug: Fluticasone-Umeclidin-Vilant INHALE ONE PUFF BY MOUTH DAILY What changed: Another medication with the same name was removed. Continue taking this medication, and follow the directions you see here.   vitamin C 1000 MG tablet Take 1,000 mg by mouth daily.               Durable Medical Equipment  (From admission, onward)           Start     Ordered   12/03/22 1146  For home use only DME oxygen  Once       Question Answer Comment  Length of Need 6 Months   Mode or (Route) Nasal cannula   Liters per Minute 2   Frequency Continuous (stationary and portable oxygen unit needed)   Oxygen conserving device Yes   Oxygen delivery system Gas  12/03/22 1146   12/03/22 1146  For home use only DME Nebulizer machine  Once       Question Answer Comment  Patient needs a nebulizer to treat with the following condition COPD with exacerbation ([place])   Length of Need Lifetime      12/03/22 1146            Follow-up Information     Joe Crouch, MD. Schedule an appointment as soon as possible for a visit in 1 week(s).   Specialty: Internal Medicine Contact information: [place] [address] [phone]                Discharge Exam: Danley Danker Weights   11/30/22 2151  Weight: 83.9 kg   General.     In no acute distress. Pulmonary.  Lungs clear bilaterally, normal respiratory effort. CV.  Regular rate and rhythm, no JVD, rub or murmur. Abdomen.  Soft, nontender, nondistended, BS positive. CNS.  Alert and oriented .  No focal neurologic deficit. Extremities.  No edema, no cyanosis, pulses intact and symmetrical. Psychiatry.  Judgment and insight appears normal.   Condition at discharge: stable  The results of significant diagnostics from this hospitalization (including  imaging, microbiology, ancillary and laboratory) are listed below for reference.   Imaging Studies: CT Angio Chest Pulmonary Embolism (PE) W or WO Contrast  Result Date: 12/01/2022 CLINICAL DATA:  Pulmonary embolism (PE) suspected, low to intermediate prob, neg D-dimer. Dyspnea, wheezing EXAM: CT ANGIOGRAPHY CHEST WITH CONTRAST TECHNIQUE: Multidetector CT imaging of the chest was performed using the standard protocol during bolus administration of intravenous contrast. Multiplanar CT image reconstructions and MIPs were obtained to evaluate the vascular anatomy. RADIATION DOSE REDUCTION: This exam was performed according to the departmental dose-optimization program which includes automated exposure control, adjustment of the mA and/or kV according to patient size and/or use of iterative reconstruction technique. CONTRAST:  82m OMNIPAQUE IOHEXOL 350 MG/ML SOLN COMPARISON:  None Available. FINDINGS: Cardiovascular: Adequate opacification of the pulmonary arterial tree. No intraluminal filling defect identified to suggest acute pulmonary embolism. Central pulmonary arteries are of normal caliber. No significant coronary artery calcification. Cardiac size within normal limits. No pericardial effusion. No significant atherosclerotic calcification within the thoracic aorta. No aortic aneurysm. Mediastinum/Nodes: There is shotty mediastinal and bilateral hilar adenopathy which is nonspecific but may be reactive or inflammatory in nature. No frankly pathologic thoracic adenopathy. Visualized thyroid is unremarkable. Esophagus is unremarkable. Lungs/Pleura: Moderate emphysema. Diffuse bronchial wall thickening in keeping with airway inflammation no focal pulmonary nodules. There is sparse ground-glass pulmonary infiltrate noted within the subpleural right lower lobe, possibly infectious or inflammatory in nature. No pneumothorax or pleural effusion. No central obstructing lesion. Upper Abdomen: No acute abnormality.  Musculoskeletal: No acute bone abnormality. No lytic or blastic bone lesion. Review of the MIP images confirms the above findings. IMPRESSION: 1. No pulmonary embolism. 2. Moderate emphysema. 3. Diffuse bronchial wall thickening in keeping with airway inflammation. 4. Sparse ground-glass pulmonary infiltrate within the subpleural right lower lobe, possibly infectious or inflammatory in nature. 5. Shotty mediastinal and bilateral hilar adenopathy, nonspecific but possibly reactive. Emphysema (ICD10-J43.9). Electronically Signed   By: AFidela SalisburyM.D.   On: 12/01/2022 02:57   DG Chest 2 View  Result Date: 11/30/2022 CLINICAL DATA:  SOB EXAM: CHEST - 2 VIEW COMPARISON:  Chest x-ray 04/12/2018 FINDINGS: The heart and mediastinal contours are within normal limits. No focal consolidation. No pulmonary edema. No pleural effusion. No pneumothorax.  No acute osseous abnormality. Plate and screw fixation of the left clavicle. IMPRESSION: No active cardiopulmonary disease. Electronically Signed   By: Iven Finn M.D.   On: 11/30/2022 22:20    Microbiology: Results for orders placed or performed during the hospital encounter of 11/30/22  Resp panel by RT-PCR (RSV, Flu A&B, Covid) Anterior Nasal Swab     Status: Abnormal   Collection Time: 11/30/22 10:02 PM   Specimen: Anterior Nasal Swab  Result Value Ref Range Status   SARS Coronavirus 2 by RT PCR NEGATIVE NEGATIVE Final    Comment: (NOTE) SARS-CoV-2 target nucleic acids are NOT DETECTED.  The SARS-CoV-2 RNA is generally detectable in upper respiratory specimens during the acute phase of infection. The lowest concentration of SARS-CoV-2 viral copies this assay can detect is 138 copies/mL. A negative result does not preclude SARS-Cov-2 infection and should not be used as the sole basis for treatment or other patient management decisions. A negative result may occur with  improper specimen collection/handling, submission of specimen other than  nasopharyngeal swab, presence of viral mutation(s) within the areas targeted by this assay, and inadequate number of viral copies(<138 copies/mL). A negative result must be combined with clinical observations, patient history, and epidemiological information. The expected result is Negative.  Fact Sheet for Patients:  EntrepreneurPulse.com.au  Fact Sheet for Healthcare Providers:  IncredibleEmployment.be  This test is no t yet approved or cleared by the [place] FDA and  has been authorized for detection and/or diagnosis of SARS-CoV-2 by FDA under an Emergency Use Authorization (EUA). This EUA will remain  in effect (meaning this test can be used) for the duration of the COVID-19 declaration under Section 564(b)(1) of the Act, 21 U.S.C.section 360bbb-3(b)(1), unless the authorization is terminated  or revoked sooner.       Influenza A by PCR POSITIVE (A) NEGATIVE Final   Influenza B by PCR NEGATIVE NEGATIVE Final    Comment: (NOTE) The Xpert Xpress SARS-CoV-2/FLU/RSV plus assay is intended as an aid in the diagnosis of influenza from Nasopharyngeal swab specimens and should not be used as a sole basis for treatment. Nasal washings and aspirates are unacceptable for Xpert Xpress SARS-CoV-2/FLU/RSV testing.  Fact Sheet for Patients: EntrepreneurPulse.com.au  Fact Sheet for Healthcare Providers: IncredibleEmployment.be  This test is not yet approved or cleared by the [place] FDA and has been authorized for detection and/or diagnosis of SARS-CoV-2 by FDA under an Emergency Use Authorization (EUA). This EUA will remain in effect (meaning this test can be used) for the duration of the COVID-19 declaration under Section 564(b)(1) of the Act, 21 U.S.C. section 360bbb-3(b)(1), unless the authorization is terminated or revoked.     Resp Syncytial Virus by PCR NEGATIVE NEGATIVE Final    Comment:  (NOTE) Fact Sheet for Patients: EntrepreneurPulse.com.au  Fact Sheet for Healthcare Providers: IncredibleEmployment.be  This test is not yet approved or cleared by the [place] FDA and has been authorized for detection and/or diagnosis of SARS-CoV-2 by FDA under an Emergency Use Authorization (EUA). This EUA will remain in effect (meaning this test can be used) for the duration of the COVID-19 declaration under Section 564(b)(1) of the Act, 21 U.S.C. section 360bbb-3(b)(1), unless the authorization is terminated or revoked.  Performed at [facility], [address] [place], [place] [address]   Blood Culture (routine x 2)     Status: None (Preliminary result)   Collection Time: 11/30/22 11:45 PM   Specimen: BLOOD  Result Value Ref Range Status  Specimen Description BLOOD LEFT AC  Final   Special Requests   Final    BOTTLES DRAWN AEROBIC ONLY Blood Culture results may not be optimal due to an inadequate volume of blood received in culture bottles   Culture   Final    NO GROWTH 3 DAYS Performed at [facility], [address], [place], [place] [address]    Report Status PENDING  Incomplete  Blood Culture (routine x 2)     Status: None (Preliminary result)   Collection Time: 11/30/22 11:55 PM   Specimen: BLOOD  Result Value Ref Range Status   Specimen Description BLOOD RIGHT [facility]  Final   Special Requests   Final    BOTTLES DRAWN AEROBIC ONLY Blood Culture results may not be optimal due to an inadequate volume of blood received in culture bottles   Culture   Final    NO GROWTH 3 DAYS Performed at [facility], [address] [place], [place] [address]    Report Status PENDING  Incomplete    Labs: CBC: Recent Labs  Lab 11/30/22 2154 12/01/22 0135  WBC 8.9 6.5  HGB 12.9* 12.1*  HCT 38.7* 36.2*  MCV 92.8 93.1  PLT 161 097   Basic Metabolic Panel: Recent Labs  Lab 11/30/22 2154 12/01/22 0135   NA 133* 133*  K 3.8 3.7  CL 97* 97*  CO2 28 26  GLUCOSE 132* 99  BUN 21 20  CREATININE 0.94 1.00  CALCIUM 8.6* 8.2*   Liver Function Tests: No results for input(s): "AST", "ALT", "ALKPHOS", "BILITOT", "PROT", "ALBUMIN" in the last 168 hours. CBG: No results for input(s): "GLUCAP" in the last 168 hours.  Discharge time spent: greater than 30 minutes.  This record has been created using Systems analyst. Errors have been sought and corrected,but may not always be located. Such creation errors do not reflect on the standard of care.   Signed: Lorella Nimrod, MD [facility] 12/03/2022

## 2022-12-03 NOTE — Progress Notes (Signed)
SATURATION QUALIFICATIONS: (This note is used to comply with regulatory documentation for home oxygen)  Patient Saturations on Room Air at Rest = 93%  Patient Saturations on Room Air while Ambulating = 87%  Patient Saturations on 3 Liters of oxygen while Ambulating = 93%  Please briefly explain why patient needs home oxygen: patient oxygen desats to 87% on   RA while ambulating,when oxygen was administered at 2L/m sats increased to 90%,   when oxygen was administered at 3L/m,sats increased to 93% and when oxygen was   administered at 4L/m,sats increased to 95% respectively.

## 2022-12-03 NOTE — TOC Initial Note (Signed)
Transition of Care [facility]) - Initial/Assessment Note    Patient Details  Name: Joe Dillon MRN: [id number] Date of Birth: [birth year]  Transition of [facility] CM/SW Contact:    Magnus Ivan, LCSW Phone Number: 12/03/2022, 11:56 AM  Clinical Narrative:                 Patient to DC home today and per MD needs nebulizer and oxygen.  Spoke to patient. Patient lives with his wife. Patient is independent at baseline, drives himself places and works.  PCP is Dr. Doy Hutching. [facility]. Patient is agreeable to nebulizer and o2 being ordered for home - referral made to [facility] with [facility] and she is aware of DC home today pending delivery.   Patient states his wife is supportive and will help him at home as needed, he declined additional TOC needs prior to DC.   Expected Discharge Plan: Home/Self Care Barriers to Discharge: Barriers Resolved   Patient Goals and CMS Choice Patient states their goals for this hospitalization and ongoing recovery are:: home with home health CMS Medicare.gov Compare Post Acute Care list provided to:: Patient Choice offered to / list presented to : Patient      Expected Discharge Plan and Services       Living arrangements for the past 2 months: Single Family Home                 DME Arranged: Nebulizer machine, Oxygen DME Agency: [facility] Date DME Agency Contacted: 12/03/22   Representative spoke with at DME Agency: Delana Meyer            Prior Living Arrangements/Services Living arrangements for the past 2 months: [place] Lives with:: Spouse Patient language and need for interpreter reviewed:: Yes Do you feel safe going back to the place where you live?: Yes      Need for Family Participation in Patient Care: Yes (Comment) Care giver support system in place?: Yes (comment)   Criminal Activity/Legal Involvement Pertinent to Current Situation/Hospitalization: No - Comment as needed  Activities of Daily Living Home  Assistive Devices/Equipment: None ADL Screening (condition at time of admission) Patient's cognitive ability adequate to safely complete daily activities?: No Is the patient deaf or have difficulty hearing?: No Does the patient have difficulty seeing, even when wearing glasses/contacts?: No Does the patient have difficulty concentrating, remembering, or making decisions?: No Patient able to express need for assistance with ADLs?: No Does the patient have difficulty dressing or bathing?: No Independently performs ADLs?: Yes (appropriate for developmental age) Does the patient have difficulty walking or climbing stairs?: No Weakness of Legs: None Weakness of Arms/Hands: None  Permission Sought/Granted Permission sought to share information with : Facility Art therapist granted to share information with : Yes, Verbal Permission Granted     Permission granted to share info w AGENCY: [employer]        Emotional Assessment       Orientation: : Oriented to Situation, Oriented to Self, Oriented to Place, Oriented to  Time Alcohol / Substance Use: Not Applicable Psych Involvement: No (comment)  Admission diagnosis:  Plantar fasciitis [M72.2] Influenza A [J10.1] Elevated troponin [R79.89] Acute sepsis [facility]) [A41.9] Patient Active Problem List   Diagnosis Date Noted   SIRS (systemic inflammatory response syndrome) ([place]) 12/01/2022   Elevated troponin I level 12/01/2022   Essential hypertension 12/01/2022   Acute exacerbation of chronic obstructive pulmonary disease (COPD) ([place]) 12/01/2022   H/O Salmonella infection 12/01/2022  Influenza A 11/30/2022   Localized primary osteoarthrosis of carpometacarpal joint of right wrist 11/02/2016   Mixed simple and mucopurulent chronic bronchitis ([place]) 10/28/2015   Chronic cough 05/25/2015   Backhand tennis elbow 05/25/2015   Snores 05/25/2015   ST segment depression 05/25/2015   Synovitis and tenosynovitis 05/25/2015    Tenosynovitis of hand 05/25/2015   Irregular cardiac rhythm 12/16/2014   Abnormal ECG 09/11/2014   Benign essential HTN 09/11/2014   Chest pain 09/11/2014   Fatigue 09/11/2014   Combined fat and carbohydrate induced hyperlipemia 09/11/2014   Breathlessness on exertion 09/11/2014   Current tobacco use 09/11/2014   Thrombosed hemorrhoids 05/16/2010   Blood in feces 02/24/2010   PCP:  Idelle Crouch, MD Pharmacy:   [facility] [address], [place] [address] Phone: [phone] Fax: [place], [place] [address] [place] [address] Phone: [phone] Fax: [place] [id number], [place] [address] Phone: [phone] Fax: [phone]     Social Determinants of Health (SDOH) Social History: SDOH Screenings   Food Insecurity: No Food Insecurity (12/01/2022)  Housing: Low Risk  (12/01/2022)  Transportation Needs: No Transportation Needs (12/01/2022)  Utilities: Not At Risk (12/01/2022)  Alcohol Screen: Low Risk  (09/20/2021)  Depression (PHQ2-9): Low Risk  (09/20/2021)  Financial Resource Strain: Low Risk  (12/22/2020)  Physical Activity: Inactive (12/22/2020)  Social Connections: Moderately Integrated (12/22/2020)  Stress: No Stress Concern Present (12/22/2020)  Tobacco Use: High Risk (11/30/2022)   SDOH Interventions:     Readmission Risk Interventions     No data to display

## 2022-12-03 NOTE — Plan of Care (Signed)
Patient discharged per MD orders at this time.All dc instructions,education and medications reviewed with the patient.Pt expressed understanding and will comply with dc instructions.f/u appointments was also communicated to the patient.no verbal c/o or any ssx of distress.Pt was discharged home with self-care per order.Pt was transported home by spouse in a privately owned vehicle.

## 2022-12-05 LAB — CULTURE, BLOOD (ROUTINE X 2)
Culture: NO GROWTH
Culture: NO GROWTH

## 2022-12-11 DIAGNOSIS — J431 Panlobular emphysema: Secondary | ICD-10-CM | POA: Diagnosis not present

## 2022-12-11 DIAGNOSIS — Z720 Tobacco use: Secondary | ICD-10-CM | POA: Diagnosis not present

## 2022-12-11 DIAGNOSIS — Z09 Encounter for follow-up examination after completed treatment for conditions other than malignant neoplasm: Secondary | ICD-10-CM | POA: Diagnosis not present

## 2022-12-11 DIAGNOSIS — I10 Essential (primary) hypertension: Secondary | ICD-10-CM | POA: Diagnosis not present

## 2022-12-27 DIAGNOSIS — J449 Chronic obstructive pulmonary disease, unspecified: Secondary | ICD-10-CM | POA: Diagnosis not present

## 2022-12-27 DIAGNOSIS — I10 Essential (primary) hypertension: Secondary | ICD-10-CM | POA: Diagnosis not present

## 2022-12-27 DIAGNOSIS — Z79899 Other long term (current) drug therapy: Secondary | ICD-10-CM | POA: Diagnosis not present

## 2022-12-27 DIAGNOSIS — Z0000 Encounter for general adult medical examination without abnormal findings: Secondary | ICD-10-CM | POA: Diagnosis not present

## 2022-12-27 DIAGNOSIS — J431 Panlobular emphysema: Secondary | ICD-10-CM | POA: Diagnosis not present

## 2022-12-27 DIAGNOSIS — E785 Hyperlipidemia, unspecified: Secondary | ICD-10-CM | POA: Diagnosis not present

## 2022-12-27 DIAGNOSIS — Z125 Encounter for screening for malignant neoplasm of prostate: Secondary | ICD-10-CM | POA: Diagnosis not present

## 2022-12-27 DIAGNOSIS — R69 Illness, unspecified: Secondary | ICD-10-CM | POA: Diagnosis not present

## 2023-01-11 DIAGNOSIS — J439 Emphysema, unspecified: Secondary | ICD-10-CM | POA: Diagnosis not present

## 2023-01-11 DIAGNOSIS — J449 Chronic obstructive pulmonary disease, unspecified: Secondary | ICD-10-CM | POA: Diagnosis not present

## 2023-01-16 DIAGNOSIS — M170 Bilateral primary osteoarthritis of knee: Secondary | ICD-10-CM | POA: Diagnosis not present

## 2023-01-16 DIAGNOSIS — M25562 Pain in left knee: Secondary | ICD-10-CM | POA: Diagnosis not present

## 2023-01-16 DIAGNOSIS — M25561 Pain in right knee: Secondary | ICD-10-CM | POA: Diagnosis not present

## 2023-01-29 DIAGNOSIS — Z79899 Other long term (current) drug therapy: Secondary | ICD-10-CM | POA: Diagnosis not present

## 2023-02-23 DIAGNOSIS — B370 Candidal stomatitis: Secondary | ICD-10-CM | POA: Diagnosis not present

## 2023-02-23 DIAGNOSIS — J439 Emphysema, unspecified: Secondary | ICD-10-CM | POA: Diagnosis not present

## 2023-02-23 DIAGNOSIS — J441 Chronic obstructive pulmonary disease with (acute) exacerbation: Secondary | ICD-10-CM | POA: Diagnosis not present

## 2023-02-27 ENCOUNTER — Emergency Department
Admission: EM | Admit: 2023-02-27 | Discharge: 2023-02-27 | Disposition: A | Discharge status: Home / Self Care | Payer: Medicare HMO | Attending: Emergency Medicine | Admitting: Emergency Medicine

## 2023-02-27 ENCOUNTER — Encounter: Payer: Self-pay | Admitting: Radiology

## 2023-02-27 ENCOUNTER — Other Ambulatory Visit: Payer: Self-pay

## 2023-02-27 ENCOUNTER — Emergency Department: Payer: Medicare HMO

## 2023-02-27 DIAGNOSIS — E876 Hypokalemia: Secondary | ICD-10-CM | POA: Diagnosis not present

## 2023-02-27 DIAGNOSIS — J441 Chronic obstructive pulmonary disease with (acute) exacerbation: Secondary | ICD-10-CM | POA: Insufficient documentation

## 2023-02-27 DIAGNOSIS — J439 Emphysema, unspecified: Secondary | ICD-10-CM | POA: Diagnosis not present

## 2023-02-27 DIAGNOSIS — Z1152 Encounter for screening for COVID-19: Secondary | ICD-10-CM | POA: Insufficient documentation

## 2023-02-27 DIAGNOSIS — R0602 Shortness of breath: Secondary | ICD-10-CM | POA: Diagnosis not present

## 2023-02-27 DIAGNOSIS — R059 Cough, unspecified: Secondary | ICD-10-CM | POA: Diagnosis not present

## 2023-02-27 LAB — CBC
HCT: 43.5 % (ref 39.0–52.0)
Hemoglobin: 14.3 g/dL (ref 13.0–17.0)
MCH: 30.3 pg (ref 26.0–34.0)
MCHC: 32.9 g/dL (ref 30.0–36.0)
MCV: 92.2 fL (ref 80.0–100.0)
Platelets: 292 10*3/uL (ref 150–400)
RBC: 4.72 MIL/uL (ref 4.22–5.81)
RDW: 11.8 % (ref 11.5–15.5)
WBC: 9.2 10*3/uL (ref 4.0–10.5)
nRBC: 0 % (ref 0.0–0.2)

## 2023-02-27 LAB — BASIC METABOLIC PANEL
Anion gap: 8 (ref 5–15)
BUN: 20 mg/dL (ref 8–23)
CO2: 30 mmol/L (ref 22–32)
Calcium: 8.8 mg/dL — ABNORMAL LOW (ref 8.9–10.3)
Chloride: 96 mmol/L — ABNORMAL LOW (ref 98–111)
Creatinine, Ser: 1.01 mg/dL (ref 0.61–1.24)
GFR, Estimated: >60 mL/min (ref >60)
Glucose, Bld: 105 mg/dL — ABNORMAL HIGH (ref 70–99)
Potassium: 3.3 mmol/L — ABNORMAL LOW (ref 3.5–5.1)
Sodium: 134 mmol/L — ABNORMAL LOW (ref 135–145)

## 2023-02-27 LAB — TROPONIN I (HIGH SENSITIVITY)
Troponin I (High Sensitivity): 6 ng/L (ref <18)
Troponin I (High Sensitivity): 4 ng/L (ref <18)

## 2023-02-27 LAB — BRAIN NATRIURETIC PEPTIDE: B Natriuretic Peptide: 35.4 pg/mL (ref 0.0–100.0)

## 2023-02-27 LAB — SARS CORONAVIRUS 2 BY RT PCR: SARS Coronavirus 2 by RT PCR: NEGATIVE

## 2023-02-27 MED ORDER — PREDNISONE 10 MG (21) PO TBPK: ORAL_TABLET | ORAL | 0 refills | Status: DC

## 2023-02-27 MED ORDER — IPRATROPIUM-ALBUTEROL 0.5-2.5 (3) MG/3ML IN SOLN
3.0000 mL | Freq: Once | RESPIRATORY_TRACT | Status: AC
  Administered 2023-02-27: 3 mL via RESPIRATORY_TRACT
  Filled 2023-02-27: qty 3

## 2023-02-27 MED ORDER — METHYLPREDNISOLONE SODIUM SUCC 125 MG IJ SOLR
125.0000 mg | Freq: Once | INTRAMUSCULAR | Status: AC
  Administered 2023-02-27: 125 mg via INTRAVENOUS
  Filled 2023-02-27: qty 2

## 2023-02-27 MED ORDER — IOHEXOL 350 MG/ML SOLN
75.0000 mL | Freq: Once | INTRAVENOUS | Status: AC | PRN
  Administered 2023-02-27: 75 mL via INTRAVENOUS

## 2023-02-27 NOTE — ED Triage Notes (Signed)
Pt comes with c/o sob, cough and congestion. Pt was given meds at pcp but no relief too much. Pt states he went back to doc last week and was put on presidone. Last dose today. Pt gets winded while ambulating. Pt states fatigue and body aches.

## 2023-02-27 NOTE — ED Provider Notes (Addendum)
[facility] Provider Note    Event Date/Time   First MD Initiated Contact with Patient 02/27/23 1238     (approximate)   History   Shortness of Breath   HPI  Joe Dillon is a 67 y.o. male with history of COPD who comes in with shortness of breath.  On review of records patient was seen on 4/19 treated with doxycycline for 10 days, prednisone taper had previously been on a Z-Pak over a week ago.  However unfortunately patient continues to have symptoms even after completing steroid taper with fatigue, body aches.   Patient reports he had 2 to 3 weeks of exertional shortness of breath.   He denies any cardiac history he denies any chest pain.  He states that this feels different than his normal COPD.  He states that he was not sure if it could be related to his heart.  He states that mostly happens when he is walking but does not report some cough associated with it.  Denies any symptoms at rest.  He reports to be slight improvement with steroids, doxycycline but being worried that there was something else going on such as cancer, pneumonia, something with his heart.  He states that he is trying to get an outpatient follow-up with cardiology.   Physical Exam   Triage Vital Signs: ED Triage Vitals  Enc Vitals Group     BP 02/27/23 1045 (!) 155/82     Pulse Rate 02/27/23 1045 85     Resp 02/27/23 1045 18     Temp 02/27/23 1045 98 F (36.7 C)     Temp src --      SpO2 02/27/23 1045 95 %     Weight --      Height --      Head Circumference --      Peak Flow --      Pain Score 02/27/23 1044 4     Pain Loc --      Pain Edu? --      Excl. in GC? --     Most recent vital signs: Vitals:   02/27/23 1045  BP: (!) 155/82  Pulse: 85  Resp: 18  Temp: 98 F (36.7 C)  SpO2: 95%     General: Awake, no distress.  CV:  Good peripheral perfusion.  Resp:  Normal effort.  Wheezing noted bilaterally. Abd:  No distention.  Soft nontender Other:  No  swelling in legs.  No calf tenderness   ED Results / Procedures / Treatments   Labs (all labs ordered are listed, but only abnormal results are displayed) Labs Reviewed  BASIC METABOLIC PANEL - Abnormal; Notable for the following components:      Result Value   Sodium 134 (*)    Potassium 3.3 (*)    Chloride 96 (*)    Glucose, Bld 105 (*)    Calcium 8.8 (*)    All other components within normal limits  CBC     EKG  My interpretation of EKG:  Normal sinus rate of 78 without any ST elevation or T wave inversions except for aVL, normal intervals  RADIOLOGY I have reviewed the xray personally and interpreted no evidence of any pneumonia    PROCEDURES:  Critical Care performed: No  Procedures   MEDICATIONS ORDERED IN ED: Medications  ipratropium-albuterol (DUONEB) 0.5-2.5 (3) MG/3ML nebulizer solution 3 mL (3 mLs Nebulization Given 02/27/23 1320)  methylPREDNISolone sodium succinate (SOLU-MEDROL) 125 mg/2 mL injection  125 mg (125 mg Intravenous Given 02/27/23 1319)  iohexol (OMNIPAQUE) 350 MG/ML injection 75 mL (75 mLs Intravenous Contrast Given 02/27/23 1341)     IMPRESSION / MDM / ASSESSMENT AND PLAN / ED COURSE  I reviewed the triage vital signs and the nursing notes.   Patient's presentation is most consistent with acute presentation with potential threat to life or bodily function.  Patient comes in with concerns for exertional shortness of breath differential includes ACS, stable angina given he denies any pain at rest or shortness of breath at rest, PE, COPD.  Given he has failed outpatient treatment for COPD we will do a CT PE to make sure is nothing else that could be contributing to shortness of breath such as blood clots, cancer, pneumonia.  Will get cardiac markers to evaluate for ACS.  CBC reassuring BMP slightly low potassium, chloride, sodium  Amblatory sat here is normal.  IMPRESSION: 1. No CT findings for pulmonary embolism. 2. Normal thoracic  aorta. 3. Stable emphysematous changes and pulmonary scarring. No acute pulmonary process or worrisome pulmonary lesions. 4. Stable small scattered mediastinal and hilar lymph nodes, likely related to the underlying lung disease but no mass or overt adenopathy.  Discussed with patient reassuring CT scan.  We discussed the possibility of this being from a COPD versus potentially a stable angina. Heart score of 4. He is got no symptoms at rest no change in symptoms and we discussed admission for further cardiac workup, COPD workup but patient declined at this time.  He reports feeling better after treatments.  He is willing to trialing another course of steroids and on repeat exam his wheezing is much improved.  He reports feeling better.  He is going to follow-up outpatient with cardiology to discuss cardiac CT and have given him Dr. Milta Deiters number for quick follow-up.  He understands that if he starts developing symptoms at rest or worsening symptoms they should return to the ER as I can't predict future heart attack and we are still not sure if this is all COPD or could have some underlying CAD.      FINAL CLINICAL IMPRESSION(S) / ED DIAGNOSES   Final diagnoses:  COPD with acute exacerbation     Rx / DC Orders   ED Discharge Orders          Ordered    predniSONE (STERAPRED UNI-PAK 21 TAB) 10 MG (21) TBPK tablet        02/27/23 1512             Note:  This document was prepared using Dragon voice recognition software and may include unintentional dictation errors.   Concha Se, MD 02/27/23 1515    Concha Se, MD 02/27/23 1516    Concha Se, MD 02/27/23 [phone]

## 2023-02-27 NOTE — Discharge Instructions (Addendum)
We discussed how this could be related to coronary disease versus from your known COPD.  Will trial some more steroids to see if this helps continue taking the doxycycline there is no signs of any pneumonia, lung cancer, PE and no signs of obvious heart attack that would require emergent intervention however if you develop a change in your symptoms that you need to return to the ER for repeat evaluation otherwise I would follow-up with cardiology to discuss cardiac CT to further evaluate for any potential coronary disease given your risk factors.  We discussed admission but at this time you have felt better and felt comfortable with discharge  1. No CT findings for pulmonary embolism.  2. Normal thoracic aorta.  3. Stable emphysematous changes and pulmonary scarring. No acute  pulmonary process or worrisome pulmonary lesions.  4. Stable small scattered mediastinal and hilar lymph nodes, likely  related to the underlying lung disease but no mass or overt  adenopathy.    Emphysema (ICD10-J43.9).

## 2023-03-01 ENCOUNTER — Other Ambulatory Visit
Admission: RE | Admit: 2023-03-01 | Discharge: 2023-03-01 | Disposition: A | Discharge status: Home / Self Care | Payer: Medicare HMO | Source: Ambulatory Visit | Attending: Internal Medicine | Admitting: Internal Medicine

## 2023-03-01 DIAGNOSIS — I10 Essential (primary) hypertension: Secondary | ICD-10-CM | POA: Diagnosis not present

## 2023-03-01 DIAGNOSIS — Z01810 Encounter for preprocedural cardiovascular examination: Secondary | ICD-10-CM | POA: Diagnosis not present

## 2023-03-01 DIAGNOSIS — Z79899 Other long term (current) drug therapy: Secondary | ICD-10-CM | POA: Insufficient documentation

## 2023-03-01 DIAGNOSIS — K219 Gastro-esophageal reflux disease without esophagitis: Secondary | ICD-10-CM | POA: Diagnosis not present

## 2023-03-01 DIAGNOSIS — J441 Chronic obstructive pulmonary disease with (acute) exacerbation: Secondary | ICD-10-CM | POA: Diagnosis not present

## 2023-03-01 DIAGNOSIS — Z720 Tobacco use: Secondary | ICD-10-CM | POA: Diagnosis not present

## 2023-03-01 DIAGNOSIS — E782 Mixed hyperlipidemia: Secondary | ICD-10-CM | POA: Diagnosis not present

## 2023-03-01 DIAGNOSIS — J431 Panlobular emphysema: Secondary | ICD-10-CM | POA: Diagnosis not present

## 2023-03-01 DIAGNOSIS — R0602 Shortness of breath: Secondary | ICD-10-CM | POA: Diagnosis not present

## 2023-03-01 DIAGNOSIS — I5189 Other ill-defined heart diseases: Secondary | ICD-10-CM

## 2023-03-01 DIAGNOSIS — I2089 Other forms of angina pectoris: Secondary | ICD-10-CM | POA: Diagnosis not present

## 2023-03-01 HISTORY — DX: Other ill-defined heart diseases: I51.89

## 2023-03-01 LAB — BRAIN NATRIURETIC PEPTIDE: B Natriuretic Peptide: 37.7 pg/mL (ref 0.0–100.0)

## 2023-03-02 ENCOUNTER — Ambulatory Visit: Admission: RE | Admit: 2023-03-02 | Payer: Medicare HMO | Source: Home / Self Care | Admitting: Internal Medicine

## 2023-03-02 ENCOUNTER — Other Ambulatory Visit (HOSPITAL_COMMUNITY): Payer: Self-pay | Admitting: *Deleted

## 2023-03-02 ENCOUNTER — Encounter: Admission: RE | Payer: Self-pay | Source: Home / Self Care

## 2023-03-02 ENCOUNTER — Other Ambulatory Visit: Payer: Self-pay | Admitting: Internal Medicine

## 2023-03-02 ENCOUNTER — Encounter (HOSPITAL_COMMUNITY): Payer: Self-pay

## 2023-03-02 ENCOUNTER — Telehealth: Payer: Self-pay

## 2023-03-02 DIAGNOSIS — R0602 Shortness of breath: Secondary | ICD-10-CM

## 2023-03-02 DIAGNOSIS — I2089 Other forms of angina pectoris: Secondary | ICD-10-CM

## 2023-03-02 DIAGNOSIS — R079 Chest pain, unspecified: Secondary | ICD-10-CM

## 2023-03-02 SURGERY — RIGHT/LEFT HEART CATH AND CORONARY ANGIOGRAPHY
Anesthesia: Moderate Sedation

## 2023-03-02 MED ORDER — METOPROLOL TARTRATE 100 MG PO TABS: ORAL_TABLET | ORAL | 0 refills | Status: DC

## 2023-03-02 NOTE — Telephone Encounter (Signed)
        Patient  visited [facility] on 02/27/2023  for shortness of breath.   Telephone encounter attempt :  1st  A HIPAA compliant voice message was left requesting a return call.  Instructed patient to call back at [phone].   Barrie Wale [facility]  [facility] Population Health Community Resource Care Guide   [email or web address]  ?? [phone]   Website: [email or web address]

## 2023-03-06 ENCOUNTER — Telehealth (HOSPITAL_COMMUNITY): Payer: Self-pay | Admitting: *Deleted

## 2023-03-06 ENCOUNTER — Telehealth: Payer: Self-pay

## 2023-03-06 NOTE — Telephone Encounter (Signed)
Transition Care Management Follow-up Telephone Call Date of discharge and from where: 02/27/2023 [facility] How have you been since you were released from the hospital? Patient stated he is feeling much better. Any questions or concerns? No  Items Reviewed: Did the pt receive and understand the discharge instructions provided? Yes  Medications obtained and verified? Yes  Other? No  Any new allergies since your discharge? No  Dietary orders reviewed? Yes Do you have support at home?  Patient's wife has been caring for him.    Follow up appointments reviewed:  PCP Hospital f/u appt confirmed?  Patient saw Aram Beecham, MD on 02/28/2023   Specialist Hospital f/u appt confirmed?  Patient saw Dorothyann Peng MD 03/01/2023   Are transportation arrangements needed? No  If their condition worsens, is the pt aware to call PCP or go to the Emergency Dept.? Yes Was the patient provided with contact information for the PCP's office or ED? Yes Was to pt encouraged to call back with questions or concerns? Yes  Recia Sons [facility]  [facility] Population Health Community Resource Care Guide   [email or web address]  ?? [phone]   Website: [email or web address]

## 2023-03-06 NOTE — Telephone Encounter (Signed)
Reaching out to patient to offer assistance regarding upcoming cardiac imaging study; pt's wife answered phone and verbalizes understanding of appt date/time, parking situation and where to check in, pre-test NPO status and medications ordered, and verified current allergies; name and call back number provided for further questions should they arise  Larey Brick RN Navigator Cardiac Imaging [facility] [phone] office [phone] cell  Patient to take 100mg  metoprolol tartrate two hours prior to his cardiac CT scan. Patient's wife is aware he is to arrive at 10:30am.

## 2023-03-07 ENCOUNTER — Ambulatory Visit (HOSPITAL_COMMUNITY)
Admission: RE | Admit: 2023-03-07 | Discharge: 2023-03-07 | Disposition: A | Discharge status: Home / Self Care | Payer: Medicare HMO | Source: Ambulatory Visit | Attending: Internal Medicine | Admitting: Internal Medicine

## 2023-03-07 ENCOUNTER — Other Ambulatory Visit: Payer: Self-pay | Admitting: Cardiovascular Disease

## 2023-03-07 ENCOUNTER — Ambulatory Visit (HOSPITAL_BASED_OUTPATIENT_CLINIC_OR_DEPARTMENT_OTHER)
Admission: RE | Admit: 2023-03-07 | Discharge: 2023-03-07 | Disposition: A | Discharge status: Home / Self Care | Payer: Medicare HMO | Source: Ambulatory Visit | Attending: Cardiovascular Disease | Admitting: Cardiovascular Disease

## 2023-03-07 DIAGNOSIS — I2510 Atherosclerotic heart disease of native coronary artery without angina pectoris: Secondary | ICD-10-CM

## 2023-03-07 DIAGNOSIS — R931 Abnormal findings on diagnostic imaging of heart and coronary circulation: Secondary | ICD-10-CM

## 2023-03-07 DIAGNOSIS — I2089 Other forms of angina pectoris: Secondary | ICD-10-CM | POA: Diagnosis present

## 2023-03-07 DIAGNOSIS — R0602 Shortness of breath: Secondary | ICD-10-CM | POA: Insufficient documentation

## 2023-03-07 HISTORY — DX: Atherosclerotic heart disease of native coronary artery without angina pectoris: I25.10

## 2023-03-07 MED ORDER — NITROGLYCERIN 0.4 MG SL SUBL
0.8000 mg | SUBLINGUAL_TABLET | Freq: Once | SUBLINGUAL | Status: AC
  Administered 2023-03-07: 0.8 mg via SUBLINGUAL

## 2023-03-07 MED ORDER — NITROGLYCERIN 0.4 MG SL SUBL
SUBLINGUAL_TABLET | SUBLINGUAL | Status: AC
  Filled 2023-03-07: qty 2

## 2023-03-07 MED ORDER — IOHEXOL 350 MG/ML SOLN
100.0000 mL | Freq: Once | INTRAVENOUS | Status: AC | PRN
  Administered 2023-03-07: 100 mL via INTRAVENOUS

## 2023-03-07 NOTE — Progress Notes (Signed)
Patient tolerated CT well.Vital signs stable encourage to drink water throughout day.Reasons explained and verbalized understanding. Ambulated steady gait.   

## 2023-03-19 ENCOUNTER — Other Ambulatory Visit
Admission: RE | Admit: 2023-03-19 | Discharge: 2023-03-19 | Disposition: A | Discharge status: Home / Self Care | Payer: Medicare HMO | Source: Ambulatory Visit | Attending: Internal Medicine | Admitting: Internal Medicine

## 2023-03-19 DIAGNOSIS — Z01818 Encounter for other preprocedural examination: Secondary | ICD-10-CM | POA: Insufficient documentation

## 2023-03-19 DIAGNOSIS — E782 Mixed hyperlipidemia: Secondary | ICD-10-CM | POA: Diagnosis not present

## 2023-03-19 DIAGNOSIS — J431 Panlobular emphysema: Secondary | ICD-10-CM | POA: Diagnosis not present

## 2023-03-19 DIAGNOSIS — R0602 Shortness of breath: Secondary | ICD-10-CM | POA: Insufficient documentation

## 2023-03-19 DIAGNOSIS — I2510 Atherosclerotic heart disease of native coronary artery without angina pectoris: Secondary | ICD-10-CM | POA: Diagnosis not present

## 2023-03-19 DIAGNOSIS — R931 Abnormal findings on diagnostic imaging of heart and coronary circulation: Secondary | ICD-10-CM | POA: Diagnosis not present

## 2023-03-19 DIAGNOSIS — I10 Essential (primary) hypertension: Secondary | ICD-10-CM | POA: Diagnosis not present

## 2023-03-19 DIAGNOSIS — K219 Gastro-esophageal reflux disease without esophagitis: Secondary | ICD-10-CM | POA: Diagnosis not present

## 2023-03-19 DIAGNOSIS — Z720 Tobacco use: Secondary | ICD-10-CM | POA: Diagnosis not present

## 2023-03-19 LAB — BRAIN NATRIURETIC PEPTIDE: B Natriuretic Peptide: 27.1 pg/mL (ref 0.0–100.0)

## 2023-03-23 DIAGNOSIS — M170 Bilateral primary osteoarthritis of knee: Secondary | ICD-10-CM | POA: Diagnosis not present

## 2023-03-29 ENCOUNTER — Other Ambulatory Visit: Payer: Self-pay

## 2023-03-29 ENCOUNTER — Encounter
Admission: RE | Disposition: A | Discharge status: Home / Self Care | Payer: Self-pay | Source: Home / Self Care | Attending: Internal Medicine

## 2023-03-29 ENCOUNTER — Encounter: Payer: Self-pay | Admitting: Internal Medicine

## 2023-03-29 ENCOUNTER — Ambulatory Visit
Admission: RE | Admit: 2023-03-29 | Discharge: 2023-03-29 | Disposition: A | Discharge status: Home / Self Care | Payer: Medicare HMO | Attending: Internal Medicine | Admitting: Internal Medicine

## 2023-03-29 DIAGNOSIS — I10 Essential (primary) hypertension: Secondary | ICD-10-CM | POA: Insufficient documentation

## 2023-03-29 DIAGNOSIS — I2510 Atherosclerotic heart disease of native coronary artery without angina pectoris: Secondary | ICD-10-CM | POA: Insufficient documentation

## 2023-03-29 DIAGNOSIS — F17200 Nicotine dependence, unspecified, uncomplicated: Secondary | ICD-10-CM | POA: Diagnosis not present

## 2023-03-29 DIAGNOSIS — J431 Panlobular emphysema: Secondary | ICD-10-CM | POA: Insufficient documentation

## 2023-03-29 DIAGNOSIS — E782 Mixed hyperlipidemia: Secondary | ICD-10-CM | POA: Diagnosis not present

## 2023-03-29 DIAGNOSIS — R9439 Abnormal result of other cardiovascular function study: Secondary | ICD-10-CM | POA: Diagnosis not present

## 2023-03-29 DIAGNOSIS — R931 Abnormal findings on diagnostic imaging of heart and coronary circulation: Secondary | ICD-10-CM | POA: Diagnosis not present

## 2023-03-29 HISTORY — PX: RIGHT/LEFT HEART CATH AND CORONARY ANGIOGRAPHY: CATH118266

## 2023-03-29 SURGERY — RIGHT/LEFT HEART CATH AND CORONARY ANGIOGRAPHY
Anesthesia: Moderate Sedation | Laterality: Bilateral

## 2023-03-29 MED ORDER — SODIUM CHLORIDE 0.9 % IV SOLN: 250.0000 mL | INTRAVENOUS | Status: DC | PRN

## 2023-03-29 MED ORDER — LABETALOL HCL 5 MG/ML IV SOLN: 10.0000 mg | INTRAVENOUS | Status: DC | PRN

## 2023-03-29 MED ORDER — SODIUM CHLORIDE 0.9% FLUSH: 3.0000 mL | Freq: Two times a day (BID) | INTRAVENOUS | Status: DC

## 2023-03-29 MED ORDER — HEPARIN SODIUM (PORCINE) 1000 UNIT/ML IJ SOLN
INTRAMUSCULAR | Status: AC
  Filled 2023-03-29: qty 10

## 2023-03-29 MED ORDER — VERAPAMIL HCL 2.5 MG/ML IV SOLN
INTRAVENOUS | Status: AC
  Filled 2023-03-29: qty 2

## 2023-03-29 MED ORDER — FENTANYL CITRATE (PF) 100 MCG/2ML IJ SOLN
INTRAMUSCULAR | Status: AC
  Filled 2023-03-29: qty 2

## 2023-03-29 MED ORDER — SODIUM CHLORIDE 0.9 % WEIGHT BASED INFUSION
3.0000 mL/kg/h | INTRAVENOUS | Status: AC
  Administered 2023-03-29: 3 mL/kg/h via INTRAVENOUS

## 2023-03-29 MED ORDER — FENTANYL CITRATE (PF) 100 MCG/2ML IJ SOLN
INTRAMUSCULAR | Status: DC | PRN
  Administered 2023-03-29: 25 ug via INTRAVENOUS

## 2023-03-29 MED ORDER — SODIUM CHLORIDE 0.9% FLUSH: 3.0000 mL | INTRAVENOUS | Status: DC | PRN

## 2023-03-29 MED ORDER — MIDAZOLAM HCL 2 MG/2ML IJ SOLN
INTRAMUSCULAR | Status: DC | PRN
  Administered 2023-03-29: 1 mg via INTRAVENOUS

## 2023-03-29 MED ORDER — SODIUM CHLORIDE 0.9 % WEIGHT BASED INFUSION: 1.0000 mL/kg/h | INTRAVENOUS | Status: DC

## 2023-03-29 MED ORDER — HEPARIN SODIUM (PORCINE) 1000 UNIT/ML IJ SOLN
INTRAMUSCULAR | Status: DC | PRN
  Administered 2023-03-29: 4000 [IU] via INTRAVENOUS

## 2023-03-29 MED ORDER — HYDRALAZINE HCL 20 MG/ML IJ SOLN: 10.0000 mg | INTRAMUSCULAR | Status: DC | PRN

## 2023-03-29 MED ORDER — ASPIRIN 81 MG PO CHEW
81.0000 mg | CHEWABLE_TABLET | ORAL | Status: AC
  Administered 2023-03-29: 81 mg via ORAL

## 2023-03-29 MED ORDER — LIDOCAINE HCL (PF) 1 % IJ SOLN
INTRAMUSCULAR | Status: DC | PRN
  Administered 2023-03-29: 5 mL

## 2023-03-29 MED ORDER — SODIUM CHLORIDE 0.9 % WEIGHT BASED INFUSION
1.0000 mL/kg/h | INTRAVENOUS | Status: DC
  Administered 2023-03-29: 1 mL/kg/h via INTRAVENOUS

## 2023-03-29 MED ORDER — MIDAZOLAM HCL 2 MG/2ML IJ SOLN
INTRAMUSCULAR | Status: AC
  Filled 2023-03-29: qty 2

## 2023-03-29 MED ORDER — HEPARIN (PORCINE) IN NACL 1000-0.9 UT/500ML-% IV SOLN
INTRAVENOUS | Status: DC | PRN
  Administered 2023-03-29 (×2): 500 mL

## 2023-03-29 MED ORDER — VERAPAMIL HCL 2.5 MG/ML IV SOLN
INTRAVENOUS | Status: DC | PRN
  Administered 2023-03-29: 2.5 mg via INTRA_ARTERIAL

## 2023-03-29 MED ORDER — ASPIRIN 81 MG PO CHEW
CHEWABLE_TABLET | ORAL | Status: AC
  Filled 2023-03-29: qty 1

## 2023-03-29 MED ORDER — HEPARIN (PORCINE) IN NACL 1000-0.9 UT/500ML-% IV SOLN
INTRAVENOUS | Status: AC
  Filled 2023-03-29: qty 1000

## 2023-03-29 MED ORDER — IOHEXOL 300 MG/ML  SOLN
INTRAMUSCULAR | Status: DC | PRN
  Administered 2023-03-29: 118 mL

## 2023-03-29 MED ORDER — ONDANSETRON HCL 4 MG/2ML IJ SOLN: 4.0000 mg | Freq: Four times a day (QID) | INTRAMUSCULAR | Status: DC | PRN

## 2023-03-29 MED ORDER — ACETAMINOPHEN 325 MG PO TABS: 650.0000 mg | ORAL_TABLET | ORAL | Status: DC | PRN

## 2023-03-29 MED ORDER — SODIUM CHLORIDE 0.9 % WEIGHT BASED INFUSION: 3.0000 mL/kg/h | INTRAVENOUS | Status: DC

## 2023-03-29 SURGICAL SUPPLY — 13 items
CATH 5FR JL3.5 JR4 ANG PIG MP (CATHETERS) IMPLANT
CATH BALLN WEDGE 5F 110CM (CATHETERS) IMPLANT
DEVICE RAD TR BAND REGULAR (VASCULAR PRODUCTS) IMPLANT
DRAPE BRACHIAL (DRAPES) IMPLANT
GLIDESHEATH SLEND SS 6F .021 (SHEATH) IMPLANT
GUIDEWIRE INQWIRE 1.5J.035X260 (WIRE) IMPLANT
INQWIRE 1.5J .035X260CM (WIRE) ×1
KIT MICROPUNCTURE NIT STIFF (SHEATH) IMPLANT
PACK CARDIAC CATH (CUSTOM PROCEDURE TRAY) ×1 IMPLANT
PROTECTION STATION PRESSURIZED (MISCELLANEOUS) ×1
SET ATX-X65L (MISCELLANEOUS) IMPLANT
SHEATH GLIDE SLENDER 4/5FR (SHEATH) IMPLANT
STATION PROTECTION PRESSURIZED (MISCELLANEOUS) IMPLANT

## 2023-03-30 DIAGNOSIS — J449 Chronic obstructive pulmonary disease, unspecified: Secondary | ICD-10-CM | POA: Diagnosis not present

## 2023-03-30 DIAGNOSIS — R0602 Shortness of breath: Secondary | ICD-10-CM | POA: Diagnosis not present

## 2023-04-03 LAB — POCT I-STAT EG7
Acid-Base Excess: 2 mmol/L (ref 0.0–2.0)
Bicarbonate: 29.1 mmol/L — ABNORMAL HIGH (ref 20.0–28.0)
Calcium, Ion: 1.19 mmol/L (ref 1.15–1.40)
HCT: 37 % — ABNORMAL LOW (ref 39.0–52.0)
Hemoglobin: 12.6 g/dL — ABNORMAL LOW (ref 13.0–17.0)
O2 Saturation: 71 %
Potassium: 3.8 mmol/L (ref 3.5–5.1)
Sodium: 134 mmol/L — ABNORMAL LOW (ref 135–145)
TCO2: 31 mmol/L (ref 22–32)
pCO2, Ven: 52.9 mmHg (ref 44–60)
pH, Ven: 7.349 (ref 7.25–7.43)
pO2, Ven: 40 mmHg (ref 32–45)

## 2023-04-03 LAB — POCT I-STAT 7, (LYTES, BLD GAS, ICA,H+H)
Acid-Base Excess: 1 mmol/L (ref 0.0–2.0)
Bicarbonate: 26.6 mmol/L (ref 20.0–28.0)
Calcium, Ion: 1.2 mmol/L (ref 1.15–1.40)
HCT: 37 % — ABNORMAL LOW (ref 39.0–52.0)
Hemoglobin: 12.6 g/dL — ABNORMAL LOW (ref 13.0–17.0)
O2 Saturation: 97 %
Potassium: 3.8 mmol/L (ref 3.5–5.1)
Sodium: 135 mmol/L (ref 135–145)
TCO2: 28 mmol/L (ref 22–32)
pCO2 arterial: 46.3 mmHg (ref 32–48)
pH, Arterial: 7.368 (ref 7.35–7.45)
pO2, Arterial: 91 mmHg (ref 83–108)

## 2023-04-04 DIAGNOSIS — J431 Panlobular emphysema: Secondary | ICD-10-CM | POA: Diagnosis not present

## 2023-04-04 DIAGNOSIS — I10 Essential (primary) hypertension: Secondary | ICD-10-CM | POA: Diagnosis not present

## 2023-04-04 DIAGNOSIS — E782 Mixed hyperlipidemia: Secondary | ICD-10-CM | POA: Diagnosis not present

## 2023-04-04 DIAGNOSIS — R0602 Shortness of breath: Secondary | ICD-10-CM | POA: Diagnosis not present

## 2023-04-04 DIAGNOSIS — I2510 Atherosclerotic heart disease of native coronary artery without angina pectoris: Secondary | ICD-10-CM | POA: Diagnosis not present

## 2023-04-04 DIAGNOSIS — K219 Gastro-esophageal reflux disease without esophagitis: Secondary | ICD-10-CM | POA: Diagnosis not present

## 2023-04-04 DIAGNOSIS — Z720 Tobacco use: Secondary | ICD-10-CM | POA: Diagnosis not present

## 2023-04-10 ENCOUNTER — Encounter: Payer: Self-pay | Admitting: Internal Medicine

## 2023-04-10 LAB — CARDIAC CATHETERIZATION: Cath EF Quantitative: 60 %

## 2023-05-21 DIAGNOSIS — M25562 Pain in left knee: Secondary | ICD-10-CM | POA: Diagnosis not present

## 2023-05-21 DIAGNOSIS — M170 Bilateral primary osteoarthritis of knee: Secondary | ICD-10-CM | POA: Diagnosis not present

## 2023-05-21 DIAGNOSIS — M25561 Pain in right knee: Secondary | ICD-10-CM | POA: Diagnosis not present

## 2023-05-21 DIAGNOSIS — G8929 Other chronic pain: Secondary | ICD-10-CM | POA: Diagnosis not present

## 2023-06-08 DIAGNOSIS — M1712 Unilateral primary osteoarthritis, left knee: Secondary | ICD-10-CM | POA: Diagnosis not present

## 2023-06-08 DIAGNOSIS — M1711 Unilateral primary osteoarthritis, right knee: Secondary | ICD-10-CM | POA: Diagnosis not present

## 2023-06-11 DIAGNOSIS — M1711 Unilateral primary osteoarthritis, right knee: Principal | ICD-10-CM | POA: Insufficient documentation

## 2023-06-11 DIAGNOSIS — M1712 Unilateral primary osteoarthritis, left knee: Secondary | ICD-10-CM | POA: Insufficient documentation

## 2023-06-20 DIAGNOSIS — M9901 Segmental and somatic dysfunction of cervical region: Secondary | ICD-10-CM | POA: Diagnosis not present

## 2023-06-20 DIAGNOSIS — M9904 Segmental and somatic dysfunction of sacral region: Secondary | ICD-10-CM | POA: Diagnosis not present

## 2023-06-20 DIAGNOSIS — M7918 Myalgia, other site: Secondary | ICD-10-CM | POA: Diagnosis not present

## 2023-06-20 DIAGNOSIS — M5451 Vertebrogenic low back pain: Secondary | ICD-10-CM | POA: Diagnosis not present

## 2023-06-20 DIAGNOSIS — M542 Cervicalgia: Secondary | ICD-10-CM | POA: Diagnosis not present

## 2023-06-20 DIAGNOSIS — M50322 Other cervical disc degeneration at C5-C6 level: Secondary | ICD-10-CM | POA: Diagnosis not present

## 2023-06-20 DIAGNOSIS — M50323 Other cervical disc degeneration at C6-C7 level: Secondary | ICD-10-CM | POA: Diagnosis not present

## 2023-06-20 DIAGNOSIS — M9903 Segmental and somatic dysfunction of lumbar region: Secondary | ICD-10-CM | POA: Diagnosis not present

## 2023-06-28 DIAGNOSIS — Z0000 Encounter for general adult medical examination without abnormal findings: Secondary | ICD-10-CM | POA: Diagnosis not present

## 2023-06-28 DIAGNOSIS — E782 Mixed hyperlipidemia: Secondary | ICD-10-CM | POA: Diagnosis not present

## 2023-06-28 DIAGNOSIS — Z125 Encounter for screening for malignant neoplasm of prostate: Secondary | ICD-10-CM | POA: Diagnosis not present

## 2023-06-28 DIAGNOSIS — F17210 Nicotine dependence, cigarettes, uncomplicated: Secondary | ICD-10-CM | POA: Diagnosis not present

## 2023-06-28 DIAGNOSIS — I10 Essential (primary) hypertension: Secondary | ICD-10-CM | POA: Diagnosis not present

## 2023-06-28 DIAGNOSIS — I2510 Atherosclerotic heart disease of native coronary artery without angina pectoris: Secondary | ICD-10-CM | POA: Diagnosis not present

## 2023-06-28 DIAGNOSIS — I25118 Atherosclerotic heart disease of native coronary artery with other forms of angina pectoris: Secondary | ICD-10-CM | POA: Insufficient documentation

## 2023-06-28 DIAGNOSIS — Z79899 Other long term (current) drug therapy: Secondary | ICD-10-CM | POA: Diagnosis not present

## 2023-06-28 DIAGNOSIS — J431 Panlobular emphysema: Secondary | ICD-10-CM | POA: Diagnosis not present

## 2023-06-29 DIAGNOSIS — Z125 Encounter for screening for malignant neoplasm of prostate: Secondary | ICD-10-CM | POA: Diagnosis not present

## 2023-06-29 DIAGNOSIS — J449 Chronic obstructive pulmonary disease, unspecified: Secondary | ICD-10-CM | POA: Diagnosis not present

## 2023-06-29 DIAGNOSIS — Z79899 Other long term (current) drug therapy: Secondary | ICD-10-CM | POA: Diagnosis not present

## 2023-06-29 DIAGNOSIS — E782 Mixed hyperlipidemia: Secondary | ICD-10-CM | POA: Diagnosis not present

## 2023-06-29 DIAGNOSIS — I10 Essential (primary) hypertension: Secondary | ICD-10-CM | POA: Diagnosis not present

## 2023-07-25 DIAGNOSIS — M9901 Segmental and somatic dysfunction of cervical region: Secondary | ICD-10-CM | POA: Diagnosis not present

## 2023-07-25 DIAGNOSIS — M542 Cervicalgia: Secondary | ICD-10-CM | POA: Diagnosis not present

## 2023-07-25 DIAGNOSIS — M50323 Other cervical disc degeneration at C6-C7 level: Secondary | ICD-10-CM | POA: Diagnosis not present

## 2023-07-25 DIAGNOSIS — M9903 Segmental and somatic dysfunction of lumbar region: Secondary | ICD-10-CM | POA: Diagnosis not present

## 2023-07-25 DIAGNOSIS — M9904 Segmental and somatic dysfunction of sacral region: Secondary | ICD-10-CM | POA: Diagnosis not present

## 2023-07-25 DIAGNOSIS — M5451 Vertebrogenic low back pain: Secondary | ICD-10-CM | POA: Diagnosis not present

## 2023-07-25 DIAGNOSIS — M50322 Other cervical disc degeneration at C5-C6 level: Secondary | ICD-10-CM | POA: Diagnosis not present

## 2023-07-25 DIAGNOSIS — M7918 Myalgia, other site: Secondary | ICD-10-CM | POA: Diagnosis not present

## 2023-07-30 NOTE — Discharge Instructions (Signed)

## 2023-08-01 ENCOUNTER — Encounter
Admission: RE | Admit: 2023-08-01 | Discharge: 2023-08-01 | Disposition: A | Discharge status: Home / Self Care | Payer: Medicare HMO | Source: Ambulatory Visit | Attending: Orthopedic Surgery | Admitting: Orthopedic Surgery

## 2023-08-01 VITALS — BP 135/85 | HR 77 | Resp 16 | Ht 70.0 in | Wt 191.8 lb

## 2023-08-01 DIAGNOSIS — Z01812 Encounter for preprocedural laboratory examination: Secondary | ICD-10-CM | POA: Diagnosis not present

## 2023-08-01 DIAGNOSIS — K219 Gastro-esophageal reflux disease without esophagitis: Secondary | ICD-10-CM | POA: Diagnosis not present

## 2023-08-01 DIAGNOSIS — I10 Essential (primary) hypertension: Secondary | ICD-10-CM | POA: Diagnosis not present

## 2023-08-01 DIAGNOSIS — J309 Allergic rhinitis, unspecified: Secondary | ICD-10-CM | POA: Diagnosis not present

## 2023-08-01 DIAGNOSIS — M1711 Unilateral primary osteoarthritis, right knee: Secondary | ICD-10-CM | POA: Diagnosis not present

## 2023-08-01 DIAGNOSIS — Z79899 Other long term (current) drug therapy: Secondary | ICD-10-CM | POA: Diagnosis not present

## 2023-08-01 DIAGNOSIS — Z01818 Encounter for other preprocedural examination: Secondary | ICD-10-CM

## 2023-08-01 DIAGNOSIS — Z823 Family history of stroke: Secondary | ICD-10-CM | POA: Diagnosis not present

## 2023-08-01 DIAGNOSIS — F17210 Nicotine dependence, cigarettes, uncomplicated: Secondary | ICD-10-CM | POA: Diagnosis not present

## 2023-08-01 DIAGNOSIS — Z792 Long term (current) use of antibiotics: Secondary | ICD-10-CM | POA: Diagnosis not present

## 2023-08-01 DIAGNOSIS — M1990 Unspecified osteoarthritis, unspecified site: Secondary | ICD-10-CM | POA: Diagnosis not present

## 2023-08-01 DIAGNOSIS — Z791 Long term (current) use of non-steroidal anti-inflammatories (NSAID): Secondary | ICD-10-CM | POA: Diagnosis not present

## 2023-08-01 DIAGNOSIS — E785 Hyperlipidemia, unspecified: Secondary | ICD-10-CM | POA: Diagnosis not present

## 2023-08-01 DIAGNOSIS — Z7951 Long term (current) use of inhaled steroids: Secondary | ICD-10-CM | POA: Diagnosis not present

## 2023-08-01 DIAGNOSIS — J439 Emphysema, unspecified: Secondary | ICD-10-CM | POA: Diagnosis not present

## 2023-08-01 HISTORY — DX: Tobacco use: Z72.0

## 2023-08-01 HISTORY — DX: Unspecified chronic bronchitis: J42

## 2023-08-01 HISTORY — DX: Dyspnea, unspecified: R06.00

## 2023-08-01 HISTORY — DX: Chronic obstructive pulmonary disease, unspecified: J44.9

## 2023-08-01 HISTORY — DX: Cardiac arrhythmia, unspecified: I49.9

## 2023-08-01 HISTORY — DX: Other cervical disc degeneration, unspecified cervical region: M50.30

## 2023-08-01 HISTORY — DX: Gastro-esophageal reflux disease without esophagitis: K21.9

## 2023-08-01 HISTORY — DX: Unspecified osteoarthritis, unspecified site: M19.90

## 2023-08-01 LAB — SURGICAL PCR SCREEN
MRSA, PCR: NEGATIVE
Staphylococcus aureus: NEGATIVE

## 2023-08-01 LAB — URINALYSIS, ROUTINE W REFLEX MICROSCOPIC
Bilirubin Urine: NEGATIVE
Glucose, UA: NEGATIVE mg/dL
Hgb urine dipstick: NEGATIVE
Ketones, ur: NEGATIVE mg/dL
Leukocytes,Ua: NEGATIVE
Nitrite: NEGATIVE
Protein, ur: NEGATIVE mg/dL
Specific Gravity, Urine: 1.015 (ref 1.005–1.030)
pH: 5 (ref 5.0–8.0)

## 2023-08-01 LAB — SEDIMENTATION RATE: Sed Rate: 34 mm/hr — ABNORMAL HIGH (ref 0–20)

## 2023-08-01 LAB — TYPE AND SCREEN
ABO/RH(D): A POS
Antibody Screen: NEGATIVE

## 2023-08-01 LAB — BASIC METABOLIC PANEL
Anion gap: 11 (ref 5–15)
BUN: 17 mg/dL (ref 8–23)
CO2: 28 mmol/L (ref 22–32)
Calcium: 8.7 mg/dL — ABNORMAL LOW (ref 8.9–10.3)
Chloride: 97 mmol/L — ABNORMAL LOW (ref 98–111)
Creatinine, Ser: 0.82 mg/dL (ref 0.61–1.24)
GFR, Estimated: >60 mL/min (ref >60)
Glucose, Bld: 100 mg/dL — ABNORMAL HIGH (ref 70–99)
Potassium: 3.4 mmol/L — ABNORMAL LOW (ref 3.5–5.1)
Sodium: 136 mmol/L (ref 135–145)

## 2023-08-01 LAB — C-REACTIVE PROTEIN: CRP: 1.4 mg/dL — ABNORMAL HIGH (ref <1.0)

## 2023-08-01 NOTE — Patient Instructions (Addendum)
Your procedure is scheduled on:08-10-23 Friday Report to the Registration Desk on the 1st floor of the [facility].Then proceed to the 2nd floor Surgery Desk To find out your arrival time, please call [phone] between 1PM - 3PM on:08-09-23 Thursday If your arrival time is 6:00 am, do not arrive before that time as the [facility] entrance doors do not open until 6:00 am.  REMEMBER: Instructions that are not followed completely may result in serious medical risk, up to and including death; or upon the discretion of your surgeon and anesthesiologist your surgery may need to be rescheduled.  Do not eat food after midnight the night before surgery.  No gum chewing or hard candies.  You may however, drink CLEAR liquids up to 2 hours before you are scheduled to arrive for your surgery. Do not drink anything within 2 hours of your scheduled arrival time.  Clear liquids include: - water  - apple juice without pulp - gatorade (not RED colors) - black coffee or tea (Do NOT add milk or creamers to the coffee or tea) Do NOT drink anything that is not on this list.  In addition, your doctor has ordered for you to drink the provided:  Ensure Pre-Surgery Clear Carbohydrate Drink  Drinking this carbohydrate drink up to two hours before surgery helps to reduce insulin resistance and improve patient outcomes. Please complete drinking 2 hours before scheduled arrival time.  One week prior to surgery: Stop Anti-inflammatories (NSAIDS) such as Advil, Aleve, Ibuprofen, Motrin, Naproxen, Naprosyn and Aspirin based products such as Excedrin, Goody's Powder, BC Powder.You may however, take Tylenol if needed for pain up until the day of surgery. Stop ANY OVER THE COUNTER supplements/vitamins NOW (08-01-23) until after surgery ( Vitamin C)   Continue taking all prescribed medications with the exception of the following: -meloxicam (MOBIC)-Stop NOW (08-01-23)   TAKE ONLY THESE MEDICATIONS THE MORNING OF  SURGERY WITH A SIP OF WATER: -amLODipine (NORVASC)  -rosuvastatin (CRESTOR)  -esomeprazole (NEXIUM)-take one the night before and one on the morning of surgery - helps to prevent nausea after surgery.)  Continue your 81 mg Aspirin up until the day prior to surgery-Do NOT take the morning of surgery  Use your ALbuterol Nebulizer the day of surgery and your Trelegy Ellipta Inhaler and bring your Albuterol Inhaler to the hospital  No Alcohol for 24 hours before or after surgery.  No Smoking including e-cigarettes for 24 hours before surgery.  No chewable tobacco products for at least 6 hours before surgery.  No nicotine patches on the day of surgery.  Do not use any "recreational" drugs for at least a week (preferably 2 weeks) before your surgery.  Please be advised that the combination of cocaine and anesthesia may have negative outcomes, up to and including death. If you test positive for cocaine, your surgery will be cancelled.  On the morning of surgery brush your teeth with toothpaste and water, you may rinse your mouth with mouthwash if you wish. Do not swallow any toothpaste or mouthwash.  Use CHG Soap as directed on instruction sheet.  Do not wear jewelry, make-up, hairpins, clips or nail polish.  For welded (permanent) jewelry: bracelets, anklets, waist bands, etc.  Please have this removed prior to surgery.  If it is not removed, there is a chance that hospital personnel will need to cut it off on the day of surgery.  Do not wear lotions, powders, or perfumes.   Do not shave body hair from the neck down  48 hours before surgery.  Contact lenses, hearing aids and dentures may not be worn into surgery.  Do not bring valuables to the hospital. [facility] is not responsible for any missing/lost belongings or valuables.   Notify your doctor if there is any change in your medical condition (cold, fever, infection).  Wear comfortable clothing (specific to your surgery type) to  the hospital.  After surgery, you can help prevent lung complications by doing breathing exercises.  Take deep breaths and cough every 1-2 hours. Your doctor may order a device called an Incentive Spirometer to help you take deep breaths. When coughing or sneezing, hold a pillow firmly against your incision with both hands. This is called "splinting." Doing this helps protect your incision. It also decreases belly discomfort.  If you are being admitted to the hospital overnight, leave your suitcase in the car. After surgery it may be brought to your room.  In case of increased patient census, it may be necessary for you, the patient, to continue your postoperative care in the Same Day Surgery department.  If you are being discharged the day of surgery, you will not be allowed to drive home. You will need a responsible individual to drive you home and stay with you for 24 hours after surgery.   If you are taking public transportation, you will need to have a responsible individual with you.  Please call the Pre-admissions Testing Dept. at [phone] if you have any questions about these instructions.  Surgery Visitation Policy:  Patients having surgery or a procedure may have two visitors.  Children under the age of 49 must have an adult with them who is not the patient.  Inpatient Visitation:    Visiting hours are 7 a.m. to 8 p.m. Up to four visitors are allowed at one time in a patient room. The visitors may rotate out with other people during the day.  One visitor age 76 or older may stay with the patient overnight and must be in the room by 8 p.m.    Pre-operative 5 CHG Bath Instructions   You can play a key role in reducing the risk of infection after surgery. Your skin needs to be as free of germs as possible. You can reduce the number of germs on your skin by washing with CHG (chlorhexidine gluconate) soap before surgery. CHG is an antiseptic soap that kills germs and  continues to kill germs even after washing.   DO NOT use if you have an allergy to chlorhexidine/CHG or antibacterial soaps. If your skin becomes reddened or irritated, stop using the CHG and notify one of our RNs at [phone].   Please shower with the CHG soap starting 4 days before surgery using the following schedule:     Please keep in mind the following:  DO NOT shave, including legs and underarms, starting the day of your first shower.   You may shave your face at any point before/day of surgery.  Place clean sheets on your bed the day you start using CHG soap. Use a clean washcloth (not used since being washed) for each shower. DO NOT sleep with pets once you start using the CHG.   CHG Shower Instructions:  If you choose to wash your hair and private area, wash first with your normal shampoo/soap.  After you use shampoo/soap, rinse your hair and body thoroughly to remove shampoo/soap residue.  Turn the water OFF and apply about 3 tablespoons (45 ml) of CHG soap to  a Animal nutritionist.  Apply CHG soap ONLY FROM YOUR NECK DOWN TO YOUR TOES (washing for 3-5 minutes)  DO NOT use CHG soap on face, private areas, open wounds, or sores.  Pay special attention to the area where your surgery is being performed.  If you are having back surgery, having someone wash your back for you may be helpful. Wait 2 minutes after CHG soap is applied, then you may rinse off the CHG soap.  Pat dry with a clean towel  Put on clean clothes/pajamas   If you choose to wear lotion, please use ONLY the CHG-compatible lotions on the back of this paper.     Additional instructions for the day of surgery: DO NOT APPLY any lotions, deodorants, cologne, or perfumes.   Put on clean/comfortable clothes.  Brush your teeth.  Ask your nurse before applying any prescription medications to the skin.      CHG Compatible Lotions   Aveeno Moisturizing lotion  Cetaphil Moisturizing Cream  Cetaphil Moisturizing  Lotion  Clairol Herbal Essence Moisturizing Lotion, Dry Skin  Clairol Herbal Essence Moisturizing Lotion, Extra Dry Skin  Clairol Herbal Essence Moisturizing Lotion, Normal Skin  Curel Age Defying Therapeutic Moisturizing Lotion with Alpha Hydroxy  Curel Extreme Care Body Lotion  Curel Soothing Hands Moisturizing Hand Lotion  Curel Therapeutic Moisturizing Cream, Fragrance-Free  Curel Therapeutic Moisturizing Lotion, Fragrance-Free  Curel Therapeutic Moisturizing Lotion, Original Formula  Eucerin Daily Replenishing Lotion  Eucerin Dry Skin Therapy Plus Alpha Hydroxy Crme  Eucerin Dry Skin Therapy Plus Alpha Hydroxy Lotion  Eucerin Original Crme  Eucerin Original Lotion  Eucerin Plus Crme Eucerin Plus Lotion  Eucerin TriLipid Replenishing Lotion  Keri Anti-Bacterial Hand Lotion  Keri Deep Conditioning Original Lotion Dry Skin Formula Softly Scented  Keri Deep Conditioning Original Lotion, Fragrance Free Sensitive Skin Formula  Keri Lotion Fast Absorbing Fragrance Free Sensitive Skin Formula  Keri Lotion Fast Absorbing Softly Scented Dry Skin Formula  Keri Original Lotion  Keri Skin Renewal Lotion Keri Silky Smooth Lotion  Keri Silky Smooth Sensitive Skin Lotion  Nivea Body Creamy Conditioning Oil  Nivea Body Extra Enriched Lotion  Nivea Body Original Lotion  Nivea Body Sheer Moisturizing Lotion Nivea Crme  Nivea Skin Firming Lotion  NutraDerm 30 Skin Lotion   NutraDerm Skin Lotion  NutraDerm Therapeutic Skin Cream  NutraDerm Therapeutic Skin Lotion  ProShield Protective Hand Cream  Provon moisturizing lotion  How to Use an Incentive Spirometer An incentive spirometer is a tool that measures how well you are filling your lungs with each breath. Learning to take long, deep breaths using this tool can help you keep your lungs clear and active. This may help to reverse or lessen your chance of developing breathing (pulmonary) problems, especially infection. You may be asked to  use a spirometer: After a surgery. If you have a lung problem or a history of smoking. After a long period of time when you have been unable to move or be active. If the spirometer includes an indicator to show the highest number that you have reached, your health care provider or respiratory therapist will help you set a goal. Keep a log of your progress as told by your health care provider. What are the risks? Breathing too quickly may cause dizziness or cause you to pass out. Take your time so you do not get dizzy or light-headed. If you are in pain, you may need to take pain medicine before doing incentive spirometry. It is harder to take a deep  breath if you are having pain. How to use your incentive spirometer  Sit up on the edge of your bed or on a chair. Hold the incentive spirometer so that it is in an upright position. Before you use the spirometer, breathe out normally. Place the mouthpiece in your mouth. Make sure your lips are closed tightly around it. Breathe in slowly and as deeply as you can through your mouth, causing the piston or the ball to rise toward the top of the chamber. Hold your breath for 3-5 seconds, or for as long as possible. If the spirometer includes a coach indicator, use this to guide you in breathing. Slow down your breathing if the indicator goes above the marked areas. Remove the mouthpiece from your mouth and breathe out normally. The piston or ball will return to the bottom of the chamber. Rest for a few seconds, then repeat the steps 10 or more times. Take your time and take a few normal breaths between deep breaths so that you do not get dizzy or light-headed. Do this every 1-2 hours when you are awake. If the spirometer includes a goal marker to show the highest number you have reached (best effort), use this as a goal to work toward during each repetition. After each set of 10 deep breaths, cough a few times. This will help to make sure that your  lungs are clear. If you have an incision on your chest or abdomen from surgery, place a pillow or a rolled-up towel firmly against the incision when you cough. This can help to reduce pain while taking deep breaths and coughing. General tips When you are able to get out of bed: Walk around often. Continue to take deep breaths and cough in order to clear your lungs. Keep using the incentive spirometer until your health care provider says it is okay to stop using it. If you have been in the hospital, you may be told to keep using the spirometer at home. Contact a health care provider if: You are having difficulty using the spirometer. You have trouble using the spirometer as often as instructed. Your pain medicine is not giving enough relief for you to use the spirometer as told. You have a fever. Get help right away if: You develop shortness of breath. You develop a cough with bloody mucus from the lungs. You have fluid or blood coming from an incision site after you cough. Summary An incentive spirometer is a tool that can help you learn to take long, deep breaths to keep your lungs clear and active. You may be asked to use a spirometer after a surgery, if you have a lung problem or a history of smoking, or if you have been inactive for a long period of time. Use your incentive spirometer as instructed every 1-2 hours while you are awake. If you have an incision on your chest or abdomen, place a pillow or a rolled-up towel firmly against your incision when you cough. This will help to reduce pain. Get help right away if you have shortness of breath, you cough up bloody mucus, or blood comes from your incision when you cough. This information is not intended to replace advice given to you by your health care provider. Make sure you discuss any questions you have with your health care provider. Document Revised: 01/12/2020 Document Reviewed: 01/12/2020 Elsevier Patient Education  2024 [employer].

## 2023-08-02 DIAGNOSIS — M25562 Pain in left knee: Secondary | ICD-10-CM | POA: Diagnosis not present

## 2023-08-02 DIAGNOSIS — G8929 Other chronic pain: Secondary | ICD-10-CM | POA: Diagnosis not present

## 2023-08-02 DIAGNOSIS — M1711 Unilateral primary osteoarthritis, right knee: Secondary | ICD-10-CM | POA: Diagnosis not present

## 2023-08-02 DIAGNOSIS — M25561 Pain in right knee: Secondary | ICD-10-CM | POA: Diagnosis not present

## 2023-08-08 ENCOUNTER — Encounter: Payer: Self-pay | Admitting: Urgent Care
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
## 2023-08-09 NOTE — H&P (Signed)
ORTHOPAEDIC HISTORY & PHYSICAL Raenette Rover, [place] - 08/02/2023 8:15 AM EDT Formatting of this note is different from the original. NAME: Joe Dillon H&P Date: 08/02/2023 Procedure Date: 08/10/2023  Chief Complaint: right knee pain  HPI Joe Dillon is a 67 y.o. male who has severe knee pain and has failed conservative treatment including bracing, topicals, cortisone injections, viscosupplementation injections, NSAID's, and activity modification. He reports some swelling, no locking symptoms and intermittent times where the knee feels like it is going to give away. The pain mostly localizes to the medial aspect of his knee. He states that it is aggravated when he is up walking around or trying to go upstairs. It has started to limit his ability to ambulate long distances and perform his ADLs. He has requested operative intervention for relief of his DJD symptoms. He states that he does have a past cardiac catheterization due to narrowing of a coronary artery performed in May of this year. He also admits to having COPD and asthma. Patient does smoke, states around three quarters of a pack a day. He denies any illicit drug use. States he will have about 2 standard drinks of alcohol per week. He denies any issues with anesthesia in the past. He has no known history of any DVT or clots previously.  Social Hx: Patient still works as a Dentist. Patient does smoke about three quarters of a pack of cigarettes a day. He does not use any illicit drugs. And he has about 2 standard drinks of alcohol per week. Patient lives at home with his wife.  Medications & Allergies Allergies: No Known Allergies  Home Medicines: Current Outpatient Medications on File Prior to Visit Medication Sig Dispense Refill albuterol 90 mcg/actuation inhaler Inhale 2 inhalations into the lungs every 4 (four) hours as needed for Wheezing 1 each 2 amLODIPine (NORVASC) 10 MG tablet TAKE 1 TABLET BY MOUTH DAILY  90 tablet 1 ascorbic acid, vitamin C, (VITAMIN C) 1000 MG tablet Take 1,000 mg by mouth once daily aspirin 81 MG EC tablet Take 1 tablet (81 mg total) by mouth once daily 30 tablet 11 esomeprazole (NEXIUM) 20 MG DR capsule Take 20 mg by mouth once daily fluticasone-umeclidinium-vilanterol (TRELEGY ELLIPTA) 100-62.5-25 mcg inhaler Inhale 1 Puff into the lungs once daily 60 each 5 ipratropium-albuteroL (DUO-NEB) nebulizer solution Take 3 mLs by nebulization 4 (four) times daily as needed for Wheezing or Shortness of Breath for up to 90 days 360 mL 3 losartan-hydroCHLOROthiazide (HYZAAR) 100-12.5 mg tablet Take 1 tablet by mouth once daily meloxicam (MOBIC) 15 MG tablet Take 15 mg by mouth once daily rosuvastatin (CRESTOR) 40 MG tablet Take 1 tablet (40 mg total) by mouth once daily 30 tablet 11  No current facility-administered medications on file prior to visit.  Medical / Surgical History  Past Medical History: Diagnosis Date Arthritis Asthma, unspecified asthma severity, unspecified whether complicated, unspecified whether persistent (HHS-HCC) COPD (chronic obstructive pulmonary disease) (CMS/HHS-HCC) Emphysema of lung (CMS/HHS-HCC) Hypertension Irregular heart beat   Past Surgical History: Procedure Laterality Date Colon @ [facility] 08/04/2022 Tubular adenoma/Hyperplastic polyps/Enteritis/PHx CP/Repeat 23yrs/SMR Hemorrhoid Surgery HERNIA REPAIR   Physical Exam  Ht: Wt: BMI: There is no height or weight on file to calculate BMI.  General/Constitutional: No apparent distress: well-nourished and well developed. Eyes: Pupils equal, round with synchronous movement. Lymphatic: No palpable adenopathy. Respiratory: Patient has good chest rise and fall with inspiration and expiration. Bilateral lower lung fields have appreciable wheezes. There is no rales, rhonchi appreciated. Cardiovascular: Upon  auscultation there is a regular rate and rhythm without any murmurs, rubs, gallops or heaves  appreciated. There does not appear to be any swelling down the lower extremities. Posterior tibial pulses appreciated bilaterally, 2+. Integumentary: No impressive skin lesions present, except as noted in detailed exam. Neuro/Psych: Normal mood and affect, oriented to person, place and time. Musculoskeletal: see exam below  Right knee exam Upon inspection of the patient's right knee there does not appear to be any skin changes over the knee, open abrasions, swelling or redness. There is a varus alignment. Upon palpation, the patient reports having pain along the medial aspect of their knee. Patient about 6 degrees off of full extension actively with ROM, and able to flex back to 132 degrees with mild pain. Varus and valgus stress testing shows a stable joint. The patella tracks well within the femoral groove from flexion into extension, minimal crepitus appreciated. Anterior and posterior drawer testing negative. Patient is neurovascularly intact down their lower extremity to all dermatomes. Posterior tibial pulses appreciated 2+.  Of note there are some scabbed over areas along the distal, lateral aspect of the patient's shin. Does not appear to involve the surgical site.  Imaging right Knee Imaging: None ordered today, previous films taken on 05/21/2023 were reviewed. There is noticeable narrowing along the medial cartilage space with 90% narrowing and an overall varus alignment. There are subchondral changes appreciated. There is not appear to be any fractures, lytic lesions or gross deformities appreciated on films  Assesment and Plan Knee DJD  I have recommended that Joe Dillon undergo right total knee replacement. Consents has been signed. The risks, benefits, prognosis and alternatives including but not limited to DVT, PE, infection, neurovascular injury, failure of the procedure and death were explained to the patient and he is willing to proceed with surgery as described to him by  myself. Plan will be for post operative admission of at least 1 midnight for pain control and PT. He will be managed with DVT prophylaxis, antibiotics preoperatively for 24 hours and aggressive in patient rehab.  Pre, intra and post op interventions were discussed. Patient has good understanding  Medication Reconciliation was performed. Discussed cessation of meloxicam, vitamins and supplements.  A total of 50 minutes was spent reviewing patient's charts, medical reconciliation, discussing/educating the patient about surgical interventions, and answering any questions provided by the patient.  JOSHUA Kendrick Fries, PA [facility] 08/02/2023  Electronically signed by Raenette Rover, PA at 08/02/2023 12:27 PM EDT

## 2023-08-10 ENCOUNTER — Ambulatory Visit: Payer: Medicare HMO | Admitting: Urgent Care

## 2023-08-10 ENCOUNTER — Encounter
Admission: RE | Disposition: A | Discharge status: Home Health | Payer: Self-pay | Source: Ambulatory Visit | Attending: Orthopedic Surgery

## 2023-08-10 ENCOUNTER — Observation Stay: Payer: Medicare HMO

## 2023-08-10 ENCOUNTER — Other Ambulatory Visit: Payer: Self-pay

## 2023-08-10 ENCOUNTER — Observation Stay
Admission: RE | Admit: 2023-08-10 | Discharge: 2023-08-11 | Disposition: A | Discharge status: Home Health | Payer: Medicare HMO | Source: Ambulatory Visit | Attending: Orthopedic Surgery | Admitting: Orthopedic Surgery

## 2023-08-10 ENCOUNTER — Encounter: Payer: Self-pay | Admitting: Orthopedic Surgery

## 2023-08-10 DIAGNOSIS — J449 Chronic obstructive pulmonary disease, unspecified: Secondary | ICD-10-CM | POA: Insufficient documentation

## 2023-08-10 DIAGNOSIS — M1711 Unilateral primary osteoarthritis, right knee: Secondary | ICD-10-CM | POA: Diagnosis not present

## 2023-08-10 DIAGNOSIS — Z7982 Long term (current) use of aspirin: Secondary | ICD-10-CM | POA: Insufficient documentation

## 2023-08-10 DIAGNOSIS — I10 Essential (primary) hypertension: Secondary | ICD-10-CM | POA: Diagnosis not present

## 2023-08-10 DIAGNOSIS — Z96651 Presence of right artificial knee joint: Secondary | ICD-10-CM

## 2023-08-10 DIAGNOSIS — R609 Edema, unspecified: Secondary | ICD-10-CM | POA: Diagnosis not present

## 2023-08-10 DIAGNOSIS — Z96659 Presence of unspecified artificial knee joint: Secondary | ICD-10-CM

## 2023-08-10 DIAGNOSIS — F17210 Nicotine dependence, cigarettes, uncomplicated: Secondary | ICD-10-CM | POA: Diagnosis not present

## 2023-08-10 DIAGNOSIS — Z79899 Other long term (current) drug therapy: Secondary | ICD-10-CM | POA: Insufficient documentation

## 2023-08-10 DIAGNOSIS — J45909 Unspecified asthma, uncomplicated: Secondary | ICD-10-CM | POA: Insufficient documentation

## 2023-08-10 DIAGNOSIS — I25118 Atherosclerotic heart disease of native coronary artery with other forms of angina pectoris: Secondary | ICD-10-CM | POA: Diagnosis not present

## 2023-08-10 HISTORY — PX: KNEE ARTHROPLASTY: SHX992

## 2023-08-10 HISTORY — DX: Polyp of colon: K63.5

## 2023-08-10 HISTORY — DX: Long term (current) use of aspirin: Z79.82

## 2023-08-10 HISTORY — DX: Unspecified asthma, uncomplicated: J45.909

## 2023-08-10 HISTORY — DX: Hyperlipidemia, unspecified: E78.5

## 2023-08-10 HISTORY — DX: Bilateral inguinal hernia, without obstruction or gangrene, not specified as recurrent: K40.20

## 2023-08-10 HISTORY — DX: Benign neoplasm of colon, unspecified: D12.6

## 2023-08-10 SURGERY — ARTHROPLASTY, KNEE, TOTAL, USING IMAGELESS COMPUTER-ASSISTED NAVIGATION
Anesthesia: Spinal | Site: Knee | Laterality: Right

## 2023-08-10 MED ORDER — BUPIVACAINE HCL (PF) 0.5 % IJ SOLN
INTRAMUSCULAR | Status: DC | PRN
  Administered 2023-08-10: 3 mL

## 2023-08-10 MED ORDER — ACETAMINOPHEN 10 MG/ML IV SOLN
1000.0000 mg | Freq: Four times a day (QID) | INTRAVENOUS | Status: DC
  Administered 2023-08-10 – 2023-08-11 (×3): 1000 mg via INTRAVENOUS
  Filled 2023-08-10 (×3): qty 100

## 2023-08-10 MED ORDER — BUPIVACAINE HCL (PF) 0.25 % IJ SOLN
INTRAMUSCULAR | Status: AC
  Filled 2023-08-10: qty 120

## 2023-08-10 MED ORDER — PROPOFOL 500 MG/50ML IV EMUL
INTRAVENOUS | Status: DC | PRN
  Administered 2023-08-10: 75 ug/kg/min via INTRAVENOUS

## 2023-08-10 MED ORDER — PHENYLEPHRINE HCL (PRESSORS) 10 MG/ML IV SOLN: INTRAVENOUS | Status: DC | PRN

## 2023-08-10 MED ORDER — ALBUTEROL SULFATE (2.5 MG/3ML) 0.083% IN NEBU: 3.0000 mL | INHALATION_SOLUTION | Freq: Four times a day (QID) | RESPIRATORY_TRACT | Status: DC | PRN

## 2023-08-10 MED ORDER — PHENOL 1.4 % MT LIQD: 1.0000 | OROMUCOSAL | Status: DC | PRN

## 2023-08-10 MED ORDER — MELOXICAM 7.5 MG PO TABS
15.0000 mg | ORAL_TABLET | ORAL | Status: DC
  Administered 2023-08-11: 15 mg via ORAL
  Filled 2023-08-10: qty 2

## 2023-08-10 MED ORDER — MAGNESIUM HYDROXIDE 400 MG/5ML PO SUSP
30.0000 mL | Freq: Every day | ORAL | Status: DC
  Administered 2023-08-10: 30 mL via ORAL
  Filled 2023-08-10 (×2): qty 30

## 2023-08-10 MED ORDER — GABAPENTIN 300 MG PO CAPS
300.0000 mg | ORAL_CAPSULE | Freq: Once | ORAL | Status: AC
  Administered 2023-08-10: 300 mg via ORAL

## 2023-08-10 MED ORDER — DEXAMETHASONE SODIUM PHOSPHATE 10 MG/ML IJ SOLN
8.0000 mg | Freq: Once | INTRAMUSCULAR | Status: AC
  Administered 2023-08-10: 8 mg via INTRAVENOUS

## 2023-08-10 MED ORDER — HYDROCHLOROTHIAZIDE 12.5 MG PO TABS
12.5000 mg | ORAL_TABLET | ORAL | Status: DC
  Administered 2023-08-11: 12.5 mg via ORAL
  Filled 2023-08-10: qty 1

## 2023-08-10 MED ORDER — FENTANYL CITRATE (PF) 100 MCG/2ML IJ SOLN
INTRAMUSCULAR | Status: DC | PRN
  Administered 2023-08-10 (×2): 25 ug via INTRAVENOUS

## 2023-08-10 MED ORDER — OXYCODONE HCL 5 MG PO TABS
5.0000 mg | ORAL_TABLET | ORAL | Status: DC | PRN
  Administered 2023-08-10 – 2023-08-11 (×3): 5 mg via ORAL
  Filled 2023-08-10 (×5): qty 1

## 2023-08-10 MED ORDER — OXYCODONE HCL 5 MG/5ML PO SOLN: 5.0000 mg | Freq: Once | ORAL | Status: AC | PRN

## 2023-08-10 MED ORDER — CEFAZOLIN SODIUM-DEXTROSE 2-4 GM/100ML-% IV SOLN
INTRAVENOUS | Status: AC
  Filled 2023-08-10: qty 100

## 2023-08-10 MED ORDER — FLUTICASONE PROPIONATE 50 MCG/ACT NA SUSP
2.0000 | Freq: Every day | NASAL | Status: DC
  Administered 2023-08-10: 2 via NASAL
  Filled 2023-08-10: qty 16

## 2023-08-10 MED ORDER — ROSUVASTATIN CALCIUM 10 MG PO TABS
40.0000 mg | ORAL_TABLET | ORAL | Status: DC
  Administered 2023-08-11: 40 mg via ORAL
  Filled 2023-08-10: qty 4

## 2023-08-10 MED ORDER — ALUM & MAG HYDROXIDE-SIMETH 200-200-20 MG/5ML PO SUSP: 30.0000 mL | ORAL | Status: DC | PRN

## 2023-08-10 MED ORDER — SODIUM CHLORIDE 0.9 % IR SOLN
Status: DC | PRN
  Administered 2023-08-10: 3000 mL

## 2023-08-10 MED ORDER — MIDAZOLAM HCL 2 MG/2ML IJ SOLN
INTRAMUSCULAR | Status: AC
  Filled 2023-08-10: qty 2

## 2023-08-10 MED ORDER — PANTOPRAZOLE SODIUM 40 MG PO TBEC
40.0000 mg | DELAYED_RELEASE_TABLET | Freq: Two times a day (BID) | ORAL | Status: DC
  Administered 2023-08-10 – 2023-08-11 (×3): 40 mg via ORAL
  Filled 2023-08-10 (×3): qty 1

## 2023-08-10 MED ORDER — PROPOFOL 1000 MG/100ML IV EMUL
INTRAVENOUS | Status: AC
  Filled 2023-08-10: qty 100

## 2023-08-10 MED ORDER — ACETAMINOPHEN 325 MG PO TABS: 325.0000 mg | ORAL_TABLET | Freq: Four times a day (QID) | ORAL | Status: DC | PRN

## 2023-08-10 MED ORDER — ALBUTEROL SULFATE (2.5 MG/3ML) 0.083% IN NEBU: 2.5000 mg | INHALATION_SOLUTION | Freq: Four times a day (QID) | RESPIRATORY_TRACT | Status: DC | PRN

## 2023-08-10 MED ORDER — OXYCODONE HCL 5 MG PO TABS
5.0000 mg | ORAL_TABLET | Freq: Once | ORAL | Status: AC | PRN
  Administered 2023-08-10: 5 mg via ORAL

## 2023-08-10 MED ORDER — BISACODYL 10 MG RE SUPP: 10.0000 mg | Freq: Every day | RECTAL | Status: DC | PRN

## 2023-08-10 MED ORDER — HYDROMORPHONE HCL 1 MG/ML IJ SOLN: 0.5000 mg | INTRAMUSCULAR | Status: DC | PRN

## 2023-08-10 MED ORDER — METOCLOPRAMIDE HCL 10 MG PO TABS
ORAL_TABLET | ORAL | Status: AC
  Filled 2023-08-10: qty 1

## 2023-08-10 MED ORDER — METOCLOPRAMIDE HCL 5 MG PO TABS
10.0000 mg | ORAL_TABLET | Freq: Three times a day (TID) | ORAL | Status: DC
  Administered 2023-08-10 – 2023-08-11 (×4): 10 mg via ORAL
  Filled 2023-08-10 (×4): qty 2

## 2023-08-10 MED ORDER — MENTHOL 3 MG MT LOZG: 1.0000 | LOZENGE | OROMUCOSAL | Status: DC | PRN

## 2023-08-10 MED ORDER — CELECOXIB 200 MG PO CAPS
ORAL_CAPSULE | ORAL | Status: AC
  Filled 2023-08-10: qty 2

## 2023-08-10 MED ORDER — FENTANYL CITRATE (PF) 100 MCG/2ML IJ SOLN
INTRAMUSCULAR | Status: AC
  Filled 2023-08-10: qty 2

## 2023-08-10 MED ORDER — CELECOXIB 200 MG PO CAPS
400.0000 mg | ORAL_CAPSULE | Freq: Once | ORAL | Status: AC
  Administered 2023-08-10: 400 mg via ORAL

## 2023-08-10 MED ORDER — LOSARTAN POTASSIUM 50 MG PO TABS
100.0000 mg | ORAL_TABLET | ORAL | Status: DC
  Administered 2023-08-11: 100 mg via ORAL
  Filled 2023-08-10: qty 2

## 2023-08-10 MED ORDER — FLUTICASONE FUROATE-VILANTEROL 100-25 MCG/ACT IN AEPB
1.0000 | INHALATION_SPRAY | Freq: Every day | RESPIRATORY_TRACT | Status: DC
  Administered 2023-08-11: 1 via RESPIRATORY_TRACT
  Filled 2023-08-10: qty 28

## 2023-08-10 MED ORDER — ACETAMINOPHEN 10 MG/ML IV SOLN
INTRAVENOUS | Status: AC
  Filled 2023-08-10: qty 100

## 2023-08-10 MED ORDER — OXYCODONE HCL 5 MG PO TABS
10.0000 mg | ORAL_TABLET | ORAL | Status: DC | PRN
  Administered 2023-08-10: 10 mg via ORAL

## 2023-08-10 MED ORDER — PHENYLEPHRINE 80 MCG/ML (10ML) SYRINGE FOR IV PUSH (FOR BLOOD PRESSURE SUPPORT)
PREFILLED_SYRINGE | INTRAVENOUS | Status: DC | PRN
  Administered 2023-08-10: 160 ug via INTRAVENOUS
  Administered 2023-08-10 (×2): 80 ug via INTRAVENOUS

## 2023-08-10 MED ORDER — ONDANSETRON HCL 4 MG/2ML IJ SOLN: 4.0000 mg | Freq: Four times a day (QID) | INTRAMUSCULAR | Status: DC | PRN

## 2023-08-10 MED ORDER — CEFAZOLIN SODIUM-DEXTROSE 2-4 GM/100ML-% IV SOLN
2.0000 g | Freq: Four times a day (QID) | INTRAVENOUS | Status: AC
  Administered 2023-08-10 (×2): 2 g via INTRAVENOUS
  Filled 2023-08-10 (×2): qty 100

## 2023-08-10 MED ORDER — ASPIRIN 81 MG PO CHEW
81.0000 mg | CHEWABLE_TABLET | Freq: Two times a day (BID) | ORAL | Status: DC
  Administered 2023-08-10 – 2023-08-11 (×2): 81 mg via ORAL
  Filled 2023-08-10 (×2): qty 1

## 2023-08-10 MED ORDER — IPRATROPIUM-ALBUTEROL 0.5-2.5 (3) MG/3ML IN SOLN
3.0000 mL | Freq: Two times a day (BID) | RESPIRATORY_TRACT | Status: DC
  Administered 2023-08-10 – 2023-08-11 (×2): 3 mL via RESPIRATORY_TRACT
  Filled 2023-08-10 (×3): qty 3

## 2023-08-10 MED ORDER — UMECLIDINIUM BROMIDE 62.5 MCG/ACT IN AEPB
1.0000 | INHALATION_SPRAY | Freq: Every day | RESPIRATORY_TRACT | Status: DC
  Administered 2023-08-11: 1 via RESPIRATORY_TRACT
  Filled 2023-08-10: qty 7

## 2023-08-10 MED ORDER — DIPHENHYDRAMINE HCL 12.5 MG/5ML PO ELIX: 12.5000 mg | ORAL_SOLUTION | ORAL | Status: DC | PRN

## 2023-08-10 MED ORDER — CHLORHEXIDINE GLUCONATE 0.12 % MT SOLN
15.0000 mL | Freq: Once | OROMUCOSAL | Status: AC
  Administered 2023-08-10: 15 mL via OROMUCOSAL

## 2023-08-10 MED ORDER — PROPOFOL 10 MG/ML IV BOLUS
INTRAVENOUS | Status: AC
  Filled 2023-08-10: qty 20

## 2023-08-10 MED ORDER — GABAPENTIN 300 MG PO CAPS
ORAL_CAPSULE | ORAL | Status: AC
  Filled 2023-08-10: qty 1

## 2023-08-10 MED ORDER — ONDANSETRON HCL 4 MG PO TABS: 4.0000 mg | ORAL_TABLET | Freq: Four times a day (QID) | ORAL | Status: DC | PRN

## 2023-08-10 MED ORDER — TRAMADOL HCL 50 MG PO TABS
50.0000 mg | ORAL_TABLET | ORAL | Status: DC | PRN
  Administered 2023-08-10: 50 mg via ORAL
  Filled 2023-08-10: qty 2

## 2023-08-10 MED ORDER — PHENYLEPHRINE HCL-NACL 20-0.9 MG/250ML-% IV SOLN
INTRAVENOUS | Status: DC | PRN
  Administered 2023-08-10: 50 ug/min via INTRAVENOUS

## 2023-08-10 MED ORDER — DEXAMETHASONE SODIUM PHOSPHATE 10 MG/ML IJ SOLN
INTRAMUSCULAR | Status: AC
  Filled 2023-08-10: qty 1

## 2023-08-10 MED ORDER — TRAMADOL HCL 50 MG PO TABS
ORAL_TABLET | ORAL | Status: AC
  Filled 2023-08-10: qty 1

## 2023-08-10 MED ORDER — SODIUM CHLORIDE 0.9 % IV SOLN: INTRAVENOUS | Status: DC

## 2023-08-10 MED ORDER — PHENYLEPHRINE HCL-NACL 20-0.9 MG/250ML-% IV SOLN
INTRAVENOUS | Status: AC
  Filled 2023-08-10: qty 250

## 2023-08-10 MED ORDER — TRANEXAMIC ACID-NACL 1000-0.7 MG/100ML-% IV SOLN
INTRAVENOUS | Status: AC
  Filled 2023-08-10: qty 100

## 2023-08-10 MED ORDER — SURGIPHOR WOUND IRRIGATION SYSTEM - OPTIME: TOPICAL | Status: DC | PRN

## 2023-08-10 MED ORDER — FLEET ENEMA RE ENEM: 1.0000 | ENEMA | Freq: Once | RECTAL | Status: DC | PRN

## 2023-08-10 MED ORDER — SODIUM CHLORIDE (PF) 0.9 % IJ SOLN
INTRAMUSCULAR | Status: DC | PRN
  Administered 2023-08-10: 120 mL via INTRAMUSCULAR

## 2023-08-10 MED ORDER — SENNOSIDES-DOCUSATE SODIUM 8.6-50 MG PO TABS
1.0000 | ORAL_TABLET | Freq: Two times a day (BID) | ORAL | Status: DC
  Administered 2023-08-10 – 2023-08-11 (×3): 1 via ORAL
  Filled 2023-08-10 (×3): qty 1

## 2023-08-10 MED ORDER — FERROUS SULFATE 325 (65 FE) MG PO TABS
325.0000 mg | ORAL_TABLET | Freq: Two times a day (BID) | ORAL | Status: DC
  Administered 2023-08-10 – 2023-08-11 (×2): 325 mg via ORAL
  Filled 2023-08-10 (×2): qty 1

## 2023-08-10 MED ORDER — TRANEXAMIC ACID-NACL 1000-0.7 MG/100ML-% IV SOLN
1000.0000 mg | Freq: Once | INTRAVENOUS | Status: AC
  Administered 2023-08-10: 1000 mg via INTRAVENOUS

## 2023-08-10 MED ORDER — SENNA 8.6 MG PO TABS
ORAL_TABLET | ORAL | Status: AC
  Filled 2023-08-10: qty 1

## 2023-08-10 MED ORDER — CEFAZOLIN SODIUM-DEXTROSE 2-4 GM/100ML-% IV SOLN
2.0000 g | INTRAVENOUS | Status: AC
  Administered 2023-08-10: 2 g via INTRAVENOUS

## 2023-08-10 MED ORDER — ACETAMINOPHEN 10 MG/ML IV SOLN
INTRAVENOUS | Status: DC | PRN
Start: 2023-08-10 — End: 2023-08-10
  Administered 2023-08-10: 1000 mg via INTRAVENOUS

## 2023-08-10 MED ORDER — CHLORHEXIDINE GLUCONATE 4 % EX SOLN
60.0000 mL | Freq: Once | CUTANEOUS | Status: DC
  Administered 2023-08-10: 4 via TOPICAL

## 2023-08-10 MED ORDER — TRANEXAMIC ACID-NACL 1000-0.7 MG/100ML-% IV SOLN
1000.0000 mg | INTRAVENOUS | Status: AC
  Administered 2023-08-10: 1000 mg via INTRAVENOUS

## 2023-08-10 MED ORDER — LOSARTAN POTASSIUM-HCTZ 100-12.5 MG PO TABS: 1.0000 | ORAL_TABLET | ORAL | Status: DC

## 2023-08-10 MED ORDER — MIDAZOLAM HCL 5 MG/5ML IJ SOLN
INTRAMUSCULAR | Status: DC | PRN
  Administered 2023-08-10: 1 mg via INTRAVENOUS

## 2023-08-10 MED ORDER — LACTATED RINGERS IV SOLN: INTRAVENOUS | Status: DC

## 2023-08-10 MED ORDER — AMLODIPINE BESYLATE 10 MG PO TABS
10.0000 mg | ORAL_TABLET | ORAL | Status: DC
  Administered 2023-08-11: 10 mg via ORAL
  Filled 2023-08-10: qty 1

## 2023-08-10 MED ORDER — ORAL CARE MOUTH RINSE: 15.0000 mL | Freq: Once | OROMUCOSAL | Status: AC

## 2023-08-10 MED ORDER — FENTANYL CITRATE (PF) 100 MCG/2ML IJ SOLN
25.0000 ug | INTRAMUSCULAR | Status: DC | PRN
  Administered 2023-08-10: 25 ug via INTRAVENOUS
  Administered 2023-08-10: 50 ug via INTRAVENOUS
  Administered 2023-08-10: 25 ug via INTRAVENOUS

## 2023-08-10 MED ORDER — OXYCODONE HCL 5 MG PO TABS
ORAL_TABLET | ORAL | Status: AC
  Filled 2023-08-10: qty 1

## 2023-08-10 MED ORDER — CEFAZOLIN SODIUM-DEXTROSE 2-4 GM/100ML-% IV SOLN: 2.0000 g | Freq: Four times a day (QID) | INTRAVENOUS | Status: DC

## 2023-08-10 MED ORDER — BUPIVACAINE LIPOSOME 1.3 % IJ SUSP
INTRAMUSCULAR | Status: AC
  Filled 2023-08-10: qty 40

## 2023-08-10 MED ORDER — CHLORHEXIDINE GLUCONATE 0.12 % MT SOLN
OROMUCOSAL | Status: AC
  Filled 2023-08-10: qty 15

## 2023-08-10 MED ORDER — ENSURE PRE-SURGERY PO LIQD
296.0000 mL | Freq: Once | ORAL | Status: AC
  Administered 2023-08-10: 296 mL via ORAL
  Filled 2023-08-10: qty 296

## 2023-08-10 MED ORDER — SENNOSIDES-DOCUSATE SODIUM 8.6-50 MG PO TABS
ORAL_TABLET | ORAL | Status: AC
  Filled 2023-08-10: qty 1

## 2023-08-10 SURGICAL SUPPLY — 78 items
ATTUNE MED DOME PAT 38 KNEE (Knees) IMPLANT
ATTUNE PS FEM RT SZ 6 CEM KNEE (Femur) IMPLANT
ATTUNE PSRP INSR SZ6 5 KNEE (Insert) IMPLANT
BASE TIBIAL ROT PLAT SZ 8 KNEE (Knees) IMPLANT
BATTERY INSTRU NAVIGATION (MISCELLANEOUS) ×4 IMPLANT
BIT DRILL QUICK REL 1/8 2PK SL (BIT) ×1 IMPLANT
BLADE CLIPPER SURG (BLADE) IMPLANT
BLADE SAW 70X12.5 (BLADE) ×1 IMPLANT
BLADE SAW 90X13X1.19 OSCILLAT (BLADE) ×1 IMPLANT
BLADE SAW 90X25X1.19 OSCILLAT (BLADE) ×1 IMPLANT
BONE CEMENT GENTAMICIN (Cement) ×2 IMPLANT
BRUSH SCRUB EZ PLAIN DRY (MISCELLANEOUS) ×1 IMPLANT
BSPLAT TIB 8 CMNT ROT PLAT STR (Knees) ×1 IMPLANT
BTRY SRG DRVR LF (MISCELLANEOUS) ×4
CEMENT BONE GENTAMICIN 40 (Cement) IMPLANT
COOLER POLAR GLACIER W/PUMP (MISCELLANEOUS) ×1 IMPLANT
CUFF TOURN SGL QUICK 24 (TOURNIQUET CUFF) ×1
CUFF TOURN SGL QUICK 30 (TOURNIQUET CUFF)
CUFF TRNQT CYL 24X4X16.5-23 (TOURNIQUET CUFF) IMPLANT
CUFF TRNQT CYL 30X4X21-28X (TOURNIQUET CUFF) IMPLANT
DRAPE INCISE IOBAN 66X45 STRL (DRAPES) IMPLANT
DRAPE SHEET LG 3/4 BI-LAMINATE (DRAPES) ×1 IMPLANT
DRSG AQUACEL AG ADV 3.5X14 (GAUZE/BANDAGES/DRESSINGS) ×1 IMPLANT
DRSG MEPILEX SACRM 8.7X9.8 (GAUZE/BANDAGES/DRESSINGS) ×1 IMPLANT
DRSG TEGADERM 4X4.75 (GAUZE/BANDAGES/DRESSINGS) ×1 IMPLANT
DURAPREP 26ML APPLICATOR (WOUND CARE) ×2 IMPLANT
ELECT CAUTERY BLADE 6.4 (BLADE) ×1 IMPLANT
ELECT REM PT RETURN 9FT ADLT (ELECTROSURGICAL) ×1
ELECTRODE REM PT RTRN 9FT ADLT (ELECTROSURGICAL) ×1 IMPLANT
EVACUATOR 1/8 PVC DRAIN (DRAIN) ×1 IMPLANT
EX-PIN ORTHOLOCK NAV 4X150 (PIN) ×2 IMPLANT
GAUZE XEROFORM 1X8 LF (GAUZE/BANDAGES/DRESSINGS) ×1 IMPLANT
GLOVE BIOGEL M STRL SZ7.5 (GLOVE) ×4 IMPLANT
GLOVE SRG 8 PF TXTR STRL LF DI (GLOVE) ×2 IMPLANT
GLOVE SURG UNDER POLY LF SZ8 (GLOVE) ×2
GOWN STRL REUS W/ TWL LRG LVL3 (GOWN DISPOSABLE) ×1 IMPLANT
GOWN STRL REUS W/ TWL XL LVL3 (GOWN DISPOSABLE) ×1 IMPLANT
GOWN STRL REUS W/TWL LRG LVL3 (GOWN DISPOSABLE) ×1
GOWN STRL REUS W/TWL XL LVL3 (GOWN DISPOSABLE) ×1
GOWN TOGA ZIPPER T7+ PEEL AWAY (MISCELLANEOUS) ×1 IMPLANT
HANDLE YANKAUER SUCT OPEN TIP (MISCELLANEOUS) ×1 IMPLANT
HOLDER FOLEY CATH W/STRAP (MISCELLANEOUS) ×1 IMPLANT
HOOD PEEL AWAY T7 (MISCELLANEOUS) ×1 IMPLANT
IV NS IRRIG 3000ML ARTHROMATIC (IV SOLUTION) ×1 IMPLANT
KIT TURNOVER KIT A (KITS) ×1 IMPLANT
KNIFE SCULPS 14X20 (INSTRUMENTS) ×1 IMPLANT
MANIFOLD NEPTUNE II (INSTRUMENTS) ×2 IMPLANT
NDL SPNL 20GX3.5 QUINCKE YW (NEEDLE) ×2 IMPLANT
NEEDLE SPNL 20GX3.5 QUINCKE YW (NEEDLE) ×2 IMPLANT
PACK TOTAL KNEE (MISCELLANEOUS) ×1 IMPLANT
PAD ABD DERMACEA PRESS 5X9 (GAUZE/BANDAGES/DRESSINGS) ×2 IMPLANT
PAD ARMBOARD 7.5X6 YLW CONV (MISCELLANEOUS) ×3 IMPLANT
PAD WRAPON POLAR KNEE (MISCELLANEOUS) ×1 IMPLANT
PENCIL SMOKE EVACUATOR COATED (MISCELLANEOUS) ×1 IMPLANT
PIN DRILL FIX HALF THREAD (BIT) ×2 IMPLANT
PIN FIXATION 1/8DIA X 3INL (PIN) ×1 IMPLANT
PULSAVAC PLUS IRRIG FAN TIP (DISPOSABLE) ×1
SOL PREP PVP 2OZ (MISCELLANEOUS)
SOLUTION IRRIG SURGIPHOR (IV SOLUTION) ×1 IMPLANT
SOLUTION PREP PVP 2OZ (MISCELLANEOUS) ×1 IMPLANT
SPONGE DRAIN TRACH 4X4 STRL 2S (GAUZE/BANDAGES/DRESSINGS) ×1 IMPLANT
STAPLER SKIN PROX 35W (STAPLE) ×1 IMPLANT
STOCKINETTE IMPERV 14X48 (MISCELLANEOUS) ×1 IMPLANT
STRAP TIBIA SHORT (MISCELLANEOUS) ×1 IMPLANT
SUCTION TUBE FRAZIER 10FR DISP (SUCTIONS) ×1 IMPLANT
SUT VIC AB 0 CT1 36 (SUTURE) ×1 IMPLANT
SUT VIC AB 1 CT1 36 (SUTURE) ×2 IMPLANT
SUT VIC AB 2-0 CT2 27 (SUTURE) ×1 IMPLANT
SYR 30ML LL (SYRINGE) ×2 IMPLANT
TIBIAL BASE ROT PLAT SZ 8 KNEE (Knees) ×1 IMPLANT
TIP FAN IRRIG PULSAVAC PLUS (DISPOSABLE) ×1 IMPLANT
TOWEL OR 17X26 4PK STRL BLUE (TOWEL DISPOSABLE) IMPLANT
TOWER CARTRIDGE SMART MIX (DISPOSABLE) ×1 IMPLANT
TRAP FLUID SMOKE EVACUATOR (MISCELLANEOUS) ×1 IMPLANT
TRAY FOLEY MTR SLVR 16FR STAT (SET/KITS/TRAYS/PACK) ×1 IMPLANT
TUBING CONNECTING 10 (TUBING) ×2 IMPLANT
WATER STERILE IRR 1000ML POUR (IV SOLUTION) ×1 IMPLANT
WRAPON POLAR PAD KNEE (MISCELLANEOUS) ×1

## 2023-08-10 NOTE — Evaluation (Signed)
Physical Therapy Evaluation Patient Details Name: Joe Dillon MRN: [id number] DOB: 13-Aug-1956 Today's Date: 08/10/2023  History of Present Illness  Pt is a 67 yo M diagnosed with degenerative arthrosis of the right knee and is s/p elective R TKA.  PMH includes COPD, HTN, and CAD.   Clinical Impression  Pt was pleasant and motivated to participate during the session and put forth good effort throughout. Pt required no physical assistance during the session, only min extra time, effort, and occasional cuing for proper sequencing during transfers and gait.  Pt was mildly antalgic on the R knee with WB activities but grossly steady with no overt LOB or R knee buckling and reported no adverse symptoms.  Pt's SpO2 was >/= 92% during the session on room air with HR WNL.  Pt will benefit from continued PT services upon discharge to safely address deficits listed in patient problem list for decreased caregiver assistance and eventual return to PLOF.          If plan is discharge home, recommend the following: A little help with walking and/or transfers;A little help with bathing/dressing/bathroom;Assistance with cooking/housework;Assist for transportation;Help with stairs or ramp for entrance   Can travel by private vehicle        Equipment Recommendations None recommended by PT  Recommendations for Other Services       Functional Status Assessment Patient has had a recent decline in their functional status and demonstrates the ability to make significant improvements in function in a reasonable and predictable amount of time.     Precautions / Restrictions Precautions Precautions: Fall Restrictions Weight Bearing Restrictions: Yes RLE Weight Bearing: Weight bearing as tolerated      Mobility  Bed Mobility Overal bed mobility: Modified Independent             General bed mobility comments: Min extra time and effort only    Transfers Overall transfer level: Needs  assistance Equipment used: Rolling walker (2 wheels) Transfers: Sit to/from Stand Sit to Stand: Contact guard assist           General transfer comment: Min verbal and visual cues for sequencing    Ambulation/Gait Ambulation/Gait assistance: Contact guard assist Gait Distance (Feet): 5 Feet Assistive device: Rolling walker (2 wheels) Gait Pattern/deviations: Step-to pattern, Decreased step length - left, Decreased stance time - right, Antalgic Gait velocity: decreased     General Gait Details: Good control and stability throughout, only mildly antalgic on the RLE, no overt LOB or buckling noted  Stairs            Wheelchair Mobility     Tilt Bed    Modified Rankin (Stroke Patients Only)       Balance Overall balance assessment: Needs assistance   Sitting balance-Leahy Scale: Normal     Standing balance support: Bilateral upper extremity supported, During functional activity Standing balance-Leahy Scale: Good                               Pertinent Vitals/Pain Pain Assessment Pain Assessment: 0-10 Pain Score: 3  Pain Location: R knee Pain Descriptors / Indicators: Sore, Burning Pain Intervention(s): Monitored during session, Premedicated before session, Repositioned, Ice applied    Home Living Family/patient expects to be discharged to:: Private residence Living Arrangements: Spouse/significant other Available Help at Discharge: Family;Available 24 hours/day Type of Home: House Home Access: Stairs to enter Entrance Stairs-Rails: Left Entrance Stairs-Number of Steps: 2  Home Layout: One level Home Equipment: Agricultural consultant (2 wheels);BSC/3in1;Shower seat      Prior Function Prior Level of Function : Independent/Modified Independent             Mobility Comments: Ind amb community distances without an AD, plays golf regularly, no fall history ADLs Comments: Ind with ADLs     Extremity/Trunk Assessment   Upper Extremity  Assessment Upper Extremity Assessment: Overall WFL for tasks assessed    Lower Extremity Assessment Lower Extremity Assessment: Generalized weakness;RLE deficits/detail RLE Deficits / Details: BLE ankle strength, AROM, and sensation to light touch grossly intact; R hip flex strength >/= 3/5, able to perform Ind SLR RLE: Unable to fully assess due to pain RLE Sensation: WNL RLE Coordination: WNL       Communication   Communication Communication: No apparent difficulties Cueing Techniques: Verbal cues;Visual cues  Cognition Arousal: Alert Behavior During Therapy: WFL for tasks assessed/performed Overall Cognitive Status: Within Functional Limits for tasks assessed                                          General Comments      Exercises Total Joint Exercises Ankle Circles/Pumps: AROM, Strengthening, Both, 10 reps Quad Sets: AROM, Strengthening, Right, 10 reps Straight Leg Raises: AROM, Strengthening, Right, 5 reps Long Arc Quad: AROM, Strengthening, Right, 10 reps Knee Flexion: AROM, Strengthening, Right, 10 reps Goniometric ROM: R knee AROM: 11-84 deg Marching in Standing: AROM, Strengthening, Both, 5 reps, Standing Other Exercises Other Exercises: Positioning education to encourage R knee ext PROM and prevent heel pressure Other Exercises: HEP education per handout   Assessment/Plan    PT Assessment Patient needs continued PT services  PT Problem List Decreased strength;Decreased range of motion;Decreased activity tolerance;Decreased balance;Decreased mobility;Decreased knowledge of use of DME;Pain       PT Treatment Interventions DME instruction;Gait training;Stair training;Functional mobility training;Therapeutic activities;Therapeutic exercise;Balance training;Patient/family education    PT Goals (Current goals can be found in the Care Plan section)  Acute Rehab PT Goals Patient Stated Goal: To get back to playing golf PT Goal Formulation: With  patient Time For Goal Achievement: 08/23/23 Potential to Achieve Goals: Good    Frequency BID     Co-evaluation               AM-PAC PT "6 Clicks" Mobility  Outcome Measure Help needed turning from your back to your side while in a flat bed without using bedrails?: A Little Help needed moving from lying on your back to sitting on the side of a flat bed without using bedrails?: A Little Help needed moving to and from a bed to a chair (including a wheelchair)?: A Little Help needed standing up from a chair using your arms (e.g., wheelchair or bedside chair)?: A Little Help needed to walk in hospital room?: A Little Help needed climbing 3-5 steps with a railing? : A Lot 6 Click Score: 17    End of Session Equipment Utilized During Treatment: Gait belt Activity Tolerance: Patient tolerated treatment well Patient left: in chair;with call bell/phone within reach;with chair alarm set;with family/visitor present;with SCD's reapplied;Other (comment) (polar care donned to R knee) Nurse Communication: Mobility status;Weight bearing status PT Visit Diagnosis: Other abnormalities of gait and mobility (R26.89);Muscle weakness (generalized) (M62.81);Pain Pain - Right/Left: Right Pain - part of body: Knee    Time: 4098-1191 PT Time Calculation (min) (ACUTE ONLY):  37 min   Charges:   PT Evaluation $PT Eval Moderate Complexity: 1 Mod PT Treatments $Therapeutic Exercise: 8-22 mins PT General Charges $$ ACUTE PT VISIT: 1 Visit       D. Elly Modena PT, DPT 08/10/23, 4:53 PM

## 2023-08-10 NOTE — Progress Notes (Signed)
Patient is not able to walk the distance required to go the bathroom, or he/she is unable to safely negotiate stairs required to access the bathroom.  A 3in1 BSC will alleviate this problem   Joe Dillon, [name] M.D.  

## 2023-08-10 NOTE — Op Note (Signed)
OPERATIVE NOTE  DATE OF SURGERY:  08/10/2023  PATIENT NAME:  ZYMARION DELPRADO   DOB: 1955/11/09  MRN: [id number]  PRE-OPERATIVE DIAGNOSIS: Degenerative arthrosis of the right knee, primary  POST-OPERATIVE DIAGNOSIS:  Same  PROCEDURE:  Right total knee arthroplasty using computer-assisted navigation  SURGEON:  Jena Gauss. M.D.  ASSISTANT:  Gean Birchwood, PA-C (present and scrubbed throughout the case, critical for assistance with exposure, retraction, instrumentation, and closure)  ANESTHESIA: spinal  ESTIMATED BLOOD LOSS: 50 mL  FLUIDS REPLACED: 1000 mL of crystalloid  TOURNIQUET TIME: 83 minutes  DRAINS: 2 medium Hemovac drains  SOFT TISSUE RELEASES: Anterior cruciate ligament, posterior cruciate ligament, deep medial collateral ligament, patellofemoral ligament  IMPLANTS UTILIZED: DePuy Attune size 6 posterior stabilized femoral component (cemented), size 8 rotating platform tibial component (cemented), 38 mm medialized dome patella (cemented), and a 5 mm stabilized rotating platform polyethylene insert.  INDICATIONS FOR SURGERY: JSUTIN BIEKER is a 67 y.o. year old male with a long history of progressive knee pain. X-rays demonstrated severe degenerative changes in tricompartmental fashion. The patient had not seen any significant improvement despite conservative nonsurgical intervention. After discussion of the risks and benefits of surgical intervention, the patient expressed understanding of the risks benefits and agree with plans for total knee arthroplasty.   The risks, benefits, and alternatives were discussed at length including but not limited to the risks of infection, bleeding, nerve injury, stiffness, blood clots, the need for revision surgery, cardiopulmonary complications, among others, and they were willing to proceed.  PROCEDURE IN DETAIL: The patient was brought into the operating room and, after adequate spinal anesthesia was achieved, a tourniquet was  placed on the patient's upper thigh. The patient's knee and leg were cleaned and prepped with alcohol and DuraPrep and draped in the usual sterile fashion. A "timeout" was performed as per usual protocol. The lower extremity was exsanguinated using an Esmarch, and the tourniquet was inflated to 300 mmHg. An anterior longitudinal incision was made followed by a standard mid vastus approach. The deep fibers of the medial collateral ligament were elevated in a subperiosteal fashion off of the medial flare of the tibia so as to maintain a continuous soft tissue sleeve. The patella was subluxed laterally and the patellofemoral ligament was incised. Inspection of the knee demonstrated severe degenerative changes with full-thickness loss of articular cartilage. Osteophytes were debrided using a rongeur. Anterior and posterior cruciate ligaments were excised. Two 4.0 mm Schanz pins were inserted in the femur and into the tibia for attachment of the array of trackers used for computer-assisted navigation. Hip center was identified using a circumduction technique. Distal landmarks were mapped using the computer. The distal femur and proximal tibia were mapped using the computer. The distal femoral cutting guide was positioned using computer-assisted navigation so as to achieve a 5 distal valgus cut. The femur was sized and it was felt that a size 6 femoral component was appropriate. A size 6 femoral cutting guide was positioned and the anterior cut was performed and verified using the computer. This was followed by completion of the posterior and chamfer cuts. Femoral cutting guide for the central box was then positioned in the center box cut was performed.  Attention was then directed to the proximal tibia. Medial and lateral menisci were excised. The extramedullary tibial cutting guide was positioned using computer-assisted navigation so as to achieve a 0 varus-valgus alignment and 3 posterior slope. The cut was  performed and verified using the computer. The proximal tibia  was sized and it was felt that a size 8 tibial tray was appropriate. Tibial and femoral trials were inserted followed by insertion of a 5 mm polyethylene insert. This allowed for excellent mediolateral soft tissue balancing both in flexion and in full extension. Finally, the patella was cut and prepared so as to accommodate a 38 mm medialized dome patella. A patella trial was placed and the knee was placed through a range of motion with excellent patellar tracking appreciated. The femoral trial was removed after debridement of posterior osteophytes. The central post-hole for the tibial component was reamed followed by insertion of a keel punch. Tibial trials were then removed. Cut surfaces of bone were irrigated with copious amounts of normal saline using pulsatile lavage and then suctioned dry. Polymethylmethacrylate cement with gentamicin was prepared in the usual fashion using a vacuum mixer. Cement was applied to the cut surface of the proximal tibia as well as along the undersurface of a size 8 rotating platform tibial component. Tibial component was positioned and impacted into place. Excess cement was removed using Personal assistant. Cement was then applied to the cut surfaces of the femur as well as along the posterior flanges of the size 6 femoral component. The femoral component was positioned and impacted into place. Excess cement was removed using Personal assistant. A 5 mm polyethylene trial was inserted and the knee was brought into full extension with steady axial compression applied. Finally, cement was applied to the backside of a 38 mm medialized dome patella and the patellar component was positioned and patellar clamp applied. Excess cement was removed using Personal assistant. After adequate curing of the cement, the tourniquet was deflated after a total tourniquet time of 83 minutes. Hemostasis was achieved using electrocautery. The knee was  irrigated with copious amounts of normal saline using pulsatile lavage followed by 450 ml of Surgiphor and then suctioned dry. 20 mL of 1.3% Exparel and 60 mL of 0.25% Marcaine in 40 mL of normal saline was injected along the posterior capsule, medial and lateral gutters, and along the arthrotomy site. A 5 mm stabilized rotating platform polyethylene insert was inserted and the knee was placed through a range of motion with excellent mediolateral soft tissue balancing appreciated and excellent patellar tracking noted. 2 medium drains were placed in the wound bed and brought out through separate stab incisions. The medial parapatellar portion of the incision was reapproximated using interrupted sutures of #1 Vicryl. Subcutaneous tissue was approximated in layers using first #0 Vicryl followed #2-0 Vicryl. The skin was approximated with skin staples. A sterile dressing was applied.  The patient tolerated the procedure well and was transported to the recovery room in stable condition.    Synia Douglass P. Angie Fava., M.D.

## 2023-08-10 NOTE — Interval H&P Note (Signed)
History and Physical Interval Note:  08/10/2023 6:31 AM  Joe Dillon  has presented today for surgery, with the diagnosis of PRIMARY OSTEOARTHRITIS OF RIGHT KNEE..  The various methods of treatment have been discussed with the patient and family. After consideration of risks, benefits and other options for treatment, the patient has consented to  Procedure(s): COMPUTER ASSISTED TOTAL KNEE ARTHROPLASTY - RNFA (Right) as a surgical intervention.  The patient's history has been reviewed, patient examined, no change in status, stable for surgery.  I have reviewed the patient's chart and labs.  Questions were answered to the patient's satisfaction.     Briceida Rasberry P Anupama Piehl

## 2023-08-10 NOTE — Anesthesia Procedure Notes (Signed)
Spinal  Patient location during procedure: OR Start time: 08/10/2023 7:15 AM End time: 08/10/2023 7:20 AM Reason for block: surgical anesthesia Staffing Performed: resident/CRNA  Anesthesiologist: Piscitello, Cleda Mccreedy, MD Resident/CRNA: Berniece Pap, CRNA Performed by: Berniece Pap, CRNA Authorized by: Rosaria Ferries, MD   Preanesthetic Checklist Completed: patient identified, IV checked, site marked, risks and benefits discussed, surgical consent, monitors and equipment checked, pre-op evaluation and timeout performed Spinal Block Patient position: sitting Prep: DuraPrep Patient monitoring: heart rate, cardiac monitor, continuous pulse ox and blood pressure Approach: midline Location: L3-4 Injection technique: single-shot Needle Needle type: Sprotte  Needle gauge: 24 G Needle length: 9 cm Assessment Sensory level: T4 Events: CSF return

## 2023-08-10 NOTE — Transfer of Care (Signed)
Immediate Anesthesia Transfer of Care Note  Patient: Joe Dillon  Procedure(s) Performed: COMPUTER ASSISTED TOTAL KNEE ARTHROPLASTY (Right: Knee)  Patient Location: PACU  Anesthesia Type:Spinal  Level of Consciousness: awake and alert   Airway & Oxygen Therapy: Patient Spontanous Breathing and Patient connected to face mask oxygen  Post-op Assessment: Report given to RN and Post -op Vital signs reviewed and stable  Post vital signs: Reviewed and stable  Last Vitals:  Vitals Value Taken Time  BP 115/80 08/10/23 1046  Temp    Pulse 73 08/10/23 1051  Resp 18 08/10/23 1051  SpO2 97 % 08/10/23 1051  Vitals shown include unfiled device data.  Last Pain:  Vitals:   08/10/23 0635  TempSrc: Temporal  PainSc: 0-No pain      Patients Stated Pain Goal: 0 (08/10/23 1610)  Complications: No notable events documented.
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
## 2023-08-11 DIAGNOSIS — M1711 Unilateral primary osteoarthritis, right knee: Secondary | ICD-10-CM | POA: Diagnosis not present

## 2023-08-11 MED ORDER — ASPIRIN 81 MG PO CHEW: 81.0000 mg | CHEWABLE_TABLET | Freq: Two times a day (BID) | ORAL | Status: AC

## 2023-08-11 MED ORDER — TRAMADOL HCL 50 MG PO TABS: 50.0000 mg | ORAL_TABLET | ORAL | 0 refills | Status: DC | PRN

## 2023-08-11 MED ORDER — OXYCODONE HCL 5 MG PO TABS: 5.0000 mg | ORAL_TABLET | ORAL | 0 refills | Status: DC | PRN
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
## 2023-08-11 NOTE — Anesthesia Postprocedure Evaluation (Signed)
Anesthesia Post Note  Patient: Traven D Foulk  Procedure(s) Performed: COMPUTER ASSISTED TOTAL KNEE ARTHROPLASTY (Right: Knee)  Patient location during evaluation: PACU Anesthesia Type: Spinal Level of consciousness: oriented and awake and alert Pain management: pain level controlled Vital Signs Assessment: post-procedure vital signs reviewed and stable Respiratory status: spontaneous breathing, respiratory function stable and patient connected to nasal cannula oxygen Cardiovascular status: blood pressure returned to baseline and stable Postop Assessment: no headache, no backache and no apparent nausea or vomiting Anesthetic complications: no   No notable events documented.   Last Vitals:  Vitals:   08/11/23 0503 08/11/23 0815  BP: 124/79 128/72  Pulse: 69 75  Resp: 20 16  Temp: 37.2 C 36.7 C  SpO2: 93% 94%    Last Pain:  Vitals:   08/11/23 0700  TempSrc:   PainSc: 2                  Corinda Gubler

## 2023-08-11 NOTE — Plan of Care (Signed)
  Problem: Education: Goal: Understanding of CV disease, CV risk reduction, and recovery process will improve Outcome: Progressing Goal: Individualized Educational Video(s) Outcome: Progressing   Problem: Activity: Goal: Ability to return to baseline activity level will improve Outcome: Progressing   Problem: Cardiovascular: Goal: Ability to achieve and maintain adequate cardiovascular perfusion will improve Outcome: Progressing Goal: Vascular access site(s) Level 0-1 will be maintained Outcome: Progressing   Problem: Health Behavior/Discharge Planning: Goal: Ability to safely manage health-related needs after discharge will improve Outcome: Progressing   Problem: Education: Goal: Knowledge of the prescribed therapeutic regimen will improve Outcome: Progressing Goal: Individualized Educational Video(s) Outcome: Progressing   Problem: Activity: Goal: Ability to avoid complications of mobility impairment will improve Outcome: Progressing Goal: Range of joint motion will improve Outcome: Progressing   Problem: Clinical Measurements: Goal: Postoperative complications will be avoided or minimized Outcome: Progressing   Problem: Pain Management: Goal: Pain level will decrease with appropriate interventions Outcome: Progressing   Problem: Skin Integrity: Goal: Will show signs of wound healing Outcome: Progressing   Problem: Education: Goal: Knowledge of General Education information will improve Description: Including pain rating scale, medication(s)/side effects and non-pharmacologic comfort measures Outcome: Progressing   Problem: Health Behavior/Discharge Planning: Goal: Ability to manage health-related needs will improve Outcome: Progressing   Problem: Clinical Measurements: Goal: Ability to maintain clinical measurements within normal limits will improve Outcome: Progressing Goal: Will remain free from infection Outcome: Progressing Goal: Diagnostic test  results will improve Outcome: Progressing Goal: Respiratory complications will improve Outcome: Progressing Goal: Cardiovascular complication will be avoided Outcome: Progressing   Problem: Activity: Goal: Risk for activity intolerance will decrease Outcome: Progressing   Problem: Nutrition: Goal: Adequate nutrition will be maintained Outcome: Progressing   Problem: Coping: Goal: Level of anxiety will decrease Outcome: Progressing   Problem: Elimination: Goal: Will not experience complications related to bowel motility Outcome: Progressing Goal: Will not experience complications related to urinary retention Outcome: Progressing   Problem: Pain Managment: Goal: General experience of comfort will improve Outcome: Progressing   Problem: Safety: Goal: Ability to remain free from injury will improve Outcome: Progressing   Problem: Skin Integrity: Goal: Risk for impaired skin integrity will decrease Outcome: Progressing

## 2023-08-11 NOTE — Progress Notes (Signed)
Physical Therapy Treatment Patient Details Name: Joe Dillon MRN: [id number] DOB: [birth year] Today's Date: 08/11/2023   History of Present Illness Pt is a 67 yo M diagnosed with degenerative arthrosis of the right knee and is s/p elective R TKA.  PMH includes COPD, HTN, and CAD.    PT Comments  Pt was long sitting in bed upon arrival. He is A and O x 4 and endorsing eagerness to DC home this date. Pt demonstrates safe abilities to exit bed, stand to RW, and ambulate > 150 ft. No LOB or safety concerns. Also demonstrated safe abilities to ascend/descend stairs without difficulty.  Pt is cleared from an acute PT standpoint for safe DC home with HHPT to follow.   If plan is discharge home, recommend the following: A little help with walking and/or transfers;A little help with bathing/dressing/bathroom;Assistance with cooking/housework;Assist for transportation;Help with stairs or ramp for entrance     Equipment Recommendations  None recommended by PT       Precautions / Restrictions Precautions Precautions: Fall Restrictions Weight Bearing Restrictions: Yes RLE Weight Bearing: Weight bearing as tolerated     Mobility  Bed Mobility Overal bed mobility: Modified Independent     Transfers Overall transfer level: Modified independent Equipment used: Rolling walker (2 wheels) Transfers: Sit to/from Stand Sit to Stand: Modified independent (Device/Increase time)   Ambulation/Gait Ambulation/Gait assistance: Modified independent (Device/Increase time) Gait Distance (Feet): 200 Feet Assistive device: Rolling walker (2 wheels) Gait Pattern/deviations: Step-through pattern, Antalgic Gait velocity: decreased  General Gait Details: Pt was able to ambulate > 150 ft with RW without LOB or safety concern   Stairs Stairs: Yes Stairs assistance: Modified independent (Device/Increase time) Stair Management: One rail Left, Forwards Number of Stairs: 4      Balance Overall balance  assessment: Needs assistance Sitting-balance support: Feet supported Sitting balance-Leahy Scale: Normal     Standing balance support: Bilateral upper extremity supported, During functional activity Standing balance-Leahy Scale: Good         Cognition Arousal: Alert Behavior During Therapy: WFL for tasks assessed/performed Overall Cognitive Status: Within Functional Limits for tasks assessed  General Comments: Pt is A and O x 4        Exercises Other Exercises Other Exercises: Positioning education to encourage R knee ext PROM and prevent heel pressure Other Exercises: HEP education per handout        Pertinent Vitals/Pain Pain Assessment Pain Assessment: 0-10 Pain Score: 3  Pain Location: R knee Pain Descriptors / Indicators: Sore, Burning Pain Intervention(s): Limited activity within patient's tolerance, Monitored during session, Premedicated before session, Repositioned, Ice applied     PT Goals (current goals can now be found in the care plan section) Acute Rehab PT Goals Patient Stated Goal: go home Progress towards PT goals: Progressing toward goals    Frequency    BID       AM-PAC PT "6 Clicks" Mobility   Outcome Measure  Help needed turning from your back to your side while in a flat bed without using bedrails?: None Help needed moving from lying on your back to sitting on the side of a flat bed without using bedrails?: None Help needed moving to and from a bed to a chair (including a wheelchair)?: None Help needed standing up from a chair using your arms (e.g., wheelchair or bedside chair)?: None Help needed to walk in hospital room?: A Little Help needed climbing 3-5 steps with a railing? : A Little 6 Click Score: 22  End of Session   Activity Tolerance: Patient tolerated treatment well Patient left: in chair;with call bell/phone within reach;with chair alarm set;with family/visitor present;with SCD's reapplied;Other (comment) Nurse  Communication: Mobility status;Weight bearing status PT Visit Diagnosis: Other abnormalities of gait and mobility (R26.89);Muscle weakness (generalized) (M62.81);Pain Pain - Right/Left: Right Pain - part of body: Knee     Time: 0727-0747 PT Time Calculation (min) (ACUTE ONLY): 20 min  Charges:    $Gait Training: 8-22 mins PT General Charges $$ ACUTE PT VISIT: 1 Visit                     Jetta Lout PTA 08/11/23, 8:01 AM

## 2023-08-11 NOTE — TOC Transition Note (Signed)
Transition of Care [facility]) - CM/SW Discharge Note   Patient Details  Name: Joe Dillon MRN: [id number] Date of Birth: 04-22-1956  Transition of [facility] CM/SW Contact:  Bing Quarry, RN Phone Number: 08/11/2023, 9:25 AM   Clinical Narrative: 08/11/23: Patient has discharge orders in for today. HH was set up via [facility] prior to admission. Patient has needed/ordered DME at home per PT. Transportation via family/partnered transport. NO SDOH alerts noted that need TOC intervention.   Follow up appointments listed in AVS summary: --Follow up with Gean Birchwood Thursday Aug 23, 2023 Specialty: Orthopedic Surgery at 10:45am   --Follow up with Illene Labrador [facility] Thursday Sep 20, 2023 Specialty: Orthopedic Surgery at 2:45pm.  Gabriel Cirri MSN RN CM  Transitions of Care Department [facility] [phone] Weekends Only    Final next level of care: Home w Home Health Services Barriers to Discharge: No Barriers Identified   Patient Goals and CMS Choice      Discharge Placement                         Discharge Plan and Services Additional resources added to the After Visit Summary for     Discharge Planning Services: CM Consult            DME Arranged: 3-N-1, Walker rolling DME Agency: NA (Patient has needed equipment at home.)       HH Arranged:  (Arranged pta via surgeon office via [facility]) HH Agency: [facility] Date [facility] Agency Contacted: 08/10/23 Time HH Agency Contacted: [phone] Representative spoke with at [facility] Agency: [place]  Social Determinants of Health (SDOH) Interventions SDOH Screenings   Food Insecurity: No Food Insecurity (12/01/2022)  Housing: Low Risk  (12/01/2022)  Transportation Needs: No Transportation Needs (12/01/2022)  Utilities: Not At Risk (12/01/2022)  Alcohol Screen: Low Risk  (09/20/2021)  Depression (PHQ2-9): Low Risk  (09/20/2021)  Financial Resource Strain: Low Risk  (12/22/2020)  Physical Activity: Inactive  (12/22/2020)  Social Connections: Moderately Integrated (12/22/2020)  Stress: No Stress Concern Present (12/22/2020)  Tobacco Use: High Risk (08/10/2023)     Readmission Risk Interventions     No data to display
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
## 2023-08-12 DIAGNOSIS — Z79899 Other long term (current) drug therapy: Secondary | ICD-10-CM | POA: Diagnosis not present

## 2023-08-12 DIAGNOSIS — J4489 Other specified chronic obstructive pulmonary disease: Secondary | ICD-10-CM | POA: Diagnosis not present

## 2023-08-12 DIAGNOSIS — Z96651 Presence of right artificial knee joint: Secondary | ICD-10-CM | POA: Diagnosis not present

## 2023-08-12 DIAGNOSIS — I2510 Atherosclerotic heart disease of native coronary artery without angina pectoris: Secondary | ICD-10-CM | POA: Diagnosis not present

## 2023-08-12 DIAGNOSIS — Z7982 Long term (current) use of aspirin: Secondary | ICD-10-CM | POA: Diagnosis not present

## 2023-08-12 DIAGNOSIS — M1990 Unspecified osteoarthritis, unspecified site: Secondary | ICD-10-CM | POA: Diagnosis not present

## 2023-08-12 DIAGNOSIS — J439 Emphysema, unspecified: Secondary | ICD-10-CM | POA: Diagnosis not present

## 2023-08-12 DIAGNOSIS — I10 Essential (primary) hypertension: Secondary | ICD-10-CM | POA: Diagnosis not present

## 2023-08-12 DIAGNOSIS — F17210 Nicotine dependence, cigarettes, uncomplicated: Secondary | ICD-10-CM | POA: Diagnosis not present

## 2023-08-12 DIAGNOSIS — Z471 Aftercare following joint replacement surgery: Secondary | ICD-10-CM | POA: Diagnosis not present

## 2023-08-12 DIAGNOSIS — Z7951 Long term (current) use of inhaled steroids: Secondary | ICD-10-CM | POA: Diagnosis not present

## 2023-08-13 ENCOUNTER — Encounter: Payer: Self-pay | Admitting: Orthopedic Surgery

## 2023-08-14 DIAGNOSIS — Z96651 Presence of right artificial knee joint: Secondary | ICD-10-CM | POA: Diagnosis not present

## 2023-08-14 DIAGNOSIS — I10 Essential (primary) hypertension: Secondary | ICD-10-CM | POA: Diagnosis not present

## 2023-08-14 DIAGNOSIS — Z7982 Long term (current) use of aspirin: Secondary | ICD-10-CM | POA: Diagnosis not present

## 2023-08-14 DIAGNOSIS — J439 Emphysema, unspecified: Secondary | ICD-10-CM | POA: Diagnosis not present

## 2023-08-14 DIAGNOSIS — Z471 Aftercare following joint replacement surgery: Secondary | ICD-10-CM | POA: Diagnosis not present

## 2023-08-14 DIAGNOSIS — I2510 Atherosclerotic heart disease of native coronary artery without angina pectoris: Secondary | ICD-10-CM | POA: Diagnosis not present

## 2023-08-14 DIAGNOSIS — M1990 Unspecified osteoarthritis, unspecified site: Secondary | ICD-10-CM | POA: Diagnosis not present

## 2023-08-14 DIAGNOSIS — F17210 Nicotine dependence, cigarettes, uncomplicated: Secondary | ICD-10-CM | POA: Diagnosis not present

## 2023-08-14 DIAGNOSIS — Z79899 Other long term (current) drug therapy: Secondary | ICD-10-CM | POA: Diagnosis not present

## 2023-08-14 DIAGNOSIS — J4489 Other specified chronic obstructive pulmonary disease: Secondary | ICD-10-CM | POA: Diagnosis not present

## 2023-08-14 DIAGNOSIS — Z7951 Long term (current) use of inhaled steroids: Secondary | ICD-10-CM | POA: Diagnosis not present

## 2023-08-15 DIAGNOSIS — Z7951 Long term (current) use of inhaled steroids: Secondary | ICD-10-CM | POA: Diagnosis not present

## 2023-08-15 DIAGNOSIS — Z79899 Other long term (current) drug therapy: Secondary | ICD-10-CM | POA: Diagnosis not present

## 2023-08-15 DIAGNOSIS — J439 Emphysema, unspecified: Secondary | ICD-10-CM | POA: Diagnosis not present

## 2023-08-15 DIAGNOSIS — Z471 Aftercare following joint replacement surgery: Secondary | ICD-10-CM | POA: Diagnosis not present

## 2023-08-15 DIAGNOSIS — I2510 Atherosclerotic heart disease of native coronary artery without angina pectoris: Secondary | ICD-10-CM | POA: Diagnosis not present

## 2023-08-15 DIAGNOSIS — I10 Essential (primary) hypertension: Secondary | ICD-10-CM | POA: Diagnosis not present

## 2023-08-15 DIAGNOSIS — F17210 Nicotine dependence, cigarettes, uncomplicated: Secondary | ICD-10-CM | POA: Diagnosis not present

## 2023-08-15 DIAGNOSIS — M1990 Unspecified osteoarthritis, unspecified site: Secondary | ICD-10-CM | POA: Diagnosis not present

## 2023-08-15 DIAGNOSIS — Z7982 Long term (current) use of aspirin: Secondary | ICD-10-CM | POA: Diagnosis not present

## 2023-08-15 DIAGNOSIS — Z96651 Presence of right artificial knee joint: Secondary | ICD-10-CM | POA: Diagnosis not present

## 2023-08-15 DIAGNOSIS — J4489 Other specified chronic obstructive pulmonary disease: Secondary | ICD-10-CM | POA: Diagnosis not present

## 2023-08-17 DIAGNOSIS — I2510 Atherosclerotic heart disease of native coronary artery without angina pectoris: Secondary | ICD-10-CM | POA: Diagnosis not present

## 2023-08-17 DIAGNOSIS — Z471 Aftercare following joint replacement surgery: Secondary | ICD-10-CM | POA: Diagnosis not present

## 2023-08-17 DIAGNOSIS — Z7951 Long term (current) use of inhaled steroids: Secondary | ICD-10-CM | POA: Diagnosis not present

## 2023-08-17 DIAGNOSIS — Z7982 Long term (current) use of aspirin: Secondary | ICD-10-CM | POA: Diagnosis not present

## 2023-08-17 DIAGNOSIS — I10 Essential (primary) hypertension: Secondary | ICD-10-CM | POA: Diagnosis not present

## 2023-08-17 DIAGNOSIS — J439 Emphysema, unspecified: Secondary | ICD-10-CM | POA: Diagnosis not present

## 2023-08-17 DIAGNOSIS — F17210 Nicotine dependence, cigarettes, uncomplicated: Secondary | ICD-10-CM | POA: Diagnosis not present

## 2023-08-17 DIAGNOSIS — Z79899 Other long term (current) drug therapy: Secondary | ICD-10-CM | POA: Diagnosis not present

## 2023-08-17 DIAGNOSIS — Z96651 Presence of right artificial knee joint: Secondary | ICD-10-CM | POA: Diagnosis not present

## 2023-08-17 DIAGNOSIS — M1990 Unspecified osteoarthritis, unspecified site: Secondary | ICD-10-CM | POA: Diagnosis not present

## 2023-08-17 DIAGNOSIS — J4489 Other specified chronic obstructive pulmonary disease: Secondary | ICD-10-CM | POA: Diagnosis not present

## 2023-08-21 DIAGNOSIS — Z79899 Other long term (current) drug therapy: Secondary | ICD-10-CM | POA: Diagnosis not present

## 2023-08-21 DIAGNOSIS — J4489 Other specified chronic obstructive pulmonary disease: Secondary | ICD-10-CM | POA: Diagnosis not present

## 2023-08-21 DIAGNOSIS — F17210 Nicotine dependence, cigarettes, uncomplicated: Secondary | ICD-10-CM | POA: Diagnosis not present

## 2023-08-21 DIAGNOSIS — J439 Emphysema, unspecified: Secondary | ICD-10-CM | POA: Diagnosis not present

## 2023-08-21 DIAGNOSIS — Z7982 Long term (current) use of aspirin: Secondary | ICD-10-CM | POA: Diagnosis not present

## 2023-08-21 DIAGNOSIS — I2510 Atherosclerotic heart disease of native coronary artery without angina pectoris: Secondary | ICD-10-CM | POA: Diagnosis not present

## 2023-08-21 DIAGNOSIS — M1990 Unspecified osteoarthritis, unspecified site: Secondary | ICD-10-CM | POA: Diagnosis not present

## 2023-08-21 DIAGNOSIS — Z471 Aftercare following joint replacement surgery: Secondary | ICD-10-CM | POA: Diagnosis not present

## 2023-08-21 DIAGNOSIS — Z96651 Presence of right artificial knee joint: Secondary | ICD-10-CM | POA: Diagnosis not present

## 2023-08-21 DIAGNOSIS — I10 Essential (primary) hypertension: Secondary | ICD-10-CM | POA: Diagnosis not present

## 2023-08-21 DIAGNOSIS — Z7951 Long term (current) use of inhaled steroids: Secondary | ICD-10-CM | POA: Diagnosis not present

## 2023-08-22 DIAGNOSIS — F17210 Nicotine dependence, cigarettes, uncomplicated: Secondary | ICD-10-CM | POA: Diagnosis not present

## 2023-08-22 DIAGNOSIS — I2510 Atherosclerotic heart disease of native coronary artery without angina pectoris: Secondary | ICD-10-CM | POA: Diagnosis not present

## 2023-08-22 DIAGNOSIS — Z7982 Long term (current) use of aspirin: Secondary | ICD-10-CM | POA: Diagnosis not present

## 2023-08-22 DIAGNOSIS — I10 Essential (primary) hypertension: Secondary | ICD-10-CM | POA: Diagnosis not present

## 2023-08-22 DIAGNOSIS — Z471 Aftercare following joint replacement surgery: Secondary | ICD-10-CM | POA: Diagnosis not present

## 2023-08-22 DIAGNOSIS — J4489 Other specified chronic obstructive pulmonary disease: Secondary | ICD-10-CM | POA: Diagnosis not present

## 2023-08-22 DIAGNOSIS — Z96651 Presence of right artificial knee joint: Secondary | ICD-10-CM | POA: Diagnosis not present

## 2023-08-22 DIAGNOSIS — M1990 Unspecified osteoarthritis, unspecified site: Secondary | ICD-10-CM | POA: Diagnosis not present

## 2023-08-22 DIAGNOSIS — Z7951 Long term (current) use of inhaled steroids: Secondary | ICD-10-CM | POA: Diagnosis not present

## 2023-08-22 DIAGNOSIS — J439 Emphysema, unspecified: Secondary | ICD-10-CM | POA: Diagnosis not present

## 2023-08-22 DIAGNOSIS — Z79899 Other long term (current) drug therapy: Secondary | ICD-10-CM | POA: Diagnosis not present

## 2023-08-23 DIAGNOSIS — M25561 Pain in right knee: Secondary | ICD-10-CM | POA: Diagnosis not present

## 2023-08-23 DIAGNOSIS — M6281 Muscle weakness (generalized): Secondary | ICD-10-CM | POA: Diagnosis not present

## 2023-08-23 DIAGNOSIS — M25661 Stiffness of right knee, not elsewhere classified: Secondary | ICD-10-CM | POA: Diagnosis not present

## 2023-08-23 DIAGNOSIS — Z96651 Presence of right artificial knee joint: Secondary | ICD-10-CM | POA: Diagnosis not present

## 2023-08-23 DIAGNOSIS — G8929 Other chronic pain: Secondary | ICD-10-CM | POA: Diagnosis not present

## 2023-08-27 DIAGNOSIS — G8929 Other chronic pain: Secondary | ICD-10-CM | POA: Diagnosis not present

## 2023-08-27 DIAGNOSIS — Z96651 Presence of right artificial knee joint: Secondary | ICD-10-CM | POA: Diagnosis not present

## 2023-08-27 DIAGNOSIS — M25661 Stiffness of right knee, not elsewhere classified: Secondary | ICD-10-CM | POA: Diagnosis not present

## 2023-08-27 DIAGNOSIS — M6281 Muscle weakness (generalized): Secondary | ICD-10-CM | POA: Diagnosis not present

## 2023-08-27 DIAGNOSIS — M25561 Pain in right knee: Secondary | ICD-10-CM | POA: Diagnosis not present

## 2023-08-30 DIAGNOSIS — M50322 Other cervical disc degeneration at C5-C6 level: Secondary | ICD-10-CM | POA: Diagnosis not present

## 2023-08-30 DIAGNOSIS — M9903 Segmental and somatic dysfunction of lumbar region: Secondary | ICD-10-CM | POA: Diagnosis not present

## 2023-08-30 DIAGNOSIS — M7918 Myalgia, other site: Secondary | ICD-10-CM | POA: Diagnosis not present

## 2023-08-30 DIAGNOSIS — M9904 Segmental and somatic dysfunction of sacral region: Secondary | ICD-10-CM | POA: Diagnosis not present

## 2023-08-30 DIAGNOSIS — Z96651 Presence of right artificial knee joint: Secondary | ICD-10-CM | POA: Diagnosis not present

## 2023-08-30 DIAGNOSIS — M5451 Vertebrogenic low back pain: Secondary | ICD-10-CM | POA: Diagnosis not present

## 2023-08-30 DIAGNOSIS — M542 Cervicalgia: Secondary | ICD-10-CM | POA: Diagnosis not present

## 2023-08-30 DIAGNOSIS — M50323 Other cervical disc degeneration at C6-C7 level: Secondary | ICD-10-CM | POA: Diagnosis not present

## 2023-08-30 DIAGNOSIS — M9901 Segmental and somatic dysfunction of cervical region: Secondary | ICD-10-CM | POA: Diagnosis not present

## 2023-09-05 DIAGNOSIS — G8929 Other chronic pain: Secondary | ICD-10-CM | POA: Diagnosis not present

## 2023-09-05 DIAGNOSIS — M25661 Stiffness of right knee, not elsewhere classified: Secondary | ICD-10-CM | POA: Diagnosis not present

## 2023-09-05 DIAGNOSIS — M25561 Pain in right knee: Secondary | ICD-10-CM | POA: Diagnosis not present

## 2023-09-05 DIAGNOSIS — Z96651 Presence of right artificial knee joint: Secondary | ICD-10-CM | POA: Diagnosis not present

## 2023-09-05 DIAGNOSIS — M6281 Muscle weakness (generalized): Secondary | ICD-10-CM | POA: Diagnosis not present

## 2023-09-06 DIAGNOSIS — Z471 Aftercare following joint replacement surgery: Secondary | ICD-10-CM | POA: Diagnosis not present

## 2023-09-07 DIAGNOSIS — M25561 Pain in right knee: Secondary | ICD-10-CM | POA: Diagnosis not present

## 2023-09-07 DIAGNOSIS — M6281 Muscle weakness (generalized): Secondary | ICD-10-CM | POA: Diagnosis not present

## 2023-09-07 DIAGNOSIS — M25661 Stiffness of right knee, not elsewhere classified: Secondary | ICD-10-CM | POA: Diagnosis not present

## 2023-09-07 DIAGNOSIS — G8929 Other chronic pain: Secondary | ICD-10-CM | POA: Diagnosis not present

## 2023-09-07 DIAGNOSIS — Z96651 Presence of right artificial knee joint: Secondary | ICD-10-CM | POA: Diagnosis not present

## 2023-09-10 DIAGNOSIS — M25661 Stiffness of right knee, not elsewhere classified: Secondary | ICD-10-CM | POA: Diagnosis not present

## 2023-09-10 DIAGNOSIS — M6281 Muscle weakness (generalized): Secondary | ICD-10-CM | POA: Diagnosis not present

## 2023-09-10 DIAGNOSIS — G8929 Other chronic pain: Secondary | ICD-10-CM | POA: Diagnosis not present

## 2023-09-10 DIAGNOSIS — M25561 Pain in right knee: Secondary | ICD-10-CM | POA: Diagnosis not present

## 2023-09-10 DIAGNOSIS — Z96651 Presence of right artificial knee joint: Secondary | ICD-10-CM | POA: Diagnosis not present

## 2023-09-13 DIAGNOSIS — Z96651 Presence of right artificial knee joint: Secondary | ICD-10-CM | POA: Diagnosis not present

## 2023-09-13 DIAGNOSIS — G8929 Other chronic pain: Secondary | ICD-10-CM | POA: Diagnosis not present

## 2023-09-13 DIAGNOSIS — M6281 Muscle weakness (generalized): Secondary | ICD-10-CM | POA: Diagnosis not present

## 2023-09-13 DIAGNOSIS — M25561 Pain in right knee: Secondary | ICD-10-CM | POA: Diagnosis not present

## 2023-09-13 DIAGNOSIS — M25661 Stiffness of right knee, not elsewhere classified: Secondary | ICD-10-CM | POA: Diagnosis not present

## 2023-09-20 DIAGNOSIS — Z96651 Presence of right artificial knee joint: Secondary | ICD-10-CM | POA: Diagnosis not present

## 2023-09-20 DIAGNOSIS — M1712 Unilateral primary osteoarthritis, left knee: Secondary | ICD-10-CM | POA: Diagnosis not present

## 2023-09-26 DIAGNOSIS — L821 Other seborrheic keratosis: Secondary | ICD-10-CM | POA: Diagnosis not present

## 2023-09-26 DIAGNOSIS — D225 Melanocytic nevi of trunk: Secondary | ICD-10-CM | POA: Diagnosis not present

## 2023-09-26 DIAGNOSIS — L820 Inflamed seborrheic keratosis: Secondary | ICD-10-CM | POA: Diagnosis not present

## 2023-10-11 DIAGNOSIS — E782 Mixed hyperlipidemia: Secondary | ICD-10-CM | POA: Diagnosis not present

## 2023-10-11 DIAGNOSIS — Z720 Tobacco use: Secondary | ICD-10-CM | POA: Diagnosis not present

## 2023-10-11 DIAGNOSIS — J431 Panlobular emphysema: Secondary | ICD-10-CM | POA: Diagnosis not present

## 2023-10-11 DIAGNOSIS — R0789 Other chest pain: Secondary | ICD-10-CM | POA: Diagnosis not present

## 2023-10-11 DIAGNOSIS — I2089 Other forms of angina pectoris: Secondary | ICD-10-CM | POA: Diagnosis not present

## 2023-10-11 DIAGNOSIS — R0602 Shortness of breath: Secondary | ICD-10-CM | POA: Diagnosis not present

## 2023-10-11 DIAGNOSIS — I10 Essential (primary) hypertension: Secondary | ICD-10-CM | POA: Diagnosis not present

## 2023-10-11 DIAGNOSIS — I2510 Atherosclerotic heart disease of native coronary artery without angina pectoris: Secondary | ICD-10-CM | POA: Diagnosis not present

## 2023-10-11 DIAGNOSIS — R0902 Hypoxemia: Secondary | ICD-10-CM | POA: Diagnosis not present

## 2023-10-11 DIAGNOSIS — K219 Gastro-esophageal reflux disease without esophagitis: Secondary | ICD-10-CM | POA: Diagnosis not present

## 2023-10-22 DIAGNOSIS — R0789 Other chest pain: Secondary | ICD-10-CM | POA: Diagnosis not present

## 2023-10-22 NOTE — Discharge Instructions (Signed)

## 2023-11-07 DIAGNOSIS — J189 Pneumonia, unspecified organism: Secondary | ICD-10-CM

## 2023-11-07 HISTORY — DX: Pneumonia, unspecified organism: J18.9

## 2023-11-09 ENCOUNTER — Other Ambulatory Visit: Payer: Self-pay | Admitting: Emergency Medicine

## 2023-11-09 DIAGNOSIS — F17210 Nicotine dependence, cigarettes, uncomplicated: Secondary | ICD-10-CM

## 2023-11-11 ENCOUNTER — Encounter: Payer: Self-pay | Admitting: Orthopedic Surgery

## 2023-11-12 ENCOUNTER — Encounter
Admission: RE | Admit: 2023-11-12 | Discharge: 2023-11-12 | Disposition: A | Discharge status: Home / Self Care | Payer: Medicare Other | Source: Ambulatory Visit | Attending: Orthopedic Surgery | Admitting: Orthopedic Surgery

## 2023-11-12 ENCOUNTER — Other Ambulatory Visit: Payer: Self-pay

## 2023-11-12 DIAGNOSIS — M1712 Unilateral primary osteoarthritis, left knee: Secondary | ICD-10-CM

## 2023-11-12 DIAGNOSIS — I25118 Atherosclerotic heart disease of native coronary artery with other forms of angina pectoris: Secondary | ICD-10-CM | POA: Diagnosis not present

## 2023-11-12 DIAGNOSIS — R6510 Systemic inflammatory response syndrome (SIRS) of non-infectious origin without acute organ dysfunction: Secondary | ICD-10-CM

## 2023-11-12 DIAGNOSIS — Z01812 Encounter for preprocedural laboratory examination: Secondary | ICD-10-CM | POA: Diagnosis present

## 2023-11-12 DIAGNOSIS — K921 Melena: Secondary | ICD-10-CM | POA: Diagnosis not present

## 2023-11-12 DIAGNOSIS — R079 Chest pain, unspecified: Secondary | ICD-10-CM

## 2023-11-12 DIAGNOSIS — Z01818 Encounter for other preprocedural examination: Secondary | ICD-10-CM | POA: Insufficient documentation

## 2023-11-12 DIAGNOSIS — I499 Cardiac arrhythmia, unspecified: Secondary | ICD-10-CM

## 2023-11-12 DIAGNOSIS — R5383 Other fatigue: Secondary | ICD-10-CM | POA: Diagnosis not present

## 2023-11-12 HISTORY — DX: Panlobular emphysema: J43.1

## 2023-11-12 HISTORY — DX: Bilateral primary osteoarthritis of knee: M17.0

## 2023-11-12 LAB — URINALYSIS, ROUTINE W REFLEX MICROSCOPIC
Bilirubin Urine: NEGATIVE
Glucose, UA: NEGATIVE mg/dL
Hgb urine dipstick: NEGATIVE
Ketones, ur: NEGATIVE mg/dL
Leukocytes,Ua: NEGATIVE
Nitrite: NEGATIVE
Protein, ur: NEGATIVE mg/dL
Specific Gravity, Urine: 1.023 (ref 1.005–1.030)
pH: 5 (ref 5.0–8.0)

## 2023-11-12 LAB — COMPREHENSIVE METABOLIC PANEL
ALT: 19 U/L (ref 0–44)
AST: 17 U/L (ref 15–41)
Albumin: 4 g/dL (ref 3.5–5.0)
Alkaline Phosphatase: 99 U/L (ref 38–126)
Anion gap: 10 (ref 5–15)
BUN: 22 mg/dL (ref 8–23)
CO2: 27 mmol/L (ref 22–32)
Calcium: 8.7 mg/dL — ABNORMAL LOW (ref 8.9–10.3)
Chloride: 99 mmol/L (ref 98–111)
Creatinine, Ser: 0.93 mg/dL (ref 0.61–1.24)
GFR, Estimated: >60 mL/min (ref >60)
Glucose, Bld: 80 mg/dL (ref 70–99)
Potassium: 3.7 mmol/L (ref 3.5–5.1)
Sodium: 136 mmol/L (ref 135–145)
Total Bilirubin: 0.5 mg/dL (ref 0.0–1.2)
Total Protein: 7.2 g/dL (ref 6.5–8.1)

## 2023-11-12 LAB — CBC
HCT: 36.4 % — ABNORMAL LOW (ref 39.0–52.0)
Hemoglobin: 11.9 g/dL — ABNORMAL LOW (ref 13.0–17.0)
MCH: 29.1 pg (ref 26.0–34.0)
MCHC: 32.7 g/dL (ref 30.0–36.0)
MCV: 89 fL (ref 80.0–100.0)
Platelets: 217 10*3/uL (ref 150–400)
RBC: 4.09 MIL/uL — ABNORMAL LOW (ref 4.22–5.81)
RDW: 12.6 % (ref 11.5–15.5)
WBC: 6.8 10*3/uL (ref 4.0–10.5)
nRBC: 0 % (ref 0.0–0.2)

## 2023-11-12 LAB — SEDIMENTATION RATE: Sed Rate: 14 mm/h (ref 0–20)

## 2023-11-12 LAB — SURGICAL PCR SCREEN
MRSA, PCR: NEGATIVE
Staphylococcus aureus: NEGATIVE

## 2023-11-12 LAB — C-REACTIVE PROTEIN: CRP: <0.5 mg/dL (ref <1.0)

## 2023-11-12 NOTE — Patient Instructions (Signed)
 Your procedure is scheduled on: Friday, January 10 Report to the Registration Desk on the 1st floor of the [facility] To find out your arrival time, please call [phone] between 1PM - 3PM on: Thursday, January 9 If your arrival time is 6:00 am, do not arrive before that time as the [facility] entrance doors do not open until 6:00 am.  REMEMBER: Instructions that are not followed completely may result in serious medical risk, up to and including death; or upon the discretion of your surgeon and anesthesiologist your surgery may need to be rescheduled.  Do not eat food after midnight the night before surgery.  No gum chewing or hard candies.  You may however, drink CLEAR liquids up to 2 hours before you are scheduled to arrive for your surgery. Do not drink anything within 2 hours of your scheduled arrival time.  Clear liquids include: - water  - apple juice without pulp - gatorade (not RED colors) - black coffee or tea (Do NOT add milk or creamers to the coffee or tea) Do NOT drink anything that is not on this list.  In addition, your doctor has ordered for you to drink the provided:  Ensure Pre-Surgery Clear Carbohydrate Drink  Drinking this carbohydrate drink up to two hours before surgery helps to reduce insulin resistance and improve patient outcomes. Please complete drinking 2 hours before scheduled arrival time.  One week prior to surgery: starting today, January 6 Stop meloxicam  and  Anti-inflammatories (NSAIDS) such as Advil, Aleve, Ibuprofen, Motrin, Naproxen, Naprosyn and Aspirin  based products such as Excedrin, Goody's Powder, BC Powder. Stop ANY OVER THE COUNTER supplements until after surgery. Stop vitamin C .  You may however, continue to take Tylenol  if needed for pain up until the day of surgery.  Per Dr. Hooten; continue taking the aspirin  81 mg.  Continue taking all of your other prescription medications up until the day of surgery.  ON THE DAY OF SURGERY  ONLY TAKE THESE MEDICATIONS WITH SIPS OF WATER:  Amlodipine  Esomeprazole (Nexium) Isosorbide  mononitrate Rosuvastatin  (Crestor ) TRELEGY ELLIPTA  inhaler Albuterol  nebulizer  Use inhalers on the day of surgery and bring your albuterol  inhaler to the hospital.  No Alcohol for 24 hours before or after surgery.  No Smoking including e-cigarettes for 24 hours before surgery.  No chewable tobacco products for at least 6 hours before surgery.  No nicotine patches on the day of surgery.  Do not use any recreational drugs for at least a week (preferably 2 weeks) before your surgery.  Please be advised that the combination of cocaine and anesthesia may have negative outcomes, up to and including death. If you test positive for cocaine, your surgery will be cancelled.  On the morning of surgery brush your teeth with toothpaste and water, you may rinse your mouth with mouthwash if you wish. Do not swallow any toothpaste or mouthwash.  Use CHG Soap as directed on instruction sheet.  Do not wear jewelry, make-up, hairpins, clips or nail polish.  For welded (permanent) jewelry: bracelets, anklets, waist bands, etc.  Please have this removed prior to surgery.  If it is not removed, there is a chance that hospital personnel will need to cut it off on the day of surgery.  Do not wear lotions, powders, or perfumes.   Do not shave body hair from the neck down 48 hours before surgery.  Contact lenses, hearing aids and dentures may not be worn into surgery.  Do not bring valuables to the hospital.  [facility] is not responsible for any missing/lost belongings or valuables.   Notify your doctor if there is any change in your medical condition (cold, fever, infection).  Wear comfortable clothing (specific to your surgery type) to the hospital.  After surgery, you can help prevent lung complications by doing breathing exercises.  Take deep breaths and cough every 1-2 hours. Your doctor may order a  device called an Incentive Spirometer to help you take deep breaths.  If you are being admitted to the hospital overnight, leave your suitcase in the car. After surgery it may be brought to your room.  If you are being discharged the day of surgery, you will not be allowed to drive home. You will need a responsible individual to drive you home and stay with you for 24 hours after surgery.   If you are taking public transportation, you will need to have a responsible individual with you.  Please call the Pre-admissions Testing Dept. at [phone] if you have any questions about these instructions.  Surgery Visitation Policy:  Patients having surgery or a procedure may have two visitors.  Children under the age of 8 must have an adult with them who is not the patient.  Inpatient Visitation:    Visiting hours are 7 a.m. to 8 p.m. Up to four visitors are allowed at one time in a patient room. The visitors may rotate out with other people during the day.  One visitor age 59 or older may stay with the patient overnight and must be in the room by 8 p.m.    Pre-operative 5 CHG Bath Instructions   You can play a key role in reducing the risk of infection after surgery. Your skin needs to be as free of germs as possible. You can reduce the number of germs on your skin by washing with CHG (chlorhexidine  gluconate) soap before surgery. CHG is an antiseptic soap that kills germs and continues to kill germs even after washing.   DO NOT use if you have an allergy to chlorhexidine /CHG or antibacterial soaps. If your skin becomes reddened or irritated, stop using the CHG and notify one of our RNs at [phone].   Please shower with the CHG soap starting 4 days before surgery using the following schedule:     Please keep in mind the following:  DO NOT shave, including legs and underarms, starting the day of your first shower.   You may shave your face at any point before/day of surgery.   Place clean sheets on your bed the day you start using CHG soap. Use a clean washcloth (not used since being washed) for each shower. DO NOT sleep with pets once you start using the CHG.   CHG Shower Instructions:  If you choose to wash your hair and private area, wash first with your normal shampoo/soap.  After you use shampoo/soap, rinse your hair and body thoroughly to remove shampoo/soap residue.  Turn the water OFF and apply about 3 tablespoons (45 ml) of CHG soap to a CLEAN washcloth.  Apply CHG soap ONLY FROM YOUR NECK DOWN TO YOUR TOES (washing for 3-5 minutes)  DO NOT use CHG soap on face, private areas, open wounds, or sores.  Pay special attention to the area where your surgery is being performed.  If you are having back surgery, having someone wash your back for you may be helpful. Wait 2 minutes after CHG soap is applied, then you may rinse off the CHG soap.  Pat dry with a clean towel  Put on clean clothes/pajamas   If you choose to wear lotion, please use ONLY the CHG-compatible lotions on the back of this paper.     Additional instructions for the day of surgery: DO NOT APPLY any lotions, deodorants, cologne, or perfumes.   Put on clean/comfortable clothes.  Brush your teeth.  Ask your nurse before applying any prescription medications to the skin.      CHG Compatible Lotions   Aveeno Moisturizing lotion  Cetaphil Moisturizing Cream  Cetaphil Moisturizing Lotion  Clairol Herbal Essence Moisturizing Lotion, Dry Skin  Clairol Herbal Essence Moisturizing Lotion, Extra Dry Skin  Clairol Herbal Essence Moisturizing Lotion, Normal Skin  Curel Age Defying Therapeutic Moisturizing Lotion with Alpha Hydroxy  Curel Extreme Care Body Lotion  Curel Soothing Hands Moisturizing Hand Lotion  Curel Therapeutic Moisturizing Cream, Fragrance-Free  Curel Therapeutic Moisturizing Lotion, Fragrance-Free  Curel Therapeutic Moisturizing Lotion, Original Formula  Eucerin Daily  Replenishing Lotion  Eucerin Dry Skin Therapy Plus Alpha Hydroxy Crme  Eucerin Dry Skin Therapy Plus Alpha Hydroxy Lotion  Eucerin Original Crme  Eucerin Original Lotion  Eucerin Plus Crme Eucerin Plus Lotion  Eucerin TriLipid Replenishing Lotion  Keri Anti-Bacterial Hand Lotion  Keri Deep Conditioning Original Lotion Dry Skin Formula Softly Scented  Keri Deep Conditioning Original Lotion, Fragrance Free Sensitive Skin Formula  Keri Lotion Fast Absorbing Fragrance Free Sensitive Skin Formula  Keri Lotion Fast Absorbing Softly Scented Dry Skin Formula  Keri Original Lotion  Keri Skin Renewal Lotion Keri Silky Smooth Lotion  Keri Silky Smooth Sensitive Skin Lotion  Nivea Body Creamy Conditioning Oil  Nivea Body Extra Enriched Lotion  Nivea Body Original Lotion  Nivea Body Sheer Moisturizing Lotion Nivea Crme  Nivea Skin Firming Lotion  NutraDerm 30 Skin Lotion  NutraDerm Skin Lotion  NutraDerm Therapeutic Skin Cream  NutraDerm Therapeutic Skin Lotion  ProShield Protective Hand Cream  Provon moisturizing lotion  Preoperative Educational Videos for Total Hip, Knee and Shoulder Replacements  To better prepare for surgery, please view our videos that explain the physical activity and discharge planning required to have the best surgical recovery at [facility].  [email or web address]  Questions? Call [phone] or email [email or web address]

## 2023-11-12 NOTE — H&P (View-Only) (Signed)
 Faxed over request for cardiac clearance ([facility]) and pulmonary clearance ([facility])

## 2023-11-12 NOTE — Progress Notes (Signed)
 Faxed over request for cardiac clearance ([facility]) and pulmonary clearance ([facility])

## 2023-11-15 ENCOUNTER — Other Ambulatory Visit: Payer: Self-pay | Admitting: Otolaryngology

## 2023-11-15 DIAGNOSIS — R221 Localized swelling, mass and lump, neck: Secondary | ICD-10-CM

## 2023-11-16 ENCOUNTER — Ambulatory Visit: Payer: Medicare Other | Admitting: Anesthesiology

## 2023-11-16 ENCOUNTER — Ambulatory Visit: Payer: Medicare Other | Admitting: Urgent Care

## 2023-11-16 ENCOUNTER — Other Ambulatory Visit: Payer: Self-pay

## 2023-11-16 ENCOUNTER — Encounter: Payer: Self-pay | Admitting: Orthopedic Surgery

## 2023-11-16 ENCOUNTER — Inpatient Hospital Stay
Admission: RE | Admit: 2023-11-16 | Discharge: 2023-11-20 | DRG: 322 | Disposition: A | Discharge status: Home / Self Care | Payer: Medicare Other | Attending: Internal Medicine | Admitting: Internal Medicine

## 2023-11-16 ENCOUNTER — Observation Stay: Payer: Medicare Other

## 2023-11-16 ENCOUNTER — Encounter
Admission: RE | Disposition: A | Discharge status: Home / Self Care | Payer: Self-pay | Source: Home / Self Care | Attending: Orthopedic Surgery

## 2023-11-16 DIAGNOSIS — K921 Melena: Secondary | ICD-10-CM

## 2023-11-16 DIAGNOSIS — M1712 Unilateral primary osteoarthritis, left knee: Principal | ICD-10-CM | POA: Diagnosis present

## 2023-11-16 DIAGNOSIS — I214 Non-ST elevation (NSTEMI) myocardial infarction: Principal | ICD-10-CM | POA: Diagnosis present

## 2023-11-16 DIAGNOSIS — R079 Chest pain, unspecified: Secondary | ICD-10-CM

## 2023-11-16 DIAGNOSIS — I2510 Atherosclerotic heart disease of native coronary artery without angina pectoris: Secondary | ICD-10-CM | POA: Diagnosis present

## 2023-11-16 DIAGNOSIS — Z96651 Presence of right artificial knee joint: Secondary | ICD-10-CM | POA: Diagnosis present

## 2023-11-16 DIAGNOSIS — Z96652 Presence of left artificial knee joint: Secondary | ICD-10-CM

## 2023-11-16 DIAGNOSIS — I25118 Atherosclerotic heart disease of native coronary artery with other forms of angina pectoris: Secondary | ICD-10-CM

## 2023-11-16 DIAGNOSIS — I499 Cardiac arrhythmia, unspecified: Secondary | ICD-10-CM

## 2023-11-16 DIAGNOSIS — Z7902 Long term (current) use of antithrombotics/antiplatelets: Secondary | ICD-10-CM

## 2023-11-16 DIAGNOSIS — Z79899 Other long term (current) drug therapy: Secondary | ICD-10-CM

## 2023-11-16 DIAGNOSIS — Z860101 Personal history of adenomatous and serrated colon polyps: Secondary | ICD-10-CM

## 2023-11-16 DIAGNOSIS — D649 Anemia, unspecified: Secondary | ICD-10-CM | POA: Diagnosis not present

## 2023-11-16 DIAGNOSIS — J431 Panlobular emphysema: Secondary | ICD-10-CM | POA: Diagnosis present

## 2023-11-16 DIAGNOSIS — R6510 Systemic inflammatory response syndrome (SIRS) of non-infectious origin without acute organ dysfunction: Secondary | ICD-10-CM

## 2023-11-16 DIAGNOSIS — E785 Hyperlipidemia, unspecified: Secondary | ICD-10-CM | POA: Diagnosis present

## 2023-11-16 DIAGNOSIS — K219 Gastro-esophageal reflux disease without esophagitis: Secondary | ICD-10-CM | POA: Diagnosis present

## 2023-11-16 DIAGNOSIS — Z01812 Encounter for preprocedural laboratory examination: Secondary | ICD-10-CM

## 2023-11-16 DIAGNOSIS — R5383 Other fatigue: Secondary | ICD-10-CM

## 2023-11-16 DIAGNOSIS — J449 Chronic obstructive pulmonary disease, unspecified: Secondary | ICD-10-CM | POA: Insufficient documentation

## 2023-11-16 DIAGNOSIS — I10 Essential (primary) hypertension: Secondary | ICD-10-CM | POA: Diagnosis present

## 2023-11-16 DIAGNOSIS — Z7982 Long term (current) use of aspirin: Secondary | ICD-10-CM

## 2023-11-16 DIAGNOSIS — J4489 Other specified chronic obstructive pulmonary disease: Secondary | ICD-10-CM | POA: Diagnosis present

## 2023-11-16 DIAGNOSIS — Z87891 Personal history of nicotine dependence: Secondary | ICD-10-CM

## 2023-11-16 HISTORY — PX: KNEE ARTHROPLASTY: SHX992

## 2023-11-16 SURGERY — ARTHROPLASTY, KNEE, TOTAL, USING IMAGELESS COMPUTER-ASSISTED NAVIGATION
Anesthesia: General | Site: Knee | Laterality: Left

## 2023-11-16 MED ORDER — BISACODYL 10 MG RE SUPP: 10.0000 mg | Freq: Every day | RECTAL | Status: DC | PRN

## 2023-11-16 MED ORDER — MELOXICAM 7.5 MG PO TABS
15.0000 mg | ORAL_TABLET | ORAL | Status: DC
  Administered 2023-11-17 – 2023-11-20 (×4): 15 mg via ORAL
  Filled 2023-11-16 (×4): qty 2

## 2023-11-16 MED ORDER — SENNOSIDES-DOCUSATE SODIUM 8.6-50 MG PO TABS
1.0000 | ORAL_TABLET | Freq: Two times a day (BID) | ORAL | Status: DC
  Administered 2023-11-16 – 2023-11-20 (×8): 1 via ORAL
  Filled 2023-11-16 (×8): qty 1

## 2023-11-16 MED ORDER — AMLODIPINE BESYLATE 10 MG PO TABS
10.0000 mg | ORAL_TABLET | Freq: Every day | ORAL | Status: DC
  Administered 2023-11-17 – 2023-11-20 (×3): 10 mg via ORAL
  Filled 2023-11-16 (×3): qty 1

## 2023-11-16 MED ORDER — SODIUM CHLORIDE FLUSH 0.9 % IV SOLN
INTRAVENOUS | Status: AC
  Filled 2023-11-16: qty 40

## 2023-11-16 MED ORDER — CELECOXIB 200 MG PO CAPS
400.0000 mg | ORAL_CAPSULE | Freq: Once | ORAL | Status: AC
  Administered 2023-11-16: 400 mg via ORAL

## 2023-11-16 MED ORDER — LACTATED RINGERS IV SOLN
INTRAVENOUS | Status: AC
Start: 2023-11-16 — End: 2023-11-16

## 2023-11-16 MED ORDER — PHENYLEPHRINE 80 MCG/ML (10ML) SYRINGE FOR IV PUSH (FOR BLOOD PRESSURE SUPPORT)
PREFILLED_SYRINGE | INTRAVENOUS | Status: DC | PRN
  Administered 2023-11-16: 80 ug via INTRAVENOUS

## 2023-11-16 MED ORDER — SURGIPHOR WOUND IRRIGATION SYSTEM - OPTIME: TOPICAL | Status: DC | PRN

## 2023-11-16 MED ORDER — SODIUM CHLORIDE (PF) 0.9 % IJ SOLN
INTRAMUSCULAR | Status: DC | PRN
  Administered 2023-11-16: 120 mL

## 2023-11-16 MED ORDER — OXYCODONE HCL 5 MG PO TABS
ORAL_TABLET | ORAL | Status: AC
  Filled 2023-11-16: qty 2

## 2023-11-16 MED ORDER — CEFAZOLIN SODIUM-DEXTROSE 2-4 GM/100ML-% IV SOLN
INTRAVENOUS | Status: AC
  Filled 2023-11-16: qty 100

## 2023-11-16 MED ORDER — DEXAMETHASONE SODIUM PHOSPHATE 10 MG/ML IJ SOLN
8.0000 mg | Freq: Once | INTRAMUSCULAR | Status: AC
  Administered 2023-11-16: 8 mg via INTRAVENOUS

## 2023-11-16 MED ORDER — TRANEXAMIC ACID-NACL 1000-0.7 MG/100ML-% IV SOLN
1000.0000 mg | INTRAVENOUS | Status: AC
  Administered 2023-11-16: 1000 mg via INTRAVENOUS

## 2023-11-16 MED ORDER — CHLORHEXIDINE GLUCONATE 0.12 % MT SOLN
15.0000 mL | Freq: Once | OROMUCOSAL | Status: AC
  Administered 2023-11-16: 15 mL via OROMUCOSAL

## 2023-11-16 MED ORDER — TRAMADOL HCL 50 MG PO TABS
50.0000 mg | ORAL_TABLET | ORAL | Status: DC | PRN
  Administered 2023-11-17 – 2023-11-19 (×4): 100 mg via ORAL
  Administered 2023-11-20: 50 mg via ORAL
  Filled 2023-11-16 (×4): qty 2
  Filled 2023-11-16: qty 1

## 2023-11-16 MED ORDER — LOSARTAN POTASSIUM 50 MG PO TABS
100.0000 mg | ORAL_TABLET | Freq: Every day | ORAL | Status: DC
  Administered 2023-11-17 – 2023-11-20 (×3): 100 mg via ORAL
  Filled 2023-11-16 (×3): qty 2

## 2023-11-16 MED ORDER — MIDAZOLAM HCL 2 MG/2ML IJ SOLN
INTRAMUSCULAR | Status: AC
  Filled 2023-11-16: qty 2

## 2023-11-16 MED ORDER — PROPOFOL 1000 MG/100ML IV EMUL
INTRAVENOUS | Status: AC
  Filled 2023-11-16: qty 100

## 2023-11-16 MED ORDER — ACETAMINOPHEN 10 MG/ML IV SOLN: 1000.0000 mg | Freq: Once | INTRAVENOUS | Status: DC | PRN

## 2023-11-16 MED ORDER — FENTANYL CITRATE (PF) 100 MCG/2ML IJ SOLN
INTRAMUSCULAR | Status: AC
  Filled 2023-11-16: qty 2

## 2023-11-16 MED ORDER — KETAMINE HCL 50 MG/5ML IJ SOSY
PREFILLED_SYRINGE | INTRAMUSCULAR | Status: AC
  Filled 2023-11-16: qty 5

## 2023-11-16 MED ORDER — BUPIVACAINE HCL (PF) 0.25 % IJ SOLN
INTRAMUSCULAR | Status: AC
  Filled 2023-11-16: qty 60

## 2023-11-16 MED ORDER — ACETAMINOPHEN 10 MG/ML IV SOLN
1000.0000 mg | Freq: Four times a day (QID) | INTRAVENOUS | Status: AC
  Administered 2023-11-17: 1000 mg via INTRAVENOUS
  Filled 2023-11-16: qty 100

## 2023-11-16 MED ORDER — ROSUVASTATIN CALCIUM 10 MG PO TABS
40.0000 mg | ORAL_TABLET | ORAL | Status: DC
  Administered 2023-11-17 – 2023-11-20 (×4): 40 mg via ORAL
  Filled 2023-11-16 (×4): qty 4

## 2023-11-16 MED ORDER — LACTATED RINGERS IV SOLN: INTRAVENOUS | Status: DC

## 2023-11-16 MED ORDER — ALUM & MAG HYDROXIDE-SIMETH 200-200-20 MG/5ML PO SUSP: 30.0000 mL | ORAL | Status: DC | PRN

## 2023-11-16 MED ORDER — OXYCODONE HCL 5 MG PO TABS: 5.0000 mg | ORAL_TABLET | ORAL | 0 refills | Status: DC | PRN

## 2023-11-16 MED ORDER — ONDANSETRON HCL 4 MG/2ML IJ SOLN: 4.0000 mg | Freq: Four times a day (QID) | INTRAMUSCULAR | Status: DC | PRN

## 2023-11-16 MED ORDER — METOCLOPRAMIDE HCL 10 MG PO TABS
10.0000 mg | ORAL_TABLET | Freq: Three times a day (TID) | ORAL | Status: AC
  Administered 2023-11-16 – 2023-11-18 (×8): 10 mg via ORAL
  Filled 2023-11-16: qty 2
  Filled 2023-11-16: qty 1
  Filled 2023-11-16: qty 2
  Filled 2023-11-16: qty 1
  Filled 2023-11-16 (×2): qty 2
  Filled 2023-11-16: qty 1
  Filled 2023-11-16: qty 2

## 2023-11-16 MED ORDER — CEFAZOLIN SODIUM-DEXTROSE 2-4 GM/100ML-% IV SOLN
2.0000 g | INTRAVENOUS | Status: AC
  Administered 2023-11-16: 2 g via INTRAVENOUS

## 2023-11-16 MED ORDER — ONDANSETRON HCL 4 MG/2ML IJ SOLN: 4.0000 mg | Freq: Once | INTRAMUSCULAR | Status: DC | PRN

## 2023-11-16 MED ORDER — HYDROMORPHONE HCL 1 MG/ML IJ SOLN
0.5000 mg | INTRAMUSCULAR | Status: DC | PRN
  Administered 2023-11-17: 1 mg via INTRAVENOUS
  Filled 2023-11-16: qty 1

## 2023-11-16 MED ORDER — GABAPENTIN 300 MG PO CAPS
ORAL_CAPSULE | ORAL | Status: AC
  Filled 2023-11-16: qty 1

## 2023-11-16 MED ORDER — PHENOL 1.4 % MT LIQD: 1.0000 | OROMUCOSAL | Status: DC | PRN

## 2023-11-16 MED ORDER — ORAL CARE MOUTH RINSE: 15.0000 mL | Freq: Once | OROMUCOSAL | Status: AC

## 2023-11-16 MED ORDER — PROPOFOL 500 MG/50ML IV EMUL
INTRAVENOUS | Status: DC | PRN
  Administered 2023-11-16: 80 ug/kg/min via INTRAVENOUS

## 2023-11-16 MED ORDER — BUPIVACAINE HCL (PF) 0.5 % IJ SOLN
INTRAMUSCULAR | Status: DC | PRN
  Administered 2023-11-16: 3 mL

## 2023-11-16 MED ORDER — OXYCODONE HCL 5 MG/5ML PO SOLN: 5.0000 mg | Freq: Once | ORAL | Status: DC | PRN

## 2023-11-16 MED ORDER — FERROUS SULFATE 325 (65 FE) MG PO TABS
325.0000 mg | ORAL_TABLET | Freq: Two times a day (BID) | ORAL | Status: DC
  Administered 2023-11-17 – 2023-11-20 (×6): 325 mg via ORAL
  Filled 2023-11-16 (×6): qty 1

## 2023-11-16 MED ORDER — PHENYLEPHRINE HCL-NACL 20-0.9 MG/250ML-% IV SOLN
INTRAVENOUS | Status: AC
  Filled 2023-11-16: qty 250

## 2023-11-16 MED ORDER — OXYCODONE HCL 5 MG PO TABS
10.0000 mg | ORAL_TABLET | ORAL | Status: DC | PRN
  Administered 2023-11-16 (×3): 10 mg via ORAL
  Filled 2023-11-16: qty 2

## 2023-11-16 MED ORDER — OXYCODONE HCL 5 MG PO TABS: 5.0000 mg | ORAL_TABLET | Freq: Once | ORAL | Status: DC | PRN

## 2023-11-16 MED ORDER — ONDANSETRON HCL 4 MG PO TABS: 4.0000 mg | ORAL_TABLET | Freq: Four times a day (QID) | ORAL | Status: DC | PRN

## 2023-11-16 MED ORDER — CEFAZOLIN SODIUM-DEXTROSE 2-4 GM/100ML-% IV SOLN
2.0000 g | Freq: Four times a day (QID) | INTRAVENOUS | Status: AC
  Administered 2023-11-16 (×2): 2 g via INTRAVENOUS
  Filled 2023-11-16 (×3): qty 100

## 2023-11-16 MED ORDER — TRANEXAMIC ACID-NACL 1000-0.7 MG/100ML-% IV SOLN
1000.0000 mg | Freq: Once | INTRAVENOUS | Status: AC
  Administered 2023-11-16: 1000 mg via INTRAVENOUS

## 2023-11-16 MED ORDER — ENSURE PRE-SURGERY PO LIQD
296.0000 mL | Freq: Once | ORAL | Status: AC
  Administered 2023-11-16: 296 mL via ORAL
  Filled 2023-11-16: qty 296

## 2023-11-16 MED ORDER — KETAMINE HCL 50 MG/5ML IJ SOSY
PREFILLED_SYRINGE | INTRAMUSCULAR | Status: DC | PRN
  Administered 2023-11-16: 20 mg via INTRAVENOUS

## 2023-11-16 MED ORDER — CHLORHEXIDINE GLUCONATE 4 % EX SOLN: 60.0000 mL | Freq: Once | CUTANEOUS | Status: DC

## 2023-11-16 MED ORDER — UMECLIDINIUM BROMIDE 62.5 MCG/ACT IN AEPB
1.0000 | INHALATION_SPRAY | Freq: Every day | RESPIRATORY_TRACT | Status: DC
  Administered 2023-11-17 – 2023-11-20 (×4): 1 via RESPIRATORY_TRACT
  Filled 2023-11-16: qty 7

## 2023-11-16 MED ORDER — GABAPENTIN 300 MG PO CAPS
300.0000 mg | ORAL_CAPSULE | Freq: Once | ORAL | Status: AC
  Administered 2023-11-16: 300 mg via ORAL

## 2023-11-16 MED ORDER — ACETAMINOPHEN 325 MG PO TABS: 325.0000 mg | ORAL_TABLET | Freq: Four times a day (QID) | ORAL | Status: DC | PRN

## 2023-11-16 MED ORDER — FLUTICASONE FUROATE-VILANTEROL 100-25 MCG/ACT IN AEPB
1.0000 | INHALATION_SPRAY | Freq: Every day | RESPIRATORY_TRACT | Status: DC
  Filled 2023-11-16: qty 28

## 2023-11-16 MED ORDER — TRAMADOL HCL 50 MG PO TABS: 50.0000 mg | ORAL_TABLET | Freq: Four times a day (QID) | ORAL | 0 refills | Status: DC | PRN

## 2023-11-16 MED ORDER — FLEET ENEMA RE ENEM: 1.0000 | ENEMA | Freq: Once | RECTAL | Status: DC | PRN

## 2023-11-16 MED ORDER — MELOXICAM 15 MG PO TABS: 15.0000 mg | ORAL_TABLET | ORAL | 1 refills | Status: AC

## 2023-11-16 MED ORDER — ASPIRIN 81 MG PO TBEC: 81.0000 mg | DELAYED_RELEASE_TABLET | Freq: Two times a day (BID) | ORAL | Status: DC

## 2023-11-16 MED ORDER — LOSARTAN POTASSIUM-HCTZ 100-12.5 MG PO TABS: 1.0000 | ORAL_TABLET | ORAL | Status: DC

## 2023-11-16 MED ORDER — BUPIVACAINE LIPOSOME 1.3 % IJ SUSP
INTRAMUSCULAR | Status: AC
  Filled 2023-11-16: qty 20

## 2023-11-16 MED ORDER — FENTANYL CITRATE (PF) 100 MCG/2ML IJ SOLN
INTRAMUSCULAR | Status: DC | PRN
  Administered 2023-11-16 (×2): 50 ug via INTRAVENOUS

## 2023-11-16 MED ORDER — TRANEXAMIC ACID-NACL 1000-0.7 MG/100ML-% IV SOLN
INTRAVENOUS | Status: AC
  Filled 2023-11-16: qty 100

## 2023-11-16 MED ORDER — SODIUM CHLORIDE 0.9 % IR SOLN
Status: DC | PRN
  Administered 2023-11-16: 3000 mL

## 2023-11-16 MED ORDER — MAGNESIUM HYDROXIDE 400 MG/5ML PO SUSP
30.0000 mL | Freq: Every day | ORAL | Status: DC
  Administered 2023-11-16 – 2023-11-20 (×5): 30 mL via ORAL
  Filled 2023-11-16 (×5): qty 30

## 2023-11-16 MED ORDER — HYDROCHLOROTHIAZIDE 12.5 MG PO TABS
12.5000 mg | ORAL_TABLET | Freq: Every day | ORAL | Status: DC
  Administered 2023-11-17 – 2023-11-20 (×3): 12.5 mg via ORAL
  Filled 2023-11-16 (×3): qty 1

## 2023-11-16 MED ORDER — SODIUM CHLORIDE 0.9 % IV SOLN: INTRAVENOUS | Status: DC

## 2023-11-16 MED ORDER — FENTANYL CITRATE (PF) 100 MCG/2ML IJ SOLN
25.0000 ug | INTRAMUSCULAR | Status: DC | PRN
  Administered 2023-11-16 (×2): 50 ug via INTRAVENOUS

## 2023-11-16 MED ORDER — PROPOFOL 10 MG/ML IV BOLUS
INTRAVENOUS | Status: AC
  Filled 2023-11-16: qty 20

## 2023-11-16 MED ORDER — ALBUTEROL SULFATE HFA 108 (90 BASE) MCG/ACT IN AERS: 2.0000 | INHALATION_SPRAY | Freq: Four times a day (QID) | RESPIRATORY_TRACT | Status: DC | PRN

## 2023-11-16 MED ORDER — ALBUTEROL SULFATE (2.5 MG/3ML) 0.083% IN NEBU: 2.5000 mg | INHALATION_SOLUTION | Freq: Four times a day (QID) | RESPIRATORY_TRACT | Status: DC | PRN

## 2023-11-16 MED ORDER — ACETAMINOPHEN 10 MG/ML IV SOLN
INTRAVENOUS | Status: DC | PRN
  Administered 2023-11-16: 1000 mg via INTRAVENOUS

## 2023-11-16 MED ORDER — DEXMEDETOMIDINE HCL IN NACL 200 MCG/50ML IV SOLN
INTRAVENOUS | Status: DC | PRN
  Administered 2023-11-16 (×3): 4 ug via INTRAVENOUS

## 2023-11-16 MED ORDER — CHLORHEXIDINE GLUCONATE 0.12 % MT SOLN
OROMUCOSAL | Status: AC
  Filled 2023-11-16: qty 15

## 2023-11-16 MED ORDER — DEXAMETHASONE SODIUM PHOSPHATE 10 MG/ML IJ SOLN
INTRAMUSCULAR | Status: AC
  Filled 2023-11-16: qty 1

## 2023-11-16 MED ORDER — ISOSORBIDE MONONITRATE ER 30 MG PO TB24
30.0000 mg | ORAL_TABLET | Freq: Every day | ORAL | Status: DC
  Administered 2023-11-17: 30 mg via ORAL
  Filled 2023-11-16: qty 1

## 2023-11-16 MED ORDER — OXYCODONE HCL 5 MG PO TABS
5.0000 mg | ORAL_TABLET | ORAL | Status: DC | PRN
  Administered 2023-11-17: 5 mg via ORAL
  Filled 2023-11-16: qty 1

## 2023-11-16 MED ORDER — CELECOXIB 200 MG PO CAPS
ORAL_CAPSULE | ORAL | Status: AC
  Filled 2023-11-16: qty 2

## 2023-11-16 MED ORDER — PANTOPRAZOLE SODIUM 40 MG PO TBEC
40.0000 mg | DELAYED_RELEASE_TABLET | Freq: Two times a day (BID) | ORAL | Status: DC
  Administered 2023-11-16 – 2023-11-20 (×8): 40 mg via ORAL
  Filled 2023-11-16 (×8): qty 1

## 2023-11-16 MED ORDER — DIPHENHYDRAMINE HCL 12.5 MG/5ML PO ELIX
12.5000 mg | ORAL_SOLUTION | ORAL | Status: DC | PRN
Start: 2023-11-16 — End: 2023-11-20

## 2023-11-16 MED ORDER — ACETAMINOPHEN 10 MG/ML IV SOLN
INTRAVENOUS | Status: AC
  Filled 2023-11-16: qty 100

## 2023-11-16 MED ORDER — MENTHOL 3 MG MT LOZG: 1.0000 | LOZENGE | OROMUCOSAL | Status: DC | PRN

## 2023-11-16 MED ORDER — MIDAZOLAM HCL 5 MG/5ML IJ SOLN
INTRAMUSCULAR | Status: DC | PRN
  Administered 2023-11-16: 2 mg via INTRAVENOUS

## 2023-11-16 MED ORDER — PHENYLEPHRINE HCL-NACL 20-0.9 MG/250ML-% IV SOLN
INTRAVENOUS | Status: DC | PRN
  Administered 2023-11-16: 40 ug/min via INTRAVENOUS

## 2023-11-16 MED ORDER — EPHEDRINE SULFATE-NACL 50-0.9 MG/10ML-% IV SOSY
PREFILLED_SYRINGE | INTRAVENOUS | Status: DC | PRN
  Administered 2023-11-16: 10 mg via INTRAVENOUS
  Administered 2023-11-16: 5 mg via INTRAVENOUS

## 2023-11-16 MED ORDER — ASPIRIN 81 MG PO CHEW
81.0000 mg | CHEWABLE_TABLET | Freq: Two times a day (BID) | ORAL | Status: DC
  Administered 2023-11-16 – 2023-11-20 (×7): 81 mg via ORAL
  Filled 2023-11-16 (×7): qty 1

## 2023-11-16 SURGICAL SUPPLY — 66 items
ATTUNE MED DOME PAT 41 KNEE (Knees) IMPLANT
ATTUNE PS FEM LT SZ 7 CEM KNEE (Femur) IMPLANT
ATTUNE PSRP INSR SZ7 5 KNEE (Insert) IMPLANT
BASE TIBIAL ROT PLAT SZ 8 KNEE (Knees) IMPLANT
BATTERY INSTRU NAVIGATION (MISCELLANEOUS) ×4 IMPLANT
BIT DRILL QUICK REL 1/8 2PK SL (BIT) ×1 IMPLANT
BLADE CLIPPER SURG (BLADE) IMPLANT
BLADE SAW 70X12.5 (BLADE) ×1 IMPLANT
BLADE SAW 90X13X1.19 OSCILLAT (BLADE) ×1 IMPLANT
BLADE SAW 90X25X1.19 OSCILLAT (BLADE) ×1 IMPLANT
BONE CEMENT GENTAMICIN (Cement) ×2 IMPLANT
BRUSH SCRUB EZ PLAIN DRY (MISCELLANEOUS) ×1 IMPLANT
CEMENT BONE GENTAMICIN 40 (Cement) IMPLANT
COOLER POLAR GLACIER W/PUMP (MISCELLANEOUS) ×1 IMPLANT
CUFF TRNQT CYL 24X4X16.5-23 (TOURNIQUET CUFF) IMPLANT
CUFF TRNQT CYL 30X4X21-28X (TOURNIQUET CUFF) IMPLANT
DRAPE SHEET LG 3/4 BI-LAMINATE (DRAPES) ×1 IMPLANT
DRSG AQUACEL AG ADV 3.5X14 (GAUZE/BANDAGES/DRESSINGS) ×1 IMPLANT
DRSG MEPILEX SACRM 8.7X9.8 (GAUZE/BANDAGES/DRESSINGS) ×1 IMPLANT
DRSG TEGADERM 4X4.75 (GAUZE/BANDAGES/DRESSINGS) ×1 IMPLANT
DURAPREP 26ML APPLICATOR (WOUND CARE) ×2 IMPLANT
ELECT CAUTERY BLADE 6.4 (BLADE) ×1 IMPLANT
ELECT REM PT RETURN 9FT ADLT (ELECTROSURGICAL) ×1
ELECTRODE REM PT RTRN 9FT ADLT (ELECTROSURGICAL) ×1 IMPLANT
EVACUATOR 1/8 PVC DRAIN (DRAIN) ×1 IMPLANT
EX-PIN ORTHOLOCK NAV 4X150 (PIN) ×2 IMPLANT
GAUZE XEROFORM 1X8 LF (GAUZE/BANDAGES/DRESSINGS) ×1 IMPLANT
GLOVE BIOGEL M STRL SZ7.5 (GLOVE) ×6 IMPLANT
GLOVE SURG UNDER POLY LF SZ8 (GLOVE) ×2 IMPLANT
GOWN STRL REUS W/ TWL LRG LVL3 (GOWN DISPOSABLE) ×1 IMPLANT
GOWN STRL REUS W/ TWL XL LVL3 (GOWN DISPOSABLE) ×1 IMPLANT
GOWN TOGA ZIPPER T7+ PEEL AWAY (MISCELLANEOUS) ×1 IMPLANT
HOLDER FOLEY CATH W/STRAP (MISCELLANEOUS) ×1 IMPLANT
HOOD PEEL AWAY T7 (MISCELLANEOUS) ×1 IMPLANT
IV NS IRRIG 3000ML ARTHROMATIC (IV SOLUTION) ×1 IMPLANT
KIT TURNOVER KIT A (KITS) ×1 IMPLANT
KNIFE SCULPS 14X20 (INSTRUMENTS) ×1 IMPLANT
MANIFOLD NEPTUNE II (INSTRUMENTS) ×2 IMPLANT
NDL SPNL 20GX3.5 QUINCKE YW (NEEDLE) ×2 IMPLANT
NEEDLE SPNL 20GX3.5 QUINCKE YW (NEEDLE) ×2
PACK TOTAL KNEE (MISCELLANEOUS) ×1 IMPLANT
PAD ABD DERMACEA PRESS 5X9 (GAUZE/BANDAGES/DRESSINGS) ×2 IMPLANT
PAD ARMBOARD 7.5X6 YLW CONV (MISCELLANEOUS) ×3 IMPLANT
PAD WRAPON POLAR KNEE (MISCELLANEOUS) ×1 IMPLANT
PENCIL SMOKE EVACUATOR COATED (MISCELLANEOUS) ×1 IMPLANT
PIN DRILL FIX HALF THREAD (BIT) ×2 IMPLANT
PIN FIXATION 1/8DIA X 3INL (PIN) ×1 IMPLANT
PULSAVAC PLUS IRRIG FAN TIP (DISPOSABLE) ×1
SOLUTION IRRIG SURGIPHOR (IV SOLUTION) ×1 IMPLANT
SPONGE DRAIN TRACH 4X4 STRL 2S (GAUZE/BANDAGES/DRESSINGS) ×1 IMPLANT
STAPLER SKIN PROX 35W (STAPLE) ×1 IMPLANT
STOCKINETTE STRL BIAS CUT 8X4 (MISCELLANEOUS) ×1 IMPLANT
STRAP TIBIA SHORT (MISCELLANEOUS) ×1 IMPLANT
SUCTION TUBE FRAZIER 10FR DISP (SUCTIONS) ×1 IMPLANT
SUT VIC AB 0 CT1 36 (SUTURE) ×1 IMPLANT
SUT VIC AB 1 CT1 36 (SUTURE) ×2 IMPLANT
SUT VIC AB 2-0 CT2 27 (SUTURE) ×1 IMPLANT
SYR 30ML LL (SYRINGE) ×2 IMPLANT
TIBIAL BASE ROT PLAT SZ 8 KNEE (Knees) ×1 IMPLANT
TIP FAN IRRIG PULSAVAC PLUS (DISPOSABLE) ×1 IMPLANT
TOWEL OR 17X26 4PK STRL BLUE (TOWEL DISPOSABLE) ×1 IMPLANT
TOWER CARTRIDGE SMART MIX (DISPOSABLE) ×1 IMPLANT
TRAP FLUID SMOKE EVACUATOR (MISCELLANEOUS) ×1 IMPLANT
TRAY FOLEY MTR SLVR 16FR STAT (SET/KITS/TRAYS/PACK) ×1 IMPLANT
WATER STERILE IRR 1000ML POUR (IV SOLUTION) ×1 IMPLANT
WRAPON POLAR PAD KNEE (MISCELLANEOUS) ×1

## 2023-11-16 NOTE — TOC Progression Note (Signed)
 Transition of Care [facility]) - Progression Note    Patient Details  Name: Joe Dillon MRN: [id number] Date of Birth: April 22, 1956  [facility] of [facility]) CM/SW Contact  Royanne JINNY Bernheim, RN Phone Number: 11/16/2023, 3:45 PM  Clinical Narrative:    The patient is set up with [facility] prior to [facility] for [facility] 3 in1 to be delivered to the bedside by [facility]        Expected Discharge Plan and Services                                               Social Determinants of Health (SDOH) Interventions SDOH Screenings   Food Insecurity: No Food Insecurity (11/16/2023)  Housing: Unknown (11/16/2023)  Transportation Needs: No Transportation Needs (11/16/2023)  Utilities: Not At Risk (11/16/2023)  Alcohol Screen: Low Risk  (09/20/2021)  Depression (PHQ2-9): Low Risk  (09/20/2021)  Financial Resource Strain: Low Risk  (12/22/2020)  Physical Activity: Inactive (12/22/2020)  Social Connections: Moderately Integrated (11/16/2023)  Stress: No Stress Concern Present (12/22/2020)  Tobacco Use: Medium Risk (11/16/2023)    Readmission Risk Interventions     No data to display

## 2023-11-16 NOTE — Transfer of Care (Signed)
 Immediate Anesthesia Transfer of Care Note  Patient: Joe Dillon  Procedure(s) Performed: COMPUTER ASSISTED TOTAL KNEE ARTHROPLASTY (Left: Knee)  Patient Location: PACU  Anesthesia Type:Spinal and MAC combined with regional for post-op pain  Level of Consciousness: awake and alert   Airway & Oxygen Therapy: Patient Spontanous Breathing  Post-op Assessment: Report given to RN and Post -op Vital signs reviewed and stable  Post vital signs: stable  Last Vitals:  Vitals Value Taken Time  BP 111/70 11/16/23 1055  Temp 97.8   Pulse 94 11/16/23 1057  Resp 19 11/16/23 1057  SpO2 92 % 11/16/23 1057  Vitals shown include unfiled device data.  Last Pain:  Vitals:   11/16/23 0619  TempSrc: Temporal  PainSc: 6       Patients Stated Pain Goal: 0 (11/16/23 9380)  Complications: No notable events documented.

## 2023-11-16 NOTE — Anesthesia Preprocedure Evaluation (Addendum)
 Anesthesia Evaluation  Patient identified by MRN, date of birth, ID band Patient awake    Reviewed: Allergy & Precautions, NPO status , Patient's Chart, lab work & pertinent test results  History of Anesthesia Complications Negative for: history of anesthetic complications  Airway Mallampati: III   Neck ROM: Full    Dental no notable dental hx.    Pulmonary asthma , COPD, former smoker (quit 08/2023)   Pulmonary exam normal breath sounds clear to auscultation       Cardiovascular hypertension, + CAD  Normal cardiovascular exam Rhythm:Regular Rate:Normal  ECG 11/12/23:  Normal sinus rhythm Nonspecific ST abnormality  Myocardial perfusion 10/22/23:   Mildly abnormal myocardial perfusion scan is area of inferior apical septal persistent defect no clear evidence of reversible ischemia.  This could represent scarring consistent with coronary disease consider other modality for further evaluation either invasive or other imaging study ejection fraction is around 52%.  This is a moderate risk scan   Echo 03/01/23:  NORMAL LEFT VENTRICULAR SYSTOLIC FUNCTION  NORMAL RIGHT VENTRICULAR SYSTOLIC FUNCTION  TRIVIAL REGURGITATION NOTED  NO VALVULAR STENOSIS  ESTIMATED LVEF >55%    Neuro/Psych negative neurological ROS     GI/Hepatic ,GERD  Medicated and Controlled,,  Endo/Other  negative endocrine ROS    Renal/GU negative Renal ROS     Musculoskeletal  (+) Arthritis ,    Abdominal   Peds  Hematology negative hematology ROS (+)   Anesthesia Other Findings Cardiology note 10/11/23:  68 y.o. male with  1. Chest pain, unspecified type  2. Coronary artery disease, unspecified vessel or lesion type, unspecified whether angina present, unspecified whether native or transplanted heart  3. SOB (shortness of breath)  4. Panlobular emphysema (CMS/HHS-HCC)  5. Benign essential HTN  6. Mixed hyperlipidemia  7. Tobacco abuse  8.  Gastroesophageal reflux disease, unspecified whether esophagitis present  9. Equivalent angina (CMS-HCC)  10. Shortness of breath  11. Hypoxemia   Plan  Smoking by history patient states she is quit over 2 months ago continue to advised patient refrain from tobacco abuse Coronary artery disease on CTA but negative FFR here for routine follow-up will consider Imdur  30 mg milligrams a day also consider functional study COPD mild continue inhalers consider follow-up with pulmonary GERD type symptoms Nexium therapy for reflux type symptoms Hypertension reasonably controlled amlodipine  losartan  HCTZ Hyperlipidemia continue Crestor  therapy for lipid management lipid and liver studies as per primary And general syndrome with add Imdur  follow-up with a functional study Lexiscan  Myoview for further evaluation Shortness of breath dyspnea on exertion unclear etiology may be COPD may be related to smoking consider possible anginal equivalent continue inhalers follow-up with pulmonary Have the patient follow-up in 6 months  Return in about 6 months (around 04/10/2024).    Reproductive/Obstetrics                             Anesthesia Physical Anesthesia Plan  ASA: 3  Anesthesia Plan: General and Spinal   Post-op Pain Management:    Induction: Intravenous  PONV Risk Score and Plan: 2 and Propofol  infusion, TIVA, Treatment may vary due to age or medical condition and Ondansetron   Airway Management Planned: Natural Airway and Nasal Cannula  Additional Equipment:   Intra-op Plan:   Post-operative Plan:   Informed Consent: I have reviewed the patients History and Physical, chart, labs and discussed the procedure including the risks, benefits and alternatives for the proposed anesthesia with the patient  or authorized representative who has indicated his/her understanding and acceptance.       Plan Discussed with: CRNA  Anesthesia Plan Comments: (Plan for spinal and  GA with natural airway, LMA/GETA backup.  Patient consented for risks of anesthesia including but not limited to:  - adverse reactions to medications - damage to eyes, teeth, lips or other oral mucosa - nerve damage due to positioning  - sore throat or hoarseness - headache, bleeding, infection, nerve damage 2/2 spinal - damage to heart, brain, nerves, lungs, other parts of body or loss of life  Informed patient about role of CRNA in peri- and intra-operative care.  Patient voiced understanding.)        Anesthesia Quick Evaluation

## 2023-11-16 NOTE — Progress Notes (Signed)
 Patient up in recliner, upset regarding wait for bed assignment. Patient and wife updated. Will continue to keep updated.

## 2023-11-16 NOTE — Anesthesia Postprocedure Evaluation (Signed)
 Anesthesia Post Note  Patient: Aziel D Dorvil  Procedure(s) Performed: COMPUTER ASSISTED TOTAL KNEE ARTHROPLASTY (Left: Knee)  Patient location during evaluation: PACU Anesthesia Type: Spinal Level of consciousness: awake and alert, oriented and patient cooperative Pain management: pain level controlled Vital Signs Assessment: post-procedure vital signs reviewed and stable Respiratory status: spontaneous breathing, nonlabored ventilation and respiratory function stable Cardiovascular status: blood pressure returned to baseline and stable Postop Assessment: adequate PO intake, no headache, no backache and spinal receding Anesthetic complications: no   No notable events documented.   Last Vitals:  Vitals:   11/16/23 0619 11/16/23 1107  BP: (!) 116/94 111/70  Pulse: 78 92  Resp: 18 15  Temp: 36.8 C 36.8 C  SpO2: 94% 93%    Last Pain:  Vitals:   11/16/23 1115  TempSrc:   PainSc: 6                  Alfonso Ruths

## 2023-11-16 NOTE — Op Note (Signed)
 OPERATIVE NOTE  DATE OF SURGERY:  11/16/2023  PATIENT NAME:  LEONEL MCCOLLUM   DOB: November 26, 1955  MRN: [id number]  PRE-OPERATIVE DIAGNOSIS: Degenerative arthrosis of the left knee, primary  POST-OPERATIVE DIAGNOSIS:  Same  PROCEDURE:  Left total knee arthroplasty using computer-assisted navigation  SURGEON:  Lynwood SHAUNNA Mardee Mickey. M.D.  ASSISTANT:  Sidra Koyanagi, PA-C (present and scrubbed throughout the case, critical for assistance with exposure, retraction, instrumentation, and closure)  ANESTHESIA: spinal  ESTIMATED BLOOD LOSS: 50 mL  FLUIDS REPLACED: 1100 mL of crystalloid  TOURNIQUET TIME: 82 minutes  DRAINS: 2 medium Hemovac drains  SOFT TISSUE RELEASES: Anterior cruciate ligament, posterior cruciate ligament, deep medial collateral ligament, patellofemoral ligament  IMPLANTS UTILIZED: DePuy Attune size 7 posterior stabilized femoral component (cemented), size 8 rotating platform tibial component (cemented), 41 mm medialized dome patella (cemented), and a 5 mm stabilized rotating platform polyethylene insert.  INDICATIONS FOR SURGERY: BABAK LUCUS is a 68 y.o. year old male with a long history of progressive knee pain. X-rays demonstrated severe degenerative changes in tricompartmental fashion. The patient had not seen any significant improvement despite conservative nonsurgical intervention. After discussion of the risks and benefits of surgical intervention, the patient expressed understanding of the risks benefits and agree with plans for total knee arthroplasty.   The risks, benefits, and alternatives were discussed at length including but not limited to the risks of infection, bleeding, nerve injury, stiffness, blood clots, the need for revision surgery, cardiopulmonary complications, among others, and they were willing to proceed.  PROCEDURE IN DETAIL: The patient was brought into the operating room and, after adequate spinal anesthesia was achieved, a tourniquet was  placed on the patient's upper thigh. The patient's knee and leg were cleaned and prepped with alcohol and DuraPrep and draped in the usual sterile fashion. A timeout was performed as per usual protocol. The lower extremity was exsanguinated using an Esmarch, and the tourniquet was inflated to 300 mmHg. An anterior longitudinal incision was made followed by a standard mid vastus approach. The deep fibers of the medial collateral ligament were elevated in a subperiosteal fashion off of the medial flare of the tibia so as to maintain a continuous soft tissue sleeve. The patella was subluxed laterally and the patellofemoral ligament was incised. Inspection of the knee demonstrated severe degenerative changes with full-thickness loss of articular cartilage. Osteophytes were debrided using a rongeur. Anterior and posterior cruciate ligaments were excised. Two 4.0 mm Schanz pins were inserted in the femur and into the tibia for attachment of the array of trackers used for computer-assisted navigation. Hip center was identified using a circumduction technique. Distal landmarks were mapped using the computer. The distal femur and proximal tibia were mapped using the computer. The distal femoral cutting guide was positioned using computer-assisted navigation so as to achieve a 5 distal valgus cut. The femur was sized and it was felt that a size 7 femoral component was appropriate. A size 7 femoral cutting guide was positioned and the anterior cut was performed and verified using the computer. This was followed by completion of the posterior and chamfer cuts. Femoral cutting guide for the central box was then positioned in the center box cut was performed.  Attention was then directed to the proximal tibia. Medial and lateral menisci were excised. The extramedullary tibial cutting guide was positioned using computer-assisted navigation so as to achieve a 0 varus-valgus alignment and 3 posterior slope. The cut was  performed and verified using the computer. The proximal tibia  was sized and it was felt that a size 8 tibial tray was appropriate. Tibial and femoral trials were inserted followed by insertion of a 5 mm polyethylene insert. This allowed for excellent mediolateral soft tissue balancing both in flexion and in full extension. Finally, the patella was cut and prepared so as to accommodate a 41 mm medialized dome patella. A patella trial was placed and the knee was placed through a range of motion with excellent patellar tracking appreciated. The femoral trial was removed after debridement of posterior osteophytes. The central post-hole for the tibial component was reamed followed by insertion of a keel punch. Tibial trials were then removed. Cut surfaces of bone were irrigated with copious amounts of normal saline using pulsatile lavage and then suctioned dry. Polymethylmethacrylate cement with gentamicin was prepared in the usual fashion using a vacuum mixer. Cement was applied to the cut surface of the proximal tibia as well as along the undersurface of a size 8 rotating platform tibial component. Tibial component was positioned and impacted into place. Excess cement was removed using Personal assistant. Cement was then applied to the cut surfaces of the femur as well as along the posterior flanges of the size 7 femoral component. The femoral component was positioned and impacted into place. Excess cement was removed using Personal assistant. A 7 mm polyethylene trial was inserted and the knee was brought into full extension with steady axial compression applied. Finally, cement was applied to the backside of a 41 mm medialized dome patella and the patellar component was positioned and patellar clamp applied. Excess cement was removed using Personal assistant. After adequate curing of the cement, the tourniquet was deflated after a total tourniquet time of 82 minutes. Hemostasis was achieved using electrocautery. The knee was  irrigated with copious amounts of normal saline using pulsatile lavage followed by 450 ml of Surgiphor and then suctioned dry. 20 mL of 1.3% Exparel  and 60 mL of 0.25% Marcaine  in 40 mL of normal saline was injected along the posterior capsule, medial and lateral gutters, and along the arthrotomy site. A 5 mm stabilized rotating platform polyethylene insert was inserted and the knee was placed through a range of motion with excellent mediolateral soft tissue balancing appreciated and excellent patellar tracking noted. 2 medium drains were placed in the wound bed and brought out through separate stab incisions. The medial parapatellar portion of the incision was reapproximated using interrupted sutures of #1 Vicryl. Subcutaneous tissue was approximated in layers using first #0 Vicryl followed #2-0 Vicryl. The skin was approximated with skin staples. A sterile dressing was applied.  The patient tolerated the procedure well and was transported to the recovery room in stable condition.    Mossie Gilder P. Stephanny Tsutsui, [name], M.D.

## 2023-11-16 NOTE — Interval H&P Note (Signed)
 History and Physical Interval Note:  11/16/2023 6:04 AM  Joe Dillon  has presented today for surgery, with the diagnosis of PRIMARY OSTEOARTHRITIS OF LEFT KNEE..  The various methods of treatment have been discussed with the patient and family. After consideration of risks, benefits and other options for treatment, the patient has consented to  Procedure(s): COMPUTER ASSISTED TOTAL KNEE ARTHROPLASTY (Left) as a surgical intervention.  The patient's history has been reviewed, patient examined, no change in status, stable for surgery.  I have reviewed the patient's chart and labs.  Questions were answered to the patient's satisfaction.     Jawan Chavarria P Mykhia Danish

## 2023-11-16 NOTE — Evaluation (Signed)
 Physical Therapy Evaluation Patient Details Name: Joe Dillon MRN: [id number] DOB: 15-May-1956 Today's Date: 11/16/2023  History of Present Illness  Joe Dillon is a 67yoM who comes to [facility] on 11/16/23 for elective Left TKA. Pt was here for Rt TKA in October 2024.  Clinical Impression  Pt in PACU bay 7, ready for PT evaluation. Full return of sensation and motor function of LLE. Pain at 5/10. Pt moved to EOB quite well. RW used to come to standing, no assist needed, no safety issues, no cues needed. Pt tolerates several short AMB bouts overground with RW, then continues his journey for voiding of urine.No post-anesthesia issues. ROM looks appropriate. SLR performed without hesitation or grimace. Pt left up in recliner at terminus, heel elevated to achieve tolerable TKE ROM pursuit. Polar care in place as ordered. Will issue handout and execute stairs training next day.       If plan is discharge home, recommend the following: Assist for transportation;Help with stairs or ramp for entrance   Can travel by private vehicle        Equipment Recommendations BSC/3in1  Recommendations for Other Services       Functional Status Assessment Patient has had a recent decline in their functional status and demonstrates the ability to make significant improvements in function in a reasonable and predictable amount of time.     Precautions / Restrictions Precautions Precautions: Fall;Knee Restrictions Weight Bearing Restrictions Per Provider Order: Yes LLE Weight Bearing Per Provider Order: Weight bearing as tolerated      Mobility  Bed Mobility Overal bed mobility: Independent             General bed mobility comments: to left EOB    Transfers Overall transfer level: Needs assistance Equipment used: Rolling walker (2 wheels) Transfers: Sit to/from Stand Sit to Stand: Contact guard assist           General transfer comment: no LOB, no cues needed;     Ambulation/Gait Ambulation/Gait assistance: Contact guard assist Gait Distance (Feet): 250 Feet Assistive device: Rolling walker (2 wheels) Gait Pattern/deviations: Step-through pattern     Pre-gait activities: gait belt donning, hemovac suspension from RW General Gait Details: 2 short laps in room then a beeline to the BR and ultimately back to bay 7  Stairs Stairs:  (will visit at next PT session on POD1)          Wheelchair Mobility     Tilt Bed    Modified Rankin (Stroke Patients Only)       Balance Overall balance assessment: Modified Independent, No apparent balance deficits (not formally assessed)                                           Pertinent Vitals/Pain Pain Assessment Pain Assessment: 0-10 Pain Score: 5  Pain Location: Left knee Pain Descriptors / Indicators: Aching, Operative site guarding Pain Intervention(s): Limited activity within patient's tolerance, Monitored during session, Premedicated before session, Repositioned, Ice applied    Home Living Family/patient expects to be discharged to:: Private residence Living Arrangements: Spouse/significant other Available Help at Discharge: Family;Available 24 hours/day Type of Home: House Home Access: Stairs to enter Entrance Stairs-Rails: Left Entrance Stairs-Number of Steps: 2   Home Layout: One level Home Equipment: Agricultural Consultant (2 wheels)      Prior Function Prior Level of Function : Independent/Modified Independent  Mobility Comments: Ind amb community distances without an AD, plays golf regularly, no fall history ADLs Comments: Ind with ADLs     Extremity/Trunk Assessment                Communication      Cognition Arousal: Alert Behavior During Therapy: WFL for tasks assessed/performed Overall Cognitive Status: Within Functional Limits for tasks assessed                                          General Comments       Exercises Total Joint Exercises Hip ABduction/ADduction: AROM, Left, Supine Straight Leg Raises: AROM, Supine, Left Goniometric ROM: Left knee ROM 15- >80 degrees   Assessment/Plan    PT Assessment Patient needs continued PT services  PT Problem List Decreased strength;Decreased range of motion;Decreased activity tolerance;Decreased balance       PT Treatment Interventions DME instruction;Gait training;Neuromuscular re-education;Balance training;Stair training;Functional mobility training;Therapeutic activities;Therapeutic exercise;Patient/family education    PT Goals (Current goals can be found in the Care Plan section)  Acute Rehab PT Goals Patient Stated Goal: DC to home safely and return to gold one day PT Goal Formulation: With patient Time For Goal Achievement: 11/30/23 Potential to Achieve Goals: Good    Frequency BID     Co-evaluation               AM-PAC PT 6 Clicks Mobility  Outcome Measure Help needed turning from your back to your side while in a flat bed without using bedrails?: None Help needed moving from lying on your back to sitting on the side of a flat bed without using bedrails?: None Help needed moving to and from a bed to a chair (including a wheelchair)?: None Help needed standing up from a chair using your arms (e.g., wheelchair or bedside chair)?: None Help needed to walk in hospital room?: A Little Help needed climbing 3-5 steps with a railing? : A Little 6 Click Score: 22    End of Session Equipment Utilized During Treatment: Gait belt Activity Tolerance: Patient tolerated treatment well;No increased pain Patient left: in chair;with call bell/phone within reach Nurse Communication: Mobility status PT Visit Diagnosis: Other abnormalities of gait and mobility (R26.89);Muscle weakness (generalized) (M62.81);Difficulty in walking, not elsewhere classified (R26.2)    Time: 8656-8593 PT Time Calculation (min) (ACUTE ONLY): 23  min   Charges:   PT Evaluation $PT Eval Low Complexity: 1 Low PT Treatments $Therapeutic Activity: 8-22 mins PT General Charges $$ ACUTE PT VISIT: 1 Visit        3:41 PM, 11/16/23 Peggye JAYSON Linear, PT, DPT Physical Therapist - [facility]  [phone] (ASCOM)    Viktor Philipp C 11/16/2023, 3:38 PM

## 2023-11-16 NOTE — Progress Notes (Signed)
 Patient is not able to walk the distance required to go the bathroom, or he/she is unable to safely negotiate stairs required to access the bathroom.  A 3in1 BSC will alleviate this problem   Amenda Duclos P. Angie Fava M.D.

## 2023-11-16 NOTE — Anesthesia Procedure Notes (Signed)
 Spinal  Patient location during procedure: OR Start time: 11/16/2023 7:22 AM End time: 11/16/2023 7:26 AM Reason for block: surgical anesthesia Staffing Performed by: Gillermo Spruce I, CRNA Authorized by: Shellie Odor, MD   Preanesthetic Checklist Completed: patient identified, IV checked, site marked, risks and benefits discussed, surgical consent, monitors and equipment checked, pre-op evaluation and timeout performed Spinal Block Patient position: sitting Prep: ChloraPrep Location: L3-4 Injection technique: single-shot Needle Needle type: Pencan  Needle gauge: 24 G Needle length: 9 cm Needle insertion depth: 4 cm Assessment Sensory level: T8 Additional Notes Pt tolerated well

## 2023-11-16 NOTE — Progress Notes (Cosign Needed)
 Patient is not able to walk the distance required to go the bathroom, or he/she is unable to safely negotiate stairs required to access the bathroom.  A 3in1 BSC will alleviate this problem

## 2023-11-16 NOTE — Progress Notes (Signed)
 Patient awake/alert x4.  100% lunch meal eaten.  PT at bedside, patient ambulated in pacu without event.  Voided  At present in recliner

## 2023-11-17 ENCOUNTER — Encounter: Payer: Self-pay | Admitting: Orthopedic Surgery

## 2023-11-17 ENCOUNTER — Observation Stay: Payer: Medicare Other

## 2023-11-17 DIAGNOSIS — M1712 Unilateral primary osteoarthritis, left knee: Secondary | ICD-10-CM | POA: Diagnosis present

## 2023-11-17 DIAGNOSIS — J431 Panlobular emphysema: Secondary | ICD-10-CM | POA: Diagnosis present

## 2023-11-17 DIAGNOSIS — Z87891 Personal history of nicotine dependence: Secondary | ICD-10-CM | POA: Diagnosis not present

## 2023-11-17 DIAGNOSIS — Z860101 Personal history of adenomatous and serrated colon polyps: Secondary | ICD-10-CM | POA: Diagnosis not present

## 2023-11-17 DIAGNOSIS — I10 Essential (primary) hypertension: Secondary | ICD-10-CM

## 2023-11-17 DIAGNOSIS — D649 Anemia, unspecified: Secondary | ICD-10-CM | POA: Diagnosis not present

## 2023-11-17 DIAGNOSIS — I2510 Atherosclerotic heart disease of native coronary artery without angina pectoris: Secondary | ICD-10-CM | POA: Diagnosis present

## 2023-11-17 DIAGNOSIS — J4489 Other specified chronic obstructive pulmonary disease: Secondary | ICD-10-CM | POA: Diagnosis present

## 2023-11-17 DIAGNOSIS — Z7982 Long term (current) use of aspirin: Secondary | ICD-10-CM | POA: Diagnosis not present

## 2023-11-17 DIAGNOSIS — J449 Chronic obstructive pulmonary disease, unspecified: Secondary | ICD-10-CM | POA: Diagnosis not present

## 2023-11-17 DIAGNOSIS — I214 Non-ST elevation (NSTEMI) myocardial infarction: Secondary | ICD-10-CM | POA: Diagnosis present

## 2023-11-17 DIAGNOSIS — Z7902 Long term (current) use of antithrombotics/antiplatelets: Secondary | ICD-10-CM | POA: Diagnosis not present

## 2023-11-17 DIAGNOSIS — I25111 Atherosclerotic heart disease of native coronary artery with angina pectoris with documented spasm: Secondary | ICD-10-CM | POA: Diagnosis not present

## 2023-11-17 DIAGNOSIS — E785 Hyperlipidemia, unspecified: Secondary | ICD-10-CM | POA: Diagnosis present

## 2023-11-17 DIAGNOSIS — Z955 Presence of coronary angioplasty implant and graft: Secondary | ICD-10-CM | POA: Diagnosis not present

## 2023-11-17 DIAGNOSIS — Z96651 Presence of right artificial knee joint: Secondary | ICD-10-CM | POA: Diagnosis present

## 2023-11-17 DIAGNOSIS — R0789 Other chest pain: Secondary | ICD-10-CM | POA: Diagnosis not present

## 2023-11-17 DIAGNOSIS — K219 Gastro-esophageal reflux disease without esophagitis: Secondary | ICD-10-CM | POA: Insufficient documentation

## 2023-11-17 DIAGNOSIS — Z96652 Presence of left artificial knee joint: Secondary | ICD-10-CM

## 2023-11-17 DIAGNOSIS — Z79899 Other long term (current) drug therapy: Secondary | ICD-10-CM | POA: Diagnosis not present

## 2023-11-17 HISTORY — DX: Non-ST elevation (NSTEMI) myocardial infarction: I21.4

## 2023-11-17 LAB — TROPONIN I (HIGH SENSITIVITY)
Troponin I (High Sensitivity): 29 ng/L — ABNORMAL HIGH (ref <18)
Troponin I (High Sensitivity): 120 ng/L (ref <18)
Troponin I (High Sensitivity): 9868 ng/L (ref <18)
Troponin I (High Sensitivity): 9594 ng/L (ref <18)

## 2023-11-17 LAB — COMPREHENSIVE METABOLIC PANEL
ALT: 15 U/L (ref 0–44)
AST: 21 U/L (ref 15–41)
Albumin: 3.5 g/dL (ref 3.5–5.0)
Alkaline Phosphatase: 84 U/L (ref 38–126)
Anion gap: 6 (ref 5–15)
BUN: 21 mg/dL (ref 8–23)
CO2: 26 mmol/L (ref 22–32)
Calcium: 8.4 mg/dL — ABNORMAL LOW (ref 8.9–10.3)
Chloride: 103 mmol/L (ref 98–111)
Creatinine, Ser: 0.96 mg/dL (ref 0.61–1.24)
GFR, Estimated: >60 mL/min (ref >60)
Glucose, Bld: 149 mg/dL — ABNORMAL HIGH (ref 70–99)
Potassium: 4.1 mmol/L (ref 3.5–5.1)
Sodium: 135 mmol/L (ref 135–145)
Total Bilirubin: 0.3 mg/dL (ref 0.0–1.2)
Total Protein: 6.5 g/dL (ref 6.5–8.1)

## 2023-11-17 LAB — CBC
HCT: 29.9 % — ABNORMAL LOW (ref 39.0–52.0)
Hemoglobin: 9.7 g/dL — ABNORMAL LOW (ref 13.0–17.0)
MCH: 28.4 pg (ref 26.0–34.0)
MCHC: 32.4 g/dL (ref 30.0–36.0)
MCV: 87.7 fL (ref 80.0–100.0)
Platelets: 218 10*3/uL (ref 150–400)
RBC: 3.41 MIL/uL — ABNORMAL LOW (ref 4.22–5.81)
RDW: 12.5 % (ref 11.5–15.5)
WBC: 16.7 10*3/uL — ABNORMAL HIGH (ref 4.0–10.5)
nRBC: 0 % (ref 0.0–0.2)

## 2023-11-17 LAB — PROTIME-INR
INR: 1.1 (ref 0.8–1.2)
Prothrombin Time: 14.4 s (ref 11.4–15.2)

## 2023-11-17 LAB — APTT: aPTT: 25 s (ref 24–36)

## 2023-11-17 LAB — HEPARIN LEVEL (UNFRACTIONATED)
Heparin Unfractionated: 0.21 [IU]/mL — ABNORMAL LOW (ref 0.30–0.70)
Heparin Unfractionated: 0.19 [IU]/mL — ABNORMAL LOW (ref 0.30–0.70)

## 2023-11-17 LAB — D-DIMER, QUANTITATIVE: D-Dimer, Quant: 1.67 ug{FEU}/mL — ABNORMAL HIGH (ref 0.00–0.50)

## 2023-11-17 MED ORDER — NITROGLYCERIN 2 % TD OINT
1.0000 [in_us] | TOPICAL_OINTMENT | Freq: Four times a day (QID) | TRANSDERMAL | Status: DC
  Administered 2023-11-18 – 2023-11-20 (×8): 1 [in_us] via TOPICAL
  Filled 2023-11-17 (×10): qty 1

## 2023-11-17 MED ORDER — SODIUM CHLORIDE 0.9% FLUSH
3.0000 mL | Freq: Two times a day (BID) | INTRAVENOUS | Status: DC
  Administered 2023-11-18 – 2023-11-20 (×5): 3 mL via INTRAVENOUS

## 2023-11-17 MED ORDER — HEPARIN BOLUS VIA INFUSION
4000.0000 [IU] | Freq: Once | INTRAVENOUS | Status: AC
Start: 2023-11-17 — End: 2023-11-17
  Administered 2023-11-17: 4000 [IU] via INTRAVENOUS
  Filled 2023-11-17: qty 4000

## 2023-11-17 MED ORDER — METOPROLOL TARTRATE 50 MG PO TABS
50.0000 mg | ORAL_TABLET | Freq: Two times a day (BID) | ORAL | Status: DC
  Administered 2023-11-18 – 2023-11-20 (×5): 50 mg via ORAL
  Filled 2023-11-17 (×5): qty 1

## 2023-11-17 MED ORDER — HEPARIN BOLUS VIA INFUSION
1300.0000 [IU] | Freq: Once | INTRAVENOUS | Status: AC
  Administered 2023-11-17: 1300 [IU] via INTRAVENOUS
  Filled 2023-11-17: qty 1300

## 2023-11-17 MED ORDER — MORPHINE SULFATE (PF) 2 MG/ML IV SOLN
2.0000 mg | INTRAVENOUS | Status: DC | PRN
  Administered 2023-11-17: 2 mg via INTRAVENOUS
  Filled 2023-11-17 (×2): qty 1

## 2023-11-17 MED ORDER — METOPROLOL TARTRATE 25 MG PO TABS
25.0000 mg | ORAL_TABLET | Freq: Two times a day (BID) | ORAL | Status: DC
  Administered 2023-11-17 (×2): 25 mg via ORAL
  Filled 2023-11-17 (×2): qty 1

## 2023-11-17 MED ORDER — IOHEXOL 350 MG/ML SOLN
100.0000 mL | Freq: Once | INTRAVENOUS | Status: AC | PRN
  Administered 2023-11-17: 100 mL via INTRAVENOUS

## 2023-11-17 MED ORDER — HEPARIN (PORCINE) 25000 UT/250ML-% IV SOLN
1550.0000 [IU]/h | INTRAVENOUS | Status: DC
  Administered 2023-11-17: 1150 [IU]/h via INTRAVENOUS
  Administered 2023-11-17: 1300 [IU]/h via INTRAVENOUS
  Administered 2023-11-18 – 2023-11-19 (×2): 1550 [IU]/h via INTRAVENOUS
  Filled 2023-11-17 (×4): qty 250

## 2023-11-17 MED ORDER — HEPARIN BOLUS VIA INFUSION
2600.0000 [IU] | Freq: Once | INTRAVENOUS | Status: AC
  Administered 2023-11-17: 2600 [IU] via INTRAVENOUS
  Filled 2023-11-17: qty 2600

## 2023-11-17 MED ORDER — NITROGLYCERIN 0.4 MG SL SUBL
0.4000 mg | SUBLINGUAL_TABLET | SUBLINGUAL | Status: DC | PRN
  Administered 2023-11-17 (×3): 0.4 mg via SUBLINGUAL
  Filled 2023-11-17 (×3): qty 1
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
## 2023-11-17 NOTE — Assessment & Plan Note (Signed)
 Continue PPI therapy.

## 2023-11-17 NOTE — Progress Notes (Signed)
 Pt troponin elevated and notified b morrison and orders to tx to 2a.
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
## 2023-11-17 NOTE — Assessment & Plan Note (Signed)
-   We will continue antihypertensive therapy.

## 2023-11-17 NOTE — Progress Notes (Signed)
 OT Cancellation Note  Patient Details Name: Joe Dillon MRN: [id number] DOB: 01-16-1956   Cancelled Treatment:    Reason Eval/Treat Not Completed: Medical issues which prohibited therapy (Pt.had severe chest pain last night. Pt. with SOB, and elevated HR with minimal activity this morning. Cardiology to be consulted. Will monitor, and perform the initial OT eval  at a later time/date following the cardiology consult.)  Richardson Otter, MS, OTR/L 11/17/2023, 10:14 AM

## 2023-11-17 NOTE — Assessment & Plan Note (Addendum)
-   The patient will be placed on a telemetry monitor. - We have also troponins and place him on as needed IV morphine  sulfate and sublingual nitroglycerin . - We will place the patient on IV heparin . - When surgically feasible aspirin  can be added. - We will continue high-dose statin therapy with his Crestor . - We added beta-blocker therapy with Lopressor . - We will continue ARB therapy as well as Imdur .. - Cardiology consult and 2D echo will be obtained. - I notified Dr. Ammon about the patient.

## 2023-11-17 NOTE — Assessment & Plan Note (Signed)
-   We will hold off long acting beta agonist and continue as needed albuterol.

## 2023-11-17 NOTE — Progress Notes (Addendum)
 Please review consultation note from earlier this morning.  Patient seen and examined.  Patient was hospitalized for total left knee arthroplasty.  After surgery during the course of the night patient developed chest tightness.  This resolved with nitroglycerin .  Troponin levels were noted to be elevated.  Hospitalist was consulted.  Subsequently cardiology consulted.  Patient currently denies any chest tightness.  Denies any nausea vomiting.  No shortness of breath.  Vital signs reviewed.  Noted to be stable.  Lungs are clear to auscultation bilaterally. S1-S2 is normal regular.  No S3-S4.  No rubs murmurs or bruit. Abdomen is soft.  Nontender nondistended.  Labs reviewed.  Elevated WBC likely reactive.  Troponin levels only mildly elevated. CT angiogram did not show any PE.  EKG did not show any ST elevations.  Patient admitted for left knee replacement.  Developed chest pain with elevated troponin.  Concern is for NSTEMI.  Cardiology has been consulted.  Continue with aspirin .  Noted to be on heparin .  Chest pain resolved with nitroglycerin .  Patient noted to be on beta-blocker.  Noted to be on statin.  Will check lipid panel.  Other issues as per the consultation note.  Orthopedics is managing the left knee which is status post replacement.  Madelyne Millikan 11/17/2023

## 2023-11-17 NOTE — Progress Notes (Signed)
       CROSS COVER NOTE  NAME: Joe Dillon MRN: [id number] DOB : [birth year] ATTENDING PHYSICIAN: [name], MD    Date of Service   11/17/2023   HPI/Events of Note   Troponin 9868 reported from [facility]. Patient without chest pain. Patient already on heparin  drip. Cardiology already consulted   Interventions   Assessment/Plan: EKG - ST depression inferior leads new from 1/6 EKG Transfer to 2A      Erminio LITTIE Cone NP [facility] Cross Cover 7pm-7am - check amion for availability Pager [phone]
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
## 2023-11-17 NOTE — Assessment & Plan Note (Signed)
-   We will continue statin therapy, Imdur and ARB

## 2023-11-17 NOTE — Care Management Obs Status (Signed)
 MEDICARE OBSERVATION STATUS NOTIFICATION   Patient Details  Name: Joe Dillon MRN: [id number] Date of Birth: [birth year]   Medicare Observation Status Notification Given:  Yes  TOC unable to obtain physical signature due to offiste coverage. CSW reviewed with patient who verbalized understanding. RN Mikal Basques) is obtaining physical signature at bedside.  Signed copy being placed in patient's hard chart.  Copy provided for patient's records.  Odella Ku, RN 11/17/2023, 11:04 AM

## 2023-11-17 NOTE — Assessment & Plan Note (Signed)
 Pain management per Ortho

## 2023-11-17 NOTE — Progress Notes (Addendum)
 Pt was long sitting in bed upon arrival. Overnight events noted. Pt not c/o chest pain presently.  I feel alright now. Not hurting like it was last evening. Heprin was actively running. Pt eager to get OOB to use BR for BM. Pt easily able to get OOB to [facility] without assistance or cues. Unsuccessful BM. SOB noted with minimal OOB activity. HR up to 120bpm. Pt returned to long sitting in bed. Will await cardiology consult/recs prior to pushing further mobility, gait, and activity. DC recs for home remain appropriate once cleared medically.     Returned at 1644: pt still awaiting cardiac consult. Has been tachycardic. Acute PT will continue to follow and progress as able per current POC. Pt moves well from a PT standpoint. Awaiting clearance to progress activity with new symptoms onset last evening.
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
## 2023-11-18 ENCOUNTER — Inpatient Hospital Stay
Admission: RE | Admit: 2023-11-18 | Discharge: 2023-11-18 | Disposition: A | Discharge status: Home / Self Care | Payer: Medicare Other | Source: Home / Self Care | Attending: Internal Medicine | Admitting: Internal Medicine

## 2023-11-18 DIAGNOSIS — D649 Anemia, unspecified: Secondary | ICD-10-CM

## 2023-11-18 DIAGNOSIS — I10 Essential (primary) hypertension: Secondary | ICD-10-CM | POA: Diagnosis not present

## 2023-11-18 DIAGNOSIS — I214 Non-ST elevation (NSTEMI) myocardial infarction: Secondary | ICD-10-CM | POA: Diagnosis not present

## 2023-11-18 LAB — BASIC METABOLIC PANEL
Anion gap: 9 (ref 5–15)
BUN: 27 mg/dL — ABNORMAL HIGH (ref 8–23)
CO2: 25 mmol/L (ref 22–32)
Calcium: 8.2 mg/dL — ABNORMAL LOW (ref 8.9–10.3)
Chloride: 100 mmol/L (ref 98–111)
Creatinine, Ser: 0.96 mg/dL (ref 0.61–1.24)
GFR, Estimated: >60 mL/min (ref >60)
Glucose, Bld: 85 mg/dL (ref 70–99)
Potassium: 4 mmol/L (ref 3.5–5.1)
Sodium: 134 mmol/L — ABNORMAL LOW (ref 135–145)

## 2023-11-18 LAB — CBC
HCT: 25.8 % — ABNORMAL LOW (ref 39.0–52.0)
Hemoglobin: 8.4 g/dL — ABNORMAL LOW (ref 13.0–17.0)
MCH: 29.2 pg (ref 26.0–34.0)
MCHC: 32.6 g/dL (ref 30.0–36.0)
MCV: 89.6 fL (ref 80.0–100.0)
Platelets: 172 10*3/uL (ref 150–400)
RBC: 2.88 MIL/uL — ABNORMAL LOW (ref 4.22–5.81)
RDW: 12.9 % (ref 11.5–15.5)
WBC: 8.9 10*3/uL (ref 4.0–10.5)
nRBC: 0 % (ref 0.0–0.2)

## 2023-11-18 LAB — LIPID PANEL
Cholesterol: 84 mg/dL (ref 0–200)
HDL: 44 mg/dL (ref >40)
LDL Cholesterol: 31 mg/dL (ref 0–99)
Total CHOL/HDL Ratio: 1.9 {ratio}
Triglycerides: 45 mg/dL (ref <150)
VLDL: 9 mg/dL (ref 0–40)

## 2023-11-18 LAB — HEPARIN LEVEL (UNFRACTIONATED)
Heparin Unfractionated: 0.53 [IU]/mL (ref 0.30–0.70)
Heparin Unfractionated: 0.47 [IU]/mL (ref 0.30–0.70)

## 2023-11-18 LAB — TROPONIN I (HIGH SENSITIVITY): Troponin I (High Sensitivity): 9934 ng/L (ref <18)

## 2023-11-18 MED ORDER — MAGNESIUM SULFATE 2 GM/50ML IV SOLN
2.0000 g | Freq: Once | INTRAVENOUS | Status: AC
  Administered 2023-11-18: 2 g via INTRAVENOUS
  Filled 2023-11-18: qty 50
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
## 2023-11-18 NOTE — Progress Notes (Signed)
*  PRELIMINARY RESULTS* Echocardiogram 2D Echocardiogram has been performed.  Carolyne Fiscal 11/18/2023, 4:10 PM

## 2023-11-18 NOTE — Progress Notes (Signed)
 [facility] PROGRESS NOTE   Joe Dillon FMW:[id number] DOB: 12-Apr-1956 DOA: 11/16/2023  PCP: Joe Dillon [name], MD  Brief History: 68 y.o. Caucasian male with medical history significant for osteoarthritis status post bilateral TKA with left TKA done during this hospital stay, COPD, coronary artery disease, diastolic dysfunction, GERD, hypertension and dyslipidemia.  Patient was doing well postoperatively when he started developing chest tightness when he was on the orthopedic floor.  Troponin levels were noted to be abnormal.  Hospitalist was consulted.  Subsequently cardiology was consulted.   Consultants: Cardiology  Procedures: None yet    Subjective/Interval History: Patient denies any chest pain currently or overnight.  No shortness of breath.  No nausea or vomiting.    Assessment/Plan:  NSTEMI CT angiogram was negative for PE. Troponin levels rose significantly overnight.  Patient remains on heparin  and aspirin .  Cardiology is following and managing.  Noted to be on beta-blockers and statin as well. LDL is 31. Further management per cardiology.  Status post left total knee arthroplasty Management per orthopedics.  Normocytic anemia Drop in hemoglobin is combination of dilution as well as surgery.  No overt blood loss noted.  He does have Hemovac in his left knee.  Check anemia panel.  Monitor counts daily since he is on IV heparin .  Essential hypertension Monitor blood pressures closely.  History of COPD Stable.   DVT Prophylaxis: Currently on IV heparin  Code Status: Full code Family Communication: Discussed with patient Disposition Plan: Anticipate discharge home when improved      Medications: Scheduled:  amLODipine   10 mg Oral Daily   aspirin   81 mg Oral BID   ferrous sulfate   325 mg Oral BID WC   losartan   100 mg Oral Daily   And   hydrochlorothiazide   12.5 mg Oral Daily   magnesium  hydroxide  30 mL Oral Daily   meloxicam   15 mg Oral  BH-q7a   metoCLOPramide   10 mg Oral TID AC & HS   metoprolol  tartrate  50 mg Oral BID   nitroGLYCERIN   1 inch Topical Q6H   pantoprazole   40 mg Oral BID   rosuvastatin   40 mg Oral BH-q7a   senna-docusate  1 tablet Oral BID   sodium chloride  flush  3 mL Intravenous Q12H   umeclidinium bromide   1 puff Inhalation Daily   Continuous:  sodium chloride  Stopped (11/17/23 0730)   heparin  1,550 Units/hr (11/18/23 0525)   PRN:acetaminophen , albuterol , alum & mag hydroxide-simeth, bisacodyl , diphenhydrAMINE , HYDROmorphone  (DILAUDID ) injection, menthol -cetylpyridinium **OR** phenol, morphine  injection, nitroGLYCERIN , ondansetron  **OR** ondansetron  (ZOFRAN ) IV, oxyCODONE , oxyCODONE , sodium phosphate , traMADol   Antibiotics: Anti-infectives (From admission, onward)    Start     Dose/Rate Route Frequency Ordered Stop   11/16/23 1300  ceFAZolin  (ANCEF ) IVPB 2g/100 mL premix        2 g 200 mL/hr over 30 Minutes Intravenous Every 6 hours 11/16/23 1145 11/16/23 2105   11/16/23 0615  ceFAZolin  (ANCEF ) IVPB 2g/100 mL premix        2 g 200 mL/hr over 30 Minutes Intravenous On call to O.R. 11/16/23 0608 11/16/23 0718       Objective:  Vital Signs  Vitals:   11/17/23 2031 11/18/23 0034 11/18/23 0429 11/18/23 0859  BP: 115/66 104/61 108/70 (!) 106/59  Pulse: 74 66 68 80  Resp: 18 16 20 18   Temp:  98.2 F (36.8 C) 98.3 F (36.8 C) 98.1 F (36.7 C)  TempSrc:  Oral Oral   SpO2: 96% 93% 91% 95%  Weight:  Height:        Intake/Output Summary (Last 24 hours) at 11/18/2023 0946 Last data filed at 11/18/2023 0833 Gross per 24 hour  Intake 1463.68 ml  Output 550 ml  Net 913.68 ml   Filed Weights   11/16/23 0619  Weight: 88.5 kg    General appearance: Awake alert.  In no distress Resp: Clear to auscultation bilaterally.  Normal effort Cardio: S1-S2 is normal regular.  No S3-S4.  No rubs murmurs or bruit GI: Abdomen is soft.  Nontender nondistended.  Bowel sounds are present normal.  No  masses organomegaly Extremities: No edema.  Full range of motion of lower extremities. Neurologic: Alert and oriented x3.  No focal neurological deficits.    Lab Results:  Data Reviewed: I have personally reviewed following labs and reports of the imaging studies  CBC: Recent Labs  Lab 11/12/23 1031 11/17/23 0643 11/18/23 0406  WBC 6.8 16.7* 8.9  HGB 11.9* 9.7* 8.4*  HCT 36.4* 29.9* 25.8*  MCV 89.0 87.7 89.6  PLT 217 218 172    Basic Metabolic Panel: Recent Labs  Lab 11/12/23 1031 11/17/23 0643 11/18/23 0406  NA 136 135 134*  K 3.7 4.1 4.0  CL 99 103 100  CO2 27 26 25   GLUCOSE 80 149* 85  BUN 22 21 27*  CREATININE 0.93 0.96 0.96  CALCIUM  8.7* 8.4* 8.2*    GFR: Estimated Creatinine Clearance: 82.5 mL/min (by C-G formula based on SCr of 0.96 mg/dL).  Liver Function Tests: Recent Labs  Lab 11/12/23 1031 11/17/23 0643  AST 17 21  ALT 19 15  ALKPHOS 99 84  BILITOT 0.5 0.3  PROT 7.2 6.5  ALBUMIN 4.0 3.5    Coagulation Profile: Recent Labs  Lab 11/17/23 0144  INR 1.1     Lipid Profile: Recent Labs    11/18/23 0406  CHOL 84  HDL 44  LDLCALC 31  TRIG 45  CHOLHDL 1.9     Recent Results (from the past 240 hours)  Surgical pcr screen     Status: None   Collection Time: 11/12/23 10:31 AM   Specimen: Nasal Mucosa; Nasal Swab  Result Value Ref Range Status   MRSA, PCR NEGATIVE NEGATIVE Final   Staphylococcus aureus NEGATIVE NEGATIVE Final    Comment: (NOTE) The Xpert SA Assay (FDA approved for NASAL specimens in patients 61 years of age and older), is one component of a comprehensive surveillance program. It is not intended to diagnose infection nor to guide or monitor treatment. Performed at [facility], [address], [place], [place] [address]       Radiology Studies: CT Angio Chest Pulmonary Embolism (PE) W or WO Contrast Result Date: 11/17/2023 CLINICAL DATA:  Status post left knee replacement surgery, with a positive  D-dimer suspected pulmonary embolism. EXAM: CT ANGIOGRAPHY CHEST WITH CONTRAST TECHNIQUE: Multidetector CT imaging of the chest was performed using the standard protocol during bolus administration of intravenous contrast. Multiplanar CT image reconstructions and MIPs were obtained to evaluate the vascular anatomy. RADIATION DOSE REDUCTION: This exam was performed according to the departmental dose-optimization program which includes automated exposure control, adjustment of the mA and/or kV according to patient size and/or use of iterative reconstruction technique. CONTRAST:  OMNIPAQUE  IOHEXOL  350 MG/ML SOLN COMPARISON:  CTA chest 02/27/2023 FINDINGS: Cardiovascular: No arterial embolism is seen. The pulmonary trunk is slightly prominent at 3 cm which suggests a degree of pulmonary hypertension, was previously 2.7 cm. There is mild aortic atherosclerosis. The great vessels are  unremarkable. No aneurysm, stenosis or dissection is seen. Pulmonary veins are nondistended. The cardiac size is normal. There is no pericardial effusion. There are scattered calcific plaques of the LAD and right coronary arteries. Mediastinum/Nodes: Stable few slightly prominent bilateral hilar lymph nodes, scattered borderline sized mediastinal nodes. No new or progressive adenopathy. The lower poles of the thyroid  gland, axillary spaces, thoracic trachea, thoracic esophagus are unremarkable. Lungs/Pleura: The lungs are moderately emphysematous with centrilobular changes predominating. Diffuse bronchial thickening. Small caliber central airways which could be due to respiration, reactive airways disease or bronchospasm. There are scattered linear scar-like opacities. There is no consolidation, effusion or pneumothorax. Upper Abdomen: No acute abnormality. Musculoskeletal: No chest wall abnormality. No acute or significant osseous findings. Old left clavicular fracture fixation plating is partially visible. Review of the MIP images  confirms the above findings. IMPRESSION: 1. No evidence of arterial embolus. 2. Slightly prominent pulmonary trunk at 3 cm suggesting a degree of pulmonary hypertension. 3. Aortic and coronary artery atherosclerosis. 4. Emphysema and bronchitis.  No focal pneumonia. 5. Small caliber central airways. Query whether due to respiration, bronchospasm or reactive airway disease. 6. Stable slightly prominent hilar and borderline sized mediastinal nodes. Aortic Atherosclerosis (ICD10-I70.0) and Emphysema (ICD10-J43.9). Electronically Signed   By: Francis Quam M.D.   On: 11/17/2023 04:16   DG Knee Left Port Result Date: 11/16/2023 CLINICAL DATA:  67 year old male status post knee replacement. EXAM: PORTABLE LEFT KNEE - 1-2 VIEW COMPARISON:  None Available. FINDINGS: AP and cross-table lateral views 1115 hours. Left total knee arthroplasty hardware appears intact and aligned. Suprapatellar joint space operative drain. Postoperative changes to the undersurface of the patella. Anterior skin staples. No unexpected osseous changes. IMPRESSION: Left total knee arthroplasty with no adverse features. Electronically Signed   By: VEAR Hurst M.D.   On: 11/16/2023 11:25       LOS: 1 day   Ronnesha Mester  [facility] Pager on [email or web address]  11/18/2023, 9:46 AM

## 2023-11-18 NOTE — Plan of Care (Signed)
  Problem: Education: Goal: Knowledge of General Education information will improve Description: Including pain rating scale, medication(s)/side effects and non-pharmacologic comfort measures Outcome: Progressing   Problem: Health Behavior/Discharge Planning: Goal: Ability to manage health-related needs will improve Outcome: Progressing   Problem: Clinical Measurements: Goal: Ability to maintain clinical measurements within normal limits will improve Outcome: Progressing Goal: Will remain free from infection Outcome: Progressing Goal: Diagnostic test results will improve Outcome: Progressing Goal: Respiratory complications will improve Outcome: Progressing Goal: Cardiovascular complication will be avoided Outcome: Progressing   Problem: Activity: Goal: Risk for activity intolerance will decrease Outcome: Progressing   Problem: Nutrition: Goal: Adequate nutrition will be maintained Outcome: Progressing   Problem: Coping: Goal: Level of anxiety will decrease Outcome: Progressing   Problem: Elimination: Goal: Will not experience complications related to bowel motility Outcome: Progressing Goal: Will not experience complications related to urinary retention Outcome: Progressing   Problem: Pain Management: Goal: General experience of comfort will improve Outcome: Progressing   Problem: Safety: Goal: Ability to remain free from injury will improve Outcome: Progressing   Problem: Skin Integrity: Goal: Risk for impaired skin integrity will decrease Outcome: Progressing   Problem: Education: Goal: Knowledge of the prescribed therapeutic regimen will improve Outcome: Progressing Goal: Individualized Educational Video(s) Outcome: Progressing   Problem: Activity: Goal: Ability to avoid complications of mobility impairment will improve Outcome: Progressing Goal: Range of joint motion will improve Outcome: Progressing   Problem: Clinical Measurements: Goal:  Postoperative complications will be avoided or minimized Outcome: Progressing   Problem: Pain Management: Goal: Pain level will decrease with appropriate interventions Outcome: Progressing   Problem: Skin Integrity: Goal: Will show signs of wound healing Outcome: Progressing

## 2023-11-18 NOTE — Evaluation (Signed)
 Occupational Therapy Evaluation Patient Details Name: Joe Dillon MRN: [id number] DOB: Aug 08, 1956 Today's Date: 11/18/2023   History of Present Illness Joe Dillon is a 67yoM who [facility] for elective Left TKA. Pt was here for Rt TKA in October 2024. PMH: COPD, coronary artery disease, diastolic dysfunction, GERD, hypertension and dyslipidemia.  Patient was doing well postoperatively when he started developing chest tightness; admitted for chest pain and anginal symptoms.   Clinical Impression   Joe Dillon was seen for limited OT evaluation this date. Prior to hospital admission, pt was IND. Pt lives with family. On arrival pt requesting to have BM, currently requires SBA + RW for ADL t/f ~10 ft and standing grooming tasks. HR 69 bpm with activity. Limited activity pending planned cardiac procedure next date. IND don/doff polar care in sitting, anticipate MOD A for compression sock mgmt. Educated on falls prevention and HEP (handout provided). Pt would benefit from skilled OT to address noted impairments and functional limitations (see below for any additional details). Upon hospital discharge, recommend no OT follow up.    If plan is discharge home, recommend the following: A little help with walking and/or transfers;A little help with bathing/dressing/bathroom    Functional Status Assessment  Patient has had a recent decline in their functional status and demonstrates the ability to make significant improvements in function in a reasonable and predictable amount of time.  Equipment Recommendations  None recommended by OT    Recommendations for Other Services       Precautions / Restrictions Precautions Precautions: Fall;Knee Restrictions Weight Bearing Restrictions Per Provider Order: Yes LLE Weight Bearing Per Provider Order: Weight bearing as tolerated      Mobility Bed Mobility Overal bed mobility: Independent                  Transfers Overall  transfer level: Needs assistance Equipment used: Rolling walker (2 wheels)                      Balance Overall balance assessment: Modified Independent, No apparent balance deficits (not formally assessed)                                         ADL either performed or assessed with clinical judgement   ADL Overall ADL's : Needs assistance/impaired                                       General ADL Comments: SBA + RW for ADL t/f ~10 ft and standing grooming tasks. IND don/doff polar care in sitting, anticipate MOD A for compression sock mgmt.      Pertinent Vitals/Pain Pain Assessment Pain Assessment: 0-10 Pain Score: 4  Pain Location: Left knee Pain Descriptors / Indicators: Aching, Operative site guarding Pain Intervention(s): Limited activity within patient's tolerance, Repositioned, Ice applied     Extremity/Trunk Assessment Upper Extremity Assessment Upper Extremity Assessment: Overall WFL for tasks assessed   Lower Extremity Assessment Lower Extremity Assessment: Defer to PT evaluation       Communication Communication Communication: No apparent difficulties   Cognition Arousal: Alert Behavior During Therapy: WFL for tasks assessed/performed Overall Cognitive Status: Within Functional Limits for tasks assessed  Home Living Family/patient expects to be discharged to:: Private residence Living Arrangements: Spouse/significant other Available Help at Discharge: Family;Available 24 hours/day Type of Home: House Home Access: Stairs to enter [employer] of Steps: 2 Entrance Stairs-Rails: Left Home Layout: One level     Bathroom Shower/Tub: Walk-in shower         Home Equipment: [employer] (2 wheels)          Prior Functioning/Environment Prior Level of Function : Independent/Modified Independent             Mobility  Comments: Ind amb community distances without an AD, plays golf regularly, no fall history ADLs Comments: Ind with ADLs        OT Problem List: Decreased activity tolerance;Decreased range of motion      OT Treatment/Interventions: Self-care/ADL training;Therapeutic exercise;Energy conservation;DME and/or AE instruction;Therapeutic activities    OT Goals(Current goals can be found in the care plan section) Acute Rehab OT Goals Patient Stated Goal: to go home OT Goal Formulation: With patient Time For Goal Achievement: 12/02/23 Potential to Achieve Goals: Good ADL Goals Pt Will Perform Lower Body Dressing: Independently;sit to/from stand Pt Will Transfer to Toilet: ambulating;regular height toilet;with modified independence  OT Frequency: Min 1X/week    Co-evaluation              AM-PAC OT 6 Clicks Daily Activity     Outcome Measure Help from another person eating meals?: None Help from another person taking care of personal grooming?: None Help from another person toileting, which includes using toliet, bedpan, or urinal?: None Help from another person bathing (including washing, rinsing, drying)?: A Little Help from another person to put on and taking off regular upper body clothing?: None Help from another person to put on and taking off regular lower body clothing?: A Little 6 Click Score: 22   End of Session Equipment Utilized During Treatment: Rolling walker (2 wheels)  Activity Tolerance: Patient tolerated treatment well Patient left: in chair;with call bell/phone within reach;with chair alarm set  OT Visit Diagnosis: Unsteadiness on feet (R26.81)                Time: 8990-8975 OT Time Calculation (min): 15 min Charges:  OT General Charges $OT Visit: 1 Visit OT Evaluation $OT Eval Low Complexity: 1 Low OT Treatments $Self Care/Home Management : 8-22 mins  Joe Dillon, M.S. OTR/L  11/18/23, 11:04 AM  ascom [phone]

## 2023-11-18 NOTE — Progress Notes (Signed)
 [facility] Cardiology  SUBJECTIVE: Patient sitting on side of the bed, denies chest pain or shortness of breath   Vitals:   11/17/23 2031 11/18/23 0034 11/18/23 0429 11/18/23 0859  BP: 115/66 104/61 108/70 (!) 106/59  Pulse: 74 66 68 80  Resp: 18 16 20 18   Temp:  98.2 F (36.8 C) 98.3 F (36.8 C) 98.1 F (36.7 C)  TempSrc:  Oral Oral   SpO2: 96% 93% 91% 95%  Weight:      Height:         Intake/Output Summary (Last 24 hours) at 11/18/2023 1155 Last data filed at 11/18/2023 0859 Gross per 24 hour  Intake 1463.68 ml  Output 550 ml  Net 913.68 ml      PHYSICAL EXAM  General: Well developed, well nourished, in no acute distress HEENT:  Normocephalic and atramatic Neck:  No JVD.  Lungs: Clear bilaterally to auscultation and percussion. Heart: HRRR . Normal S1 and S2 without gallops or murmurs.  Abdomen: Bowel sounds are positive, abdomen soft and non-tender  Msk:  Back normal, normal gait. Normal strength and tone for age. Extremities: No clubbing, cyanosis or edema.   Neuro: Alert and oriented X 3. Psych:  Good affect, responds appropriately   LABS: Basic Metabolic Panel: Recent Labs    11/17/23 0643 11/18/23 0406  NA 135 134*  K 4.1 4.0  CL 103 100  CO2 26 25  GLUCOSE 149* 85  BUN 21 27*  CREATININE 0.96 0.96  CALCIUM  8.4* 8.2*   Liver Function Tests: Recent Labs    11/17/23 0643  AST 21  ALT 15  ALKPHOS 84  BILITOT 0.3  PROT 6.5  ALBUMIN 3.5   No results for input(s): LIPASE, AMYLASE in the last 72 hours. CBC: Recent Labs    11/17/23 0643 11/18/23 0406  WBC 16.7* 8.9  HGB 9.7* 8.4*  HCT 29.9* 25.8*  MCV 87.7 89.6  PLT 218 172   Cardiac Enzymes: No results for input(s): CKTOTAL, CKMB, CKMBINDEX, TROPONINI in the last 72 hours. BNP: Invalid input(s): POCBNP D-Dimer: Recent Labs    11/17/23 0144  DDIMER 1.67*   Hemoglobin A1C: No results for input(s): HGBA1C in the last 72 hours. Fasting Lipid Panel: Recent Labs     11/18/23 0406  CHOL 84  HDL 44  LDLCALC 31  TRIG 45  CHOLHDL 1.9   Thyroid  Function Tests: No results for input(s): TSH, T4TOTAL, T3FREE, THYROIDAB in the last 72 hours.  Invalid input(s): FREET3 Anemia Panel: No results for input(s): VITAMINB12, FOLATE, FERRITIN, TIBC, IRON, RETICCTPCT in the last 72 hours.  CT Angio Chest Pulmonary Embolism (PE) W or WO Contrast Result Date: 11/17/2023 CLINICAL DATA:  Status post left knee replacement surgery, with a positive D-dimer suspected pulmonary embolism. EXAM: CT ANGIOGRAPHY CHEST WITH CONTRAST TECHNIQUE: Multidetector CT imaging of the chest was performed using the standard protocol during bolus administration of intravenous contrast. Multiplanar CT image reconstructions and MIPs were obtained to evaluate the vascular anatomy. RADIATION DOSE REDUCTION: This exam was performed according to the departmental dose-optimization program which includes automated exposure control, adjustment of the mA and/or kV according to patient size and/or use of iterative reconstruction technique. CONTRAST:  OMNIPAQUE  IOHEXOL  350 MG/ML SOLN COMPARISON:  CTA chest 02/27/2023 FINDINGS: Cardiovascular: No arterial embolism is seen. The pulmonary trunk is slightly prominent at 3 cm which suggests a degree of pulmonary hypertension, was previously 2.7 cm. There is mild aortic atherosclerosis. The great vessels are unremarkable. No aneurysm, stenosis or dissection is  seen. Pulmonary veins are nondistended. The cardiac size is normal. There is no pericardial effusion. There are scattered calcific plaques of the LAD and right coronary arteries. Mediastinum/Nodes: Stable few slightly prominent bilateral hilar lymph nodes, scattered borderline sized mediastinal nodes. No new or progressive adenopathy. The lower poles of the thyroid  gland, axillary spaces, thoracic trachea, thoracic esophagus are unremarkable. Lungs/Pleura: The lungs are moderately  emphysematous with centrilobular changes predominating. Diffuse bronchial thickening. Small caliber central airways which could be due to respiration, reactive airways disease or bronchospasm. There are scattered linear scar-like opacities. There is no consolidation, effusion or pneumothorax. Upper Abdomen: No acute abnormality. Musculoskeletal: No chest wall abnormality. No acute or significant osseous findings. Old left clavicular fracture fixation plating is partially visible. Review of the MIP images confirms the above findings. IMPRESSION: 1. No evidence of arterial embolus. 2. Slightly prominent pulmonary trunk at 3 cm suggesting a degree of pulmonary hypertension. 3. Aortic and coronary artery atherosclerosis. 4. Emphysema and bronchitis.  No focal pneumonia. 5. Small caliber central airways. Query whether due to respiration, bronchospasm or reactive airway disease. 6. Stable slightly prominent hilar and borderline sized mediastinal nodes. Aortic Atherosclerosis (ICD10-I70.0) and Emphysema (ICD10-J43.9). Electronically Signed   By: Francis Quam M.D.   On: 11/17/2023 04:16     Echo pending  TELEMETRY: Sinus rhythm:  ASSESSMENT AND PLAN:  Principal Problem:   NSTEMI (non-ST elevated myocardial infarction) (HCC) Active Problems:   Essential hypertension   History of total knee arthroplasty, left   Chronic obstructive pulmonary disease (COPD) (HCC)   GERD without esophagitis   Coronary artery disease    1.  NSTEMI (29, 120, 9868, 9594, 9934), postoperatively, currently without chest pain, on heparin  drip 2.  Known CAD, 70% stenosis proximal RCA, 50% stenosis distal left main 03/29/2023 3.  Status post left TKA 11/16/2023, followed by [facility] orthopedics 4.  COPD  Recommendations  1.  Agree with current therapy 2.  Continue heparin  drip 3.  Proceed with cardiac catheterization with selective coronary arteriography, scheduled for 11/19/2023.  The risk, benefits and alternatives of cardiac  catheterization and possible PCI were explained to the patient and informed written consent was obtained.   Marsa Dooms, MD, PhD, FACC 11/18/2023 11:55 AM
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
## 2023-11-19 ENCOUNTER — Encounter
Admission: RE | Disposition: A | Discharge status: Home / Self Care | Payer: Self-pay | Source: Home / Self Care | Attending: Orthopedic Surgery

## 2023-11-19 ENCOUNTER — Other Ambulatory Visit (HOSPITAL_COMMUNITY): Payer: Self-pay

## 2023-11-19 ENCOUNTER — Telehealth (HOSPITAL_COMMUNITY): Payer: Self-pay | Admitting: Pharmacy Technician

## 2023-11-19 DIAGNOSIS — Z955 Presence of coronary angioplasty implant and graft: Secondary | ICD-10-CM

## 2023-11-19 DIAGNOSIS — I214 Non-ST elevation (NSTEMI) myocardial infarction: Secondary | ICD-10-CM | POA: Diagnosis not present

## 2023-11-19 HISTORY — PX: CORONARY STENT INTERVENTION: CATH118234

## 2023-11-19 HISTORY — DX: Non-ST elevation (NSTEMI) myocardial infarction: I21.4

## 2023-11-19 HISTORY — PX: LEFT HEART CATH AND CORONARY ANGIOGRAPHY: CATH118249

## 2023-11-19 LAB — BASIC METABOLIC PANEL
Anion gap: 10 (ref 5–15)
BUN: 22 mg/dL (ref 8–23)
CO2: 27 mmol/L (ref 22–32)
Calcium: 7.9 mg/dL — ABNORMAL LOW (ref 8.9–10.3)
Chloride: 97 mmol/L — ABNORMAL LOW (ref 98–111)
Creatinine, Ser: 1.04 mg/dL (ref 0.61–1.24)
GFR, Estimated: >60 mL/min (ref >60)
Glucose, Bld: 87 mg/dL (ref 70–99)
Potassium: 3.8 mmol/L (ref 3.5–5.1)
Sodium: 134 mmol/L — ABNORMAL LOW (ref 135–145)

## 2023-11-19 LAB — ECHOCARDIOGRAM COMPLETE
AR max vel: 3.15 cm2
AV Area VTI: 3.01 cm2
AV Area mean vel: 2.97 cm2
AV Mean grad: 5 mm[Hg]
AV Peak grad: 8.4 mm[Hg]
Ao pk vel: 1.45 m/s
Area-P 1/2: 3.02 cm2
Calc EF: 51.2 %
Height: 70 in
MV VTI: 3.07 cm2
S' Lateral: 4.1 cm
Single Plane A2C EF: 45.1 %
Single Plane A4C EF: 58.9 %
Weight: 3120 [oz_av]

## 2023-11-19 LAB — CBC
HCT: 25.9 % — ABNORMAL LOW (ref 39.0–52.0)
Hemoglobin: 8.4 g/dL — ABNORMAL LOW (ref 13.0–17.0)
MCH: 28.9 pg (ref 26.0–34.0)
MCHC: 32.4 g/dL (ref 30.0–36.0)
MCV: 89 fL (ref 80.0–100.0)
Platelets: 170 10*3/uL (ref 150–400)
RBC: 2.91 MIL/uL — ABNORMAL LOW (ref 4.22–5.81)
RDW: 13.1 % (ref 11.5–15.5)
WBC: 9.2 10*3/uL (ref 4.0–10.5)
nRBC: 0.2 % (ref 0.0–0.2)

## 2023-11-19 LAB — FOLATE: Folate: 13.7 ng/mL (ref >5.9)

## 2023-11-19 LAB — IRON AND TIBC
Iron: 24 ug/dL — ABNORMAL LOW (ref 45–182)
Saturation Ratios: 8 % — ABNORMAL LOW (ref 17.9–39.5)
TIBC: 298 ug/dL (ref 250–450)
UIBC: 274 ug/dL

## 2023-11-19 LAB — RETICULOCYTES
Immature Retic Fract: 24.9 % — ABNORMAL HIGH (ref 2.3–15.9)
RBC.: 2.92 MIL/uL — ABNORMAL LOW (ref 4.22–5.81)
Retic Count, Absolute: 48.8 10*3/uL (ref 19.0–186.0)
Retic Ct Pct: 1.7 % (ref 0.4–3.1)

## 2023-11-19 LAB — FERRITIN: Ferritin: 41 ng/mL (ref 24–336)

## 2023-11-19 LAB — VITAMIN B12: Vitamin B-12: 263 pg/mL (ref 180–914)

## 2023-11-19 LAB — POCT ACTIVATED CLOTTING TIME
Activated Clotting Time: 250 s
Activated Clotting Time: 268 s

## 2023-11-19 LAB — HEPARIN LEVEL (UNFRACTIONATED): Heparin Unfractionated: 0.34 [IU]/mL (ref 0.30–0.70)

## 2023-11-19 SURGERY — LEFT HEART CATH AND CORONARY ANGIOGRAPHY
Anesthesia: Moderate Sedation

## 2023-11-19 MED ORDER — MIDAZOLAM HCL 2 MG/2ML IJ SOLN
INTRAMUSCULAR | Status: DC | PRN
  Administered 2023-11-19: 1 mg via INTRAVENOUS

## 2023-11-19 MED ORDER — SODIUM CHLORIDE 0.9 % WEIGHT BASED INFUSION
1.0000 mL/kg/h | INTRAVENOUS | Status: DC
  Administered 2023-11-19: 1 mL/kg/h via INTRAVENOUS

## 2023-11-19 MED ORDER — ASPIRIN 81 MG PO CHEW
CHEWABLE_TABLET | ORAL | Status: DC | PRN
  Administered 2023-11-19: 243 mg via ORAL

## 2023-11-19 MED ORDER — TICAGRELOR 90 MG PO TABS
ORAL_TABLET | ORAL | Status: AC
  Filled 2023-11-19: qty 2

## 2023-11-19 MED ORDER — TICAGRELOR 90 MG PO TABS
ORAL_TABLET | ORAL | Status: DC | PRN
  Administered 2023-11-19: 180 mg via ORAL

## 2023-11-19 MED ORDER — FENTANYL CITRATE (PF) 100 MCG/2ML IJ SOLN
INTRAMUSCULAR | Status: AC
  Filled 2023-11-19: qty 2

## 2023-11-19 MED ORDER — ASPIRIN 81 MG PO CHEW
CHEWABLE_TABLET | ORAL | Status: AC
Start: 2023-11-19 — End: ?
  Filled 2023-11-19: qty 3

## 2023-11-19 MED ORDER — LIDOCAINE HCL 1 % IJ SOLN
INTRAMUSCULAR | Status: AC
  Filled 2023-11-19: qty 20

## 2023-11-19 MED ORDER — IOHEXOL 300 MG/ML  SOLN
INTRAMUSCULAR | Status: DC | PRN
  Administered 2023-11-19: 155 mL

## 2023-11-19 MED ORDER — HEPARIN SODIUM (PORCINE) 1000 UNIT/ML IJ SOLN
INTRAMUSCULAR | Status: DC | PRN
  Administered 2023-11-19: 3000 [IU] via INTRAVENOUS
  Administered 2023-11-19: 8000 [IU] via INTRAVENOUS
  Administered 2023-11-19: 4000 [IU] via INTRAVENOUS

## 2023-11-19 MED ORDER — HEPARIN (PORCINE) IN NACL 2000-0.9 UNIT/L-% IV SOLN
INTRAVENOUS | Status: DC | PRN
  Administered 2023-11-19: 1000 mL

## 2023-11-19 MED ORDER — CLOPIDOGREL BISULFATE 75 MG PO TABS
75.0000 mg | ORAL_TABLET | ORAL | Status: AC
  Administered 2023-11-19: 75 mg via ORAL
  Filled 2023-11-19: qty 1

## 2023-11-19 MED ORDER — SODIUM CHLORIDE 0.9 % WEIGHT BASED INFUSION: 1.0000 mL/kg/h | INTRAVENOUS | Status: AC

## 2023-11-19 MED ORDER — MIDAZOLAM HCL 2 MG/2ML IJ SOLN
INTRAMUSCULAR | Status: AC
  Filled 2023-11-19: qty 2

## 2023-11-19 MED ORDER — HEPARIN (PORCINE) IN NACL 1000-0.9 UT/500ML-% IV SOLN
INTRAVENOUS | Status: AC
  Filled 2023-11-19: qty 1000

## 2023-11-19 MED ORDER — VERAPAMIL HCL 2.5 MG/ML IV SOLN
INTRAVENOUS | Status: AC
  Filled 2023-11-19: qty 2

## 2023-11-19 MED ORDER — SODIUM CHLORIDE 0.9 % WEIGHT BASED INFUSION
3.0000 mL/kg/h | INTRAVENOUS | Status: AC
Start: 2023-11-19 — End: 2023-11-19
  Administered 2023-11-19: 3 mL/kg/h via INTRAVENOUS

## 2023-11-19 MED ORDER — VERAPAMIL HCL 2.5 MG/ML IV SOLN
INTRAVENOUS | Status: DC | PRN
  Administered 2023-11-19: 5 mg via INTRAVENOUS

## 2023-11-19 MED ORDER — LIDOCAINE HCL (PF) 1 % IJ SOLN
INTRAMUSCULAR | Status: DC | PRN
  Administered 2023-11-19: 5 mL

## 2023-11-19 MED ORDER — HEPARIN SODIUM (PORCINE) 1000 UNIT/ML IJ SOLN
INTRAMUSCULAR | Status: AC
  Filled 2023-11-19: qty 10

## 2023-11-19 MED ORDER — ASPIRIN 81 MG PO CHEW
81.0000 mg | CHEWABLE_TABLET | ORAL | Status: AC
  Administered 2023-11-19: 81 mg via ORAL
  Filled 2023-11-19: qty 1

## 2023-11-19 MED ORDER — FENTANYL CITRATE (PF) 100 MCG/2ML IJ SOLN
INTRAMUSCULAR | Status: DC | PRN
  Administered 2023-11-19: 25 ug via INTRAVENOUS

## 2023-11-19 MED ORDER — TICAGRELOR 90 MG PO TABS
90.0000 mg | ORAL_TABLET | Freq: Two times a day (BID) | ORAL | Status: DC
  Administered 2023-11-19 – 2023-11-20 (×2): 90 mg via ORAL
  Filled 2023-11-19 (×2): qty 1

## 2023-11-19 SURGICAL SUPPLY — 19 items
BALLN EUPHORA RX 2.5X15 (BALLOONS) ×1
BALLN ~~LOC~~ TREK NEO RX 3.25X8 (BALLOONS) ×1
BALLOON EUPHORA RX 2.5X15 (BALLOONS) IMPLANT
BALLOON ~~LOC~~ TREK NEO RX 3.25X8 (BALLOONS) IMPLANT
CATH 5FR JL3.5 JR4 ANG PIG MP (CATHETERS) IMPLANT
CATH VISTA GUIDE 6FR MPA1 (CATHETERS) IMPLANT
DEVICE RAD TR BAND REGULAR (VASCULAR PRODUCTS) IMPLANT
DRAPE BRACHIAL (DRAPES) IMPLANT
GLIDESHEATH SLEND SS 6F .021 (SHEATH) IMPLANT
GUIDEWIRE INQWIRE 1.5J.035X260 (WIRE) IMPLANT
INQWIRE 1.5J .035X260CM (WIRE) ×1
KIT ENCORE 26 ADVANTAGE (KITS) IMPLANT
PACK CARDIAC CATH (CUSTOM PROCEDURE TRAY) ×1 IMPLANT
PROTECTION STATION PRESSURIZED (MISCELLANEOUS) ×1
SET ATX-X65L (MISCELLANEOUS) IMPLANT
STATION PROTECTION PRESSURIZED (MISCELLANEOUS) IMPLANT
STENT ONYX FRONTIER 2.75X15 (Permanent Stent) IMPLANT
TUBING CIL FLEX 10 FLL-RA (TUBING) IMPLANT
WIRE G HI TQ BMW 190 (WIRE) IMPLANT

## 2023-11-19 NOTE — Progress Notes (Signed)
 PT Cancellation Note  Patient Details Name: Joe Dillon MRN: [id number] DOB: Sep 01, 1956   Cancelled Treatment:    Reason Eval/Treat Not Completed: Patient at procedure or test/unavailable (Patient off the floor for cardiac cath procedure. PT will follow up as appropriate pending medical readiness for activity.)  Randine Essex, PT, MPT  Randine LULLA Essex 11/19/2023, 12:40 PM

## 2023-11-19 NOTE — Plan of Care (Signed)

## 2023-11-19 NOTE — Plan of Care (Signed)
  Problem: Clinical Measurements: Goal: Ability to maintain clinical measurements within normal limits will improve Outcome: Progressing Goal: Will remain free from infection Outcome: Progressing Goal: Respiratory complications will improve Outcome: Progressing Goal: Cardiovascular complication will be avoided Outcome: Progressing   Problem: Nutrition: Goal: Adequate nutrition will be maintained Outcome: Progressing   Problem: Pain Management: Goal: General experience of comfort will improve Outcome: Progressing   Problem: Education: Goal: Knowledge of the prescribed therapeutic regimen will improve Outcome: Progressing   Problem: Activity: Goal: Risk for activity intolerance will decrease Outcome: Not Progressing

## 2023-11-19 NOTE — Progress Notes (Signed)
 ORTHOPAEDICS PROGRESS NOTE  PATIENT NAME: Joe Dillon DOB: [birth year]  MRN: [id number]  POD # 3: Left total knee arthroplasty   Subjective: Patient denies any SOB or chest pain. Knee pain under good control. Ambulated to the bathroom yesterday (PT limited as per Cardiology recommendations)  Objective: Vital signs in last 24 hours: Temp:  [97.9 F (36.6 C)-99.2 F (37.3 C)] 98.3 F (36.8 C) (01/13 0401) Pulse Rate:  [68-80] 71 (01/13 0401) Resp:  [14-18] 16 (01/13 0401) BP: (97-114)/(59-71) 114/71 (01/13 0401) SpO2:  [88 %-95 %] 95 % (01/13 0401) Weight:  [84 kg] 84 kg (01/13 0017)  Intake/Output from previous day: 01/12 0701 - 01/13 0700 In: 318.6 [I.V.:286.7; IV Piggyback:32] Out: 1000 [Urine:1000]  Recent Labs    11/17/23 0144 11/17/23 0643 11/18/23 0406 11/19/23 0615  WBC  --  16.7* 8.9 9.2  HGB  --  9.7* 8.4* 8.4*  HCT  --  29.9* 25.8* 25.9*  PLT  --  218 172 170  K  --  4.1 4.0  --   CL  --  103 100  --   CO2  --  26 25  --   BUN  --  21 27*  --   CREATININE  --  0.96 0.96  --   GLUCOSE  --  149* 85  --   CALCIUM   --  8.4* 8.2*  --   INR 1.1  --   --   --     EXAM General: Well developed, well nourished male in no discomfort Left lower extremity: TED hose and [employer] in place. Able to perform SLR. Hamstrings tight. Neurologic: Awake, alert, and oriented. Sensory and motor function are grossly intact.   Assessment: Left total knee arthroplasty  NSTEMI  Plan: OT notes reviewed. (PT not performed yesterday.) Appreciate Medicine and Cardiology assistance. Patient is scheduled for cardiac cath later today. Will await Cardiology's clearance for advancing physical therapy Plan is to go Home after hospital stay.  Joe Dillon, [name] M.D.

## 2023-11-19 NOTE — Progress Notes (Signed)
 [facility] CLINIC CARDIOLOGY PROGRESS NOTE   Patient ID: Joe Dillon MRN: [id number] DOB/AGE: 69/09/1956 69 y.o.  Admit date: 11/16/2023 Referring Physician Dr. Madison Quill  Primary Physician Sparks, Reyes [name], MD  Primary Cardiologist Dr. Florencio Reason for Consultation NSTEMI  HPI: Joe Dillon is a 68 y.o. male with a past medical history of coronary artery disease, hypertension, hyperlipidemia, COPD who underwent TKA on 11/16/2023 and on 1/11 developed chest tightness. Troponins found to be elevated. Cardiology was consulted for further evaluation.   Interval History:  -Patient seen and examined this AM, reports he is feeling ok overall.  -Denies any recurrence of chest pain. Also denies any SOB.  -BP and HR remain stable.  -Plan for LHC later today.   Review of systems complete and found to be negative unless listed above    Vitals:   11/19/23 0037 11/19/23 0401 11/19/23 0730 11/19/23 0735  BP:  114/71  120/66  Pulse:  71  78  Resp: 14 16 10 16   Temp:  98.3 F (36.8 C)  98.4 F (36.9 C)  TempSrc:  Oral  Oral  SpO2:  95%  90%  Weight:      Height:         Intake/Output Summary (Last 24 hours) at 11/19/2023 1006 Last data filed at 11/19/2023 0759 Gross per 24 hour  Intake 802.99 ml  Output 575 ml  Net 227.99 ml     PHYSICAL EXAM General: Well appearing male, well nourished, in no acute distress. HEENT: Normocephalic and atraumatic. Neck: No JVD.  Lungs: Normal respiratory effort on room air. Clear bilaterally to auscultation. No wheezes, crackles, rhonchi.  Heart: HRRR. Normal S1 and S2 without gallops or murmurs. Radial & DP pulses 2+ bilaterally. Abdomen: Non-distended appearing.  Msk: Normal strength and tone for age. Extremities: No clubbing, cyanosis or edema.   Neuro: Alert and oriented X 3. Psych: Mood appropriate, affect congruent.    LABS: Basic Metabolic Panel: Recent Labs    11/18/23 0406 11/19/23 0615  NA 134* 134*  K 4.0 3.8  CL 100  97*  CO2 25 27  GLUCOSE 85 87  BUN 27* 22  CREATININE 0.96 1.04  CALCIUM  8.2* 7.9*   Liver Function Tests: Recent Labs    11/17/23 0643  AST 21  ALT 15  ALKPHOS 84  BILITOT 0.3  PROT 6.5  ALBUMIN 3.5   No results for input(s): LIPASE, AMYLASE in the last 72 hours. CBC: Recent Labs    11/18/23 0406 11/19/23 0615  WBC 8.9 9.2  HGB 8.4* 8.4*  HCT 25.8* 25.9*  MCV 89.6 89.0  PLT 172 170   Cardiac Enzymes: Recent Labs    11/17/23 2028 11/17/23 2144 11/18/23 0406  TROPONINIHS 9,868* 9,594* 9,934*   BNP: No results for input(s): BNP in the last 72 hours. D-Dimer: Recent Labs    11/17/23 0144  DDIMER 1.67*   Hemoglobin A1C: No results for input(s): HGBA1C in the last 72 hours. Fasting Lipid Panel: Recent Labs    11/18/23 0406  CHOL 84  HDL 44  LDLCALC 31  TRIG 45  CHOLHDL 1.9   Thyroid  Function Tests: No results for input(s): TSH, T4TOTAL, T3FREE, THYROIDAB in the last 72 hours.  Invalid input(s): FREET3 Anemia Panel: Recent Labs    11/19/23 0615  FOLATE 13.7  FERRITIN 41  TIBC 298  IRON 24*  RETICCTPCT 1.7    No results found.   ECHO pending  TELEMETRY reviewed by me 11/19/23: sinus rhythm rate 80s  EKG reviewed by me 11/19/23: NSR rate 87 bpm, nonacute  DATA reviewed by me 11/19/23: last 24h vitals tele labs imaging I/O, hospitalist progress note  Principal Problem:   NSTEMI (non-ST elevated myocardial infarction) (HCC) Active Problems:   Essential hypertension   History of total knee arthroplasty, left   Chronic obstructive pulmonary disease (COPD) (HCC)   GERD without esophagitis   Coronary artery disease    ASSESSMENT AND PLAN: Joe Dillon is a 68 y.o. male with a past medical history of coronary artery disease, hypertension, hyperlipidemia, COPD who underwent TKA on 11/16/2023 and on 1/11 developed chest tightness. Troponins found to be elevated. Cardiology was consulted for further evaluation.   #  NSTEMI # Coronary artery disease # Hypertension Patient with known hx of CAD, underwent LHC 03/2023 without any significant lesions requiring intervention. Underwent TKA on 1/10 and one day later developed chest tightness. Troponins 29 > 120 > 9868 > 9594 > 9934.  -Continue heparin  infusion, likely DC after LHC today.  -Continue aspirin  81 mg daily and plavix  75 mg daily.  -Continue amlodipine  10 mg daily, losartan  100 mg daily, hydrochlorothiazide  12.5 mg daily, metoprolol  tartrate 50 mg twice daily.  -Continue rosuvastatin  40 mg daily.  -Discussed the risks and benefits of proceeding with LHC for further evaluation with the patient.  He is agreeable to proceed.  NPO until LHC this afternoon (11/19/2023) with Dr. Florencio.  Written consent will be obtained.  Further recommendations following LHC.    This patient's case was discussed and created with Dr. Wilburn and he is in agreement.  Signed:  Danita Bloch, PA-C  11/19/2023, 10:06 AM [facility]

## 2023-11-19 NOTE — Progress Notes (Signed)
 Observed NT shaved left/right groin and  right radial. No issues noted.

## 2023-11-19 NOTE — Progress Notes (Signed)
 Progress Note   Patient: Joe Dillon FMW:[id number] DOB: [birth year] DOA: 11/16/2023     2 DOS: the patient was seen and examined on 11/19/2023   Brief hospital course: 68 y.o. Caucasian male with medical history significant for osteoarthritis status post bilateral TKA with left TKA done during this hospital stay, COPD, coronary artery disease, diastolic dysfunction, GERD, hypertension and dyslipidemia.  Patient was doing well postoperatively when he started developing chest tightness when he was on the orthopedic floor.  Troponin levels were noted to be abnormal.  Hospitalist was consulted.  Subsequently cardiology was consulted.    Consultants: Cardiology   Assessment/Plan:   NSTEMI CT angiogram was negative for PE. Troponin levels rose significantly overnight.  Patient remains on heparin  and aspirin .  Cardiology is following and managing.  Noted to be on beta-blockers and statin as well. LDL is 31. Cardiologist on board and case discussed Continue heparin  drip Patient underwent left heart catheterization on 11/19/2023 with stent to RCA Patient plans to be on dual antiplatelet therapy with aspirin  and Brilinta    Status post left total knee arthroplasty Management per orthopedics.   Normocytic anemia Drop in hemoglobin is combination of dilution as well as surgery.  No overt blood loss noted.  He does have Hemovac in his left knee.   Monitor CBC closely especially while on heparin   Essential hypertension Monitor blood pressures closely.   History of COPD Continue as needed nebulization     DVT Prophylaxis: Currently on IV heparin  Code Status: Full code Family Communication: Discussed with patient Disposition Plan: Anticipate discharge home when improved   Subjective:  Patient seen and examined at bedside this morning Chest pain improved Denied nausea vomiting abdominal pain cough  Physical Exam: General appearance: Awake alert.  In no distress Resp: Clear to  auscultation bilaterally.  Normal effort Cardio: S1-S2 is normal regular.  No S3-S4.  No rubs murmurs or bruit GI: Abdomen is soft.  Nontender nondistended.  Bowel sounds are present normal.  No masses organomegaly Extremities: No edema.  Full range of motion of lower extremities. Neurologic: Alert and oriented x3.  No focal neurological deficits.    Vitals:   11/19/23 1445 11/19/23 1502 11/19/23 1515 11/19/23 1530  BP: 122/77 119/73 136/78 (!) 132/52  Pulse: 78 78 76 85  Resp: 20 17 15 20   Temp:      TempSrc:      SpO2: 95% 96% 96% 94%  Weight:      Height:        Data Reviewed:    Latest Ref Rng & Units 11/19/2023    6:15 AM 11/18/2023    4:06 AM 11/17/2023    6:43 AM  BMP  Glucose 70 - 99 mg/dL 87  85  850   BUN 8 - 23 mg/dL 22  27  21    Creatinine 0.61 - 1.24 mg/dL 8.95  9.03  9.03   Sodium 135 - 145 mmol/L 134  134  135   Potassium 3.5 - 5.1 mmol/L 3.8  4.0  4.1   Chloride 98 - 111 mmol/L 97  100  103   CO2 22 - 32 mmol/L 27  25  26    Calcium  8.9 - 10.3 mg/dL 7.9  8.2  8.4        Latest Ref Rng & Units 11/19/2023    6:15 AM 11/18/2023    4:06 AM 11/17/2023    6:43 AM  CBC  WBC 4.0 - 10.5 K/uL 9.2  8.9  16.7  Hemoglobin 13.0 - 17.0 g/dL 8.4  8.4  9.7   Hematocrit 39.0 - 52.0 % 25.9  25.8  29.9   Platelets 150 - 400 K/uL 170  172  218       Author: Drue ONEIDA Potter, MD 11/19/2023 4:16 PM  For on call review [email or web address].
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
## 2023-11-19 NOTE — Progress Notes (Signed)
 PT Cancellation Note  Patient Details Name: Joe Dillon MRN: [id number] DOB: June 24, 1956   Cancelled Treatment:    Reason Eval/Treat Not Completed: Patient at procedure or test/unavailable, will attempt to see pt at a future date/time as medically appropriate.    CHARM Glendia Bertin PT, DPT 11/19/23, 4:11 PM

## 2023-11-19 NOTE — CV Procedure (Signed)
 Brief cath PCI note  Inpatient left heart cath possible PCI and stent because of non-STEMI  Right radial approach  Left ventriculogram showed preserved left ventricular function mild inferior hypo-EF of around 50 to 55%  Coronaries Left main large relatively free of disease LAD large mild to moderate disease mid segment Circumflex large moderate disease RCA very large proximal 90% lesion TIMI-3 flow IRA Right dominant system  Intervention PCI and stent proximal RCA with DES 2.75 x 15 mm frontier Onyx Postdilated with a 3.25 x 8 mm Teller neo to 14 atm  Lesion reduced from 90 down to 0% Patient to be maintained on aspirin  Brilinta  for at least 12 months  Will attempt early discharge since patient stable provided his access site does well  Patient to follow-up with cardiology 1 to 2 weeks  Full cath note to follow [facility]

## 2023-11-19 NOTE — Telephone Encounter (Signed)
 Patient Product/process Development Scientist completed.    The patient is insured through [facility]. Patient has Medicare and is not eligible for a copay card, but may be able to apply for patient assistance or Medicare RX Payment Plan (Patient Must reach out to their plan, if eligible for payment plan), if available.    Ran test claim for Brilinta  90 mg and the current 30 day co-pay is $413.01 due to a deductible.   This test claim was processed through [facility]- copay amounts may vary at other pharmacies due to pharmacy/plan contracts, or as the patient moves through the different stages of their insurance plan.     Reyes Sharps, CPHT Pharmacy Technician III Certified Patient Advocate [facility] Patient Advocate Team Direct Number: [phone]  Fax: [phone]

## 2023-11-20 DIAGNOSIS — I214 Non-ST elevation (NSTEMI) myocardial infarction: Secondary | ICD-10-CM | POA: Diagnosis not present

## 2023-11-20 LAB — BASIC METABOLIC PANEL
Anion gap: 7 (ref 5–15)
BUN: 17 mg/dL (ref 8–23)
CO2: 25 mmol/L (ref 22–32)
Calcium: 8 mg/dL — ABNORMAL LOW (ref 8.9–10.3)
Chloride: 101 mmol/L (ref 98–111)
Creatinine, Ser: 0.95 mg/dL (ref 0.61–1.24)
GFR, Estimated: >60 mL/min (ref >60)
Glucose, Bld: 142 mg/dL — ABNORMAL HIGH (ref 70–99)
Potassium: 3.8 mmol/L (ref 3.5–5.1)
Sodium: 133 mmol/L — ABNORMAL LOW (ref 135–145)

## 2023-11-20 LAB — CBC
HCT: 24.8 % — ABNORMAL LOW (ref 39.0–52.0)
Hemoglobin: 8.2 g/dL — ABNORMAL LOW (ref 13.0–17.0)
MCH: 29 pg (ref 26.0–34.0)
MCHC: 33.1 g/dL (ref 30.0–36.0)
MCV: 87.6 fL (ref 80.0–100.0)
Platelets: 177 10*3/uL (ref 150–400)
RBC: 2.83 MIL/uL — ABNORMAL LOW (ref 4.22–5.81)
RDW: 13 % (ref 11.5–15.5)
WBC: 10.2 10*3/uL (ref 4.0–10.5)
nRBC: 0 % (ref 0.0–0.2)

## 2023-11-20 MED ORDER — METOPROLOL TARTRATE 50 MG PO TABS: 50.0000 mg | ORAL_TABLET | Freq: Two times a day (BID) | ORAL | 1 refills | Status: DC

## 2023-11-20 MED ORDER — TICAGRELOR 90 MG PO TABS: 90.0000 mg | ORAL_TABLET | Freq: Two times a day (BID) | ORAL | 3 refills | Status: DC

## 2023-11-20 MED ORDER — FERROUS SULFATE 325 (65 FE) MG PO TABS: 325.0000 mg | ORAL_TABLET | Freq: Two times a day (BID) | ORAL | 3 refills | Status: DC

## 2023-11-20 NOTE — Plan of Care (Signed)

## 2023-11-20 NOTE — Care Management Important Message (Signed)
 Important Message  Patient Details  Name: Joe Dillon MRN: [id number] Date of Birth: 20-Dec-1955   Important Message Given:  Yes - Medicare IM     Sherilyn Banker 11/20/2023, 11:32 AM
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
## 2023-11-20 NOTE — Discharge Summary (Signed)
 Physician Discharge Summary   Patient: Joe Dillon MRN: [id number] DOB: 1956/04/20  Admit date:     11/16/2023  Discharge date: 11/20/23  Discharge Physician: Joe Dillon   PCP: Joe Reyes [name], MD   Recommendations at discharge:  Follow-up with orthopedics and cardiology  Discharge Diagnoses: NSTEMI Status post left total knee arthroplasty Normocytic anemia Essential hypertension History of COPD    Hospital Course: 68 y.o. Caucasian male with medical history significant for osteoarthritis status post bilateral TKA with left TKA done during this hospital stay, COPD, coronary artery disease, diastolic dysfunction, GERD, hypertension and dyslipidemia.  Patient was doing well postoperatively when he started developing chest tightness when he was on the orthopedic floor.  Troponin levels were noted to be abnormal.  Hospitalist was consulted.  Subsequently cardiology was consulted.  Patient underwent knee arthroplasty by orthopedics. Patient underwent left heart catheterization on 11/19/2023 with stent to RCA. Patient plans to be on dual antiplatelet therapy with aspirin  and Brilinta    Consultants: As mentioned above Procedures performed: Cardiac catheterization and knee surgery Disposition: Home Diet recommendation:  Cardiac diet DISCHARGE MEDICATION: Allergies as of 11/20/2023   No Known Allergies      Medication List     TAKE these medications    acetaminophen  650 MG CR tablet Commonly known as: TYLENOL  Take 1,300 mg by mouth every 8 (eight) hours as needed for pain.   albuterol  108 (90 Base) MCG/ACT inhaler Commonly known as: VENTOLIN  HFA Inhale 2 puffs into the lungs every 6 (six) hours as needed for wheezing or shortness of breath.   albuterol  (2.5 MG/3ML) 0.083% nebulizer solution Commonly known as: PROVENTIL  Inhale 3 mLs (2.5 mg total) into the lungs every 6 (six) hours as needed for wheezing or shortness of breath.   amLODipine  10 MG tablet Commonly  known as: NORVASC  Take 1 tablet (10 mg total) by mouth daily.   aspirin  EC 81 MG tablet Take 1 tablet (81 mg total) by mouth in the morning and at bedtime. Swallow whole. What changed: when to take this   esomeprazole 20 MG capsule Commonly known as: NEXIUM Take 20 mg by mouth every morning.   ferrous sulfate  325 (65 FE) MG tablet Take 1 tablet (325 mg total) by mouth 2 (two) times daily with a meal.   isosorbide  mononitrate 30 MG 24 hr tablet Commonly known as: IMDUR  Take 30 mg by mouth daily.   losartan -hydrochlorothiazide  100-12.5 MG tablet Commonly known as: HYZAAR Take 1 tablet by mouth every morning.   meloxicam  15 MG tablet Commonly known as: MOBIC  Take 1 tablet (15 mg total) by mouth every morning.   metoprolol  tartrate 50 MG tablet Commonly known as: LOPRESSOR  Take 1 tablet (50 mg total) by mouth 2 (two) times daily.   oxyCODONE  5 MG immediate release tablet Commonly known as: Roxicodone  Take 1 tablet (5 mg total) by mouth every 4 (four) hours as needed for severe pain (pain score 7-10).   rosuvastatin  40 MG tablet Commonly known as: CRESTOR  Take 40 mg by mouth every morning.   ticagrelor  90 MG Tabs tablet Commonly known as: BRILINTA  Take 1 tablet (90 mg total) by mouth 2 (two) times daily.   traMADol  50 MG tablet Commonly known as: Ultram  Take 1-2 tablets (50-100 mg total) by mouth every 6 (six) hours as needed for moderate pain (pain score 4-6).   Trelegy Ellipta  100-62.5-25 MCG/ACT Aepb Generic drug: Fluticasone -Umeclidin-Vilant INHALE ONE PUFF BY MOUTH DAILY   vitamin C  1000 MG tablet Take 1,000 mg  by mouth daily.               Durable Medical Equipment  (From admission, onward)           Start     Ordered   11/16/23 1053  DME Walker rolling  Once       Question:  Patient needs a walker to treat with the following condition  Answer:  Total knee replacement status   11/16/23 1052   11/16/23 1053  DME Bedside commode  Once        Comments: Patient is not able to walk the distance required to go the bathroom, or he/she is unable to safely negotiate stairs required to access the bathroom.  A 3in1 BSC will alleviate this problem  Question:  Patient needs a bedside commode to treat with the following condition  Answer:  Total knee replacement status   11/16/23 1052            Follow-up Information     Joe Chew, PA-C Follow up on 11/29/2023.   Specialty: Orthopedic Surgery Why: at 2:15pm Contact information: [address] [place] [address] [phone]         Joe Lynwood SQUIBB, MD Follow up on 01/01/2024.   Specialty: Orthopedic Surgery Why: at 2:30pm Contact information: [address] [facility] [address] [address] [facility] [place] [address] [phone]         Joe Cara BIRCH, MD. Go on 11/28/2023.   Specialties: Cardiology, Internal Medicine Why: appointment at 1115pm Contact information: [address] [place] [address] [phone]                Discharge Exam: Joe Dillon   11/16/23 9380 11/19/23 0017  Weight: 88.5 kg 84 kg   General appearance: Awake alert.  In no distress Resp: Clear to auscultation bilaterally.  Normal effort Cardio: S1-S2 is normal regular.  No S3-S4.  No rubs murmurs or bruit GI: Abdomen is soft.  Nontender nondistended.  Bowel sounds are present normal.  No masses organomegaly Extremities: No edema.  Full range of motion of lower extremities. Neurologic: Alert and oriented x3.  No focal neurological deficits.     Condition at discharge: good  The results of significant diagnostics from this hospitalization (including imaging, microbiology, ancillary and laboratory) are listed below for reference.   Imaging Studies: ECHOCARDIOGRAM COMPLETE Result Date: 11/19/2023    ECHOCARDIOGRAM REPORT   Patient Name:   Joe Dillon Date of Exam: 11/18/2023 Medical Rec #:  [id number]        Height:       70.0 in Accession #:     [id number]       Weight:       195.0 lb Date of Birth:  August 21, 1956        BSA:          2.065 m Patient Age:    67 years         BP:           101/61 mmHg Patient Gender: M                HR:           75 bpm. Exam Location:  [facility] Procedure: 2D Echo, Cardiac Doppler, Color Doppler and Strain Analysis Indications:     Acute MI  History:         Patient has no prior history of Echocardiogram examinations.  Acute MI and CAD, Abnormal ECG, COPD, Signs/Symptoms:Chest Pain                  and Fatigue; Risk Factors:Hypertension and Former Smoker.  Sonographer:     Naomie Reef Referring Phys:  [id number] DWAYNE D CALLWOOD Diagnosing Phys: Keller Paterson  Sonographer Comments: Technically difficult study due to poor echo windows. Image acquisition challenging due to COPD. Global longitudinal strain was attempted. IMPRESSIONS  1. Left ventricular ejection fraction, by estimation, is 45 to 50%. The left ventricle has mildly decreased function. The left ventricle demonstrates regional wall motion abnormalities (see scoring diagram/findings for description). Left ventricular diastolic parameters are consistent with Grade I diastolic dysfunction (impaired relaxation).  2. Right ventricular systolic function is normal. The right ventricular size is normal.  3. The mitral valve is normal in structure. Trivial mitral valve regurgitation.  4. The aortic valve was not well visualized. There is mild thickening of the aortic valve. Aortic valve regurgitation is not visualized. No aortic stenosis is present.  5. The inferior vena cava is normal in size with greater than 50% respiratory variability, suggesting right atrial pressure of 3 mmHg. FINDINGS  Left Ventricle: Left ventricular ejection fraction, by estimation, is 45 to 50%. The left ventricle has mildly decreased function. The left ventricle demonstrates regional wall motion abnormalities. The left ventricular internal cavity size was normal in size. There is no  left ventricular hypertrophy. Left ventricular diastolic parameters are consistent with Grade I diastolic dysfunction (impaired relaxation).  LV Wall Scoring: The entire inferior wall, mid inferoseptal segment, and basal inferoseptal segment are hypokinetic. The entire anterior wall, entire lateral wall, entire anterior septum, and apex are normal. Right Ventricle: The right ventricular size is normal. No increase in right ventricular wall thickness. Right ventricular systolic function is normal. Left Atrium: Left atrial size was normal in size. Right Atrium: Right atrial size was normal in size. Pericardium: There is no evidence of pericardial effusion. Mitral Valve: The mitral valve is normal in structure. Trivial mitral valve regurgitation. MV peak gradient, 5.9 mmHg. The mean mitral valve gradient is 2.0 mmHg. Tricuspid Valve: The tricuspid valve is normal in structure. Tricuspid valve regurgitation is not demonstrated. Aortic Valve: The aortic valve was not well visualized. There is mild thickening of the aortic valve. Aortic valve regurgitation is not visualized. No aortic stenosis is present. Aortic valve mean gradient measures 5.0 mmHg. Aortic valve peak gradient measures 8.4 mmHg. Aortic valve area, by VTI measures 3.01 cm. Pulmonic Valve: The pulmonic valve was not well visualized. Pulmonic valve regurgitation is trivial. Aorta: The aortic root is normal in size and structure. Venous: The inferior vena cava is normal in size with greater than 50% respiratory variability, suggesting right atrial pressure of 3 mmHg. IAS/Shunts: The atrial septum is grossly normal.  LEFT VENTRICLE PLAX 2D LVIDd:         5.60 cm      Diastology LVIDs:         4.10 cm      LV e' medial:    9.25 cm/s LV PW:         1.00 cm      LV E/e' medial:  6.9 LV IVS:        1.00 cm      LV e' lateral:   10.10 cm/s LVOT diam:     2.10 cm      LV E/e' lateral: 6.3 LV SV:         95 LV SV  Index:   46 LVOT Area:     3.46 cm  LV Volumes (MOD)  LV vol d, MOD A2C: 117.0 ml LV vol d, MOD A4C: 85.5 ml LV vol s, MOD A2C: 64.2 ml LV vol s, MOD A4C: 35.1 ml LV SV MOD A2C:     52.8 ml LV SV MOD A4C:     85.5 ml LV SV MOD BP:      52.7 ml RIGHT VENTRICLE RV Basal diam:  3.65 cm RV Mid diam:    3.30 cm RV S prime:     15.90 cm/s TAPSE (M-mode): 2.8 cm LEFT ATRIUM             Index        RIGHT ATRIUM           Index LA diam:        3.80 cm 1.84 cm/m   RA Area:     18.10 cm LA Vol (A2C):   52.6 ml 25.47 ml/m  RA Volume:   56.10 ml  27.17 ml/m LA Vol (A4C):   54.2 ml 26.25 ml/m LA Biplane Vol: 56.8 ml 27.51 ml/m  AORTIC VALVE                     PULMONIC VALVE AV Area (Vmax):    3.15 cm      PV Vmax:       1.10 m/s AV Area (Vmean):   2.97 cm      PV Peak grad:  4.8 mmHg AV Area (VTI):     3.01 cm AV Vmax:           145.00 cm/s AV Vmean:          101.000 cm/s AV VTI:            0.314 m AV Peak Grad:      8.4 mmHg AV Mean Grad:      5.0 mmHg LVOT Vmax:         132.00 cm/s LVOT Vmean:        86.700 cm/s LVOT VTI:          0.273 m LVOT/AV VTI ratio: 0.87  AORTA Ao Root diam: 3.70 cm MITRAL VALVE MV Area (PHT): 3.02 cm     SHUNTS MV Area VTI:   3.07 cm     Systemic VTI:  0.27 m MV Peak grad:  5.9 mmHg     Systemic Diam: 2.10 cm MV Mean grad:  2.0 mmHg MV Vmax:       1.21 m/s MV Vmean:      61.7 cm/s MV Decel Time: 251 msec MV E velocity: 63.90 cm/s MV A velocity: 106.00 cm/s MV E/A ratio:  0.60 Keller Paterson Electronically signed by Keller Paterson Signature Date/Time: 11/19/2023/10:47:17 AM    Final    CT Angio Chest Pulmonary Embolism (PE) W or WO Contrast Result Date: 11/17/2023 CLINICAL DATA:  Status post left knee replacement surgery, with a positive D-dimer suspected pulmonary embolism. EXAM: CT ANGIOGRAPHY CHEST WITH CONTRAST TECHNIQUE: Multidetector CT imaging of the chest was performed using the standard protocol during bolus administration of intravenous contrast. Multiplanar CT image reconstructions and MIPs were obtained to evaluate the vascular  anatomy. RADIATION DOSE REDUCTION: This exam was performed according to the departmental dose-optimization program which includes automated exposure control, adjustment of the mA and/or kV according to patient size and/or use of iterative reconstruction technique. CONTRAST:  OMNIPAQUE  IOHEXOL  350 MG/ML SOLN COMPARISON:  CTA  chest 02/27/2023 FINDINGS: Cardiovascular: No arterial embolism is seen. The pulmonary trunk is slightly prominent at 3 cm which suggests a degree of pulmonary hypertension, was previously 2.7 cm. There is mild aortic atherosclerosis. The great vessels are unremarkable. No aneurysm, stenosis or dissection is seen. Pulmonary veins are nondistended. The cardiac size is normal. There is no pericardial effusion. There are scattered calcific plaques of the LAD and right coronary arteries. Mediastinum/Nodes: Stable few slightly prominent bilateral hilar lymph nodes, scattered borderline sized mediastinal nodes. No new or progressive adenopathy. The lower poles of the thyroid  gland, axillary spaces, thoracic trachea, thoracic esophagus are unremarkable. Lungs/Pleura: The lungs are moderately emphysematous with centrilobular changes predominating. Diffuse bronchial thickening. Small caliber central airways which could be due to respiration, reactive airways disease or bronchospasm. There are scattered linear scar-like opacities. There is no consolidation, effusion or pneumothorax. Upper Abdomen: No acute abnormality. Musculoskeletal: No chest wall abnormality. No acute or significant osseous findings. Old left clavicular fracture fixation plating is partially visible. Review of the MIP images confirms the above findings. IMPRESSION: 1. No evidence of arterial embolus. 2. Slightly prominent pulmonary trunk at 3 cm suggesting a degree of pulmonary hypertension. 3. Aortic and coronary artery atherosclerosis. 4. Emphysema and bronchitis.  No focal pneumonia. 5. Small caliber central airways. Query  whether due to respiration, bronchospasm or reactive airway disease. 6. Stable slightly prominent hilar and borderline sized mediastinal nodes. Aortic Atherosclerosis (ICD10-I70.0) and Emphysema (ICD10-J43.9). Electronically Signed   By: Francis Quam M.D.   On: 11/17/2023 04:16   DG Knee Left Port Result Date: 11/16/2023 CLINICAL DATA:  68 year old male status post knee replacement. EXAM: PORTABLE LEFT KNEE - 1-2 VIEW COMPARISON:  None Available. FINDINGS: AP and cross-table lateral views 1115 hours. Left total knee arthroplasty hardware appears intact and aligned. Suprapatellar joint space operative drain. Postoperative changes to the undersurface of the patella. Anterior skin staples. No unexpected osseous changes. IMPRESSION: Left total knee arthroplasty with no adverse features. Electronically Signed   By: VEAR Hurst M.D.   On: 11/16/2023 11:25    Microbiology: Results for orders placed or performed during the hospital encounter of 11/12/23  Surgical pcr screen     Status: None   Collection Time: 11/12/23 10:31 AM   Specimen: Nasal Mucosa; Nasal Swab  Result Value Ref Range Status   MRSA, PCR NEGATIVE NEGATIVE Final   Staphylococcus aureus NEGATIVE NEGATIVE Final    Comment: (NOTE) The Xpert SA Assay (FDA approved for NASAL specimens in patients 63 years of age and older), is one component of a comprehensive surveillance program. It is not intended to diagnose infection nor to guide or monitor treatment. Performed at [facility], [address] [place], [place] [address]     Labs: CBC: Recent Labs  Lab 11/17/23 [id number] 11/18/23 0406 11/19/23 0615 11/20/23 0458  WBC 16.7* 8.9 9.2 10.2  HGB 9.7* 8.4* 8.4* 8.2*  HCT 29.9* 25.8* 25.9* 24.8*  MCV 87.7 89.6 89.0 87.6  PLT 218 172 170 177   Basic Metabolic Panel: Recent Labs  Lab 11/17/23 0643 11/18/23 0406 11/19/23 0615 11/20/23 0458  NA 135 134* 134* 133*  K 4.1 4.0 3.8 3.8  CL 103 100 97* 101  CO2 26 25 27 25    GLUCOSE 149* 85 87 142*  BUN 21 27* 22 17  CREATININE 0.96 0.96 1.04 0.95  CALCIUM  8.4* 8.2* 7.9* 8.0*   Liver Function Tests: Recent Labs  Lab 11/17/23 0643  AST 21  ALT 15  ALKPHOS 84  BILITOT 0.3  PROT 6.5  ALBUMIN 3.5   CBG: No results for input(s): GLUCAP in the last 168 hours.  Discharge time spent:  36 minutes.  Signed: Drue ONEIDA Potter, MD [facility] 11/20/2023

## 2023-11-20 NOTE — Progress Notes (Signed)
 Physical Therapy Treatment Patient Details Name: Joe Dillon MRN: [id number] DOB: [birth year] Today's Date: 11/20/2023   History of Present Illness Joe Dillon is a 67yoM who comes to [facility] on 11/16/23 for elective Left TKA. Pt was here for Rt TKA in October 2024. PMH: COPD, coronary artery disease, diastolic dysfunction, GERD, hypertension and dyslipidemia.  Patient was doing well postoperatively when he started developing chest tightness; admitted for chest pain and anginal symptoms.  NSTEMI s/p cardiac cath procedure    PT Comments  Patient is eager to be discharged. Patient ambulated in hallway with no shortness of breath or chest pain reported. Reinforced positioning instructions to promote knee ROM. Mobility is adequate for discharge home with family support. PT will continue to follow if patient does not discharge as anticipated.     If plan is discharge home, recommend the following: Assist for transportation;Help with stairs or ramp for entrance   Can travel by private vehicle        Equipment Recommendations  BSC/3in1    Recommendations for Other Services       Precautions / Restrictions Precautions Precautions: Fall;Knee Restrictions Weight Bearing Restrictions Per Provider Order: Yes LLE Weight Bearing Per Provider Order: Weight bearing as tolerated     Mobility  Bed Mobility Overal bed mobility: Modified Independent                  Transfers Overall transfer level: Modified independent Equipment used: Rolling walker (2 wheels)               General transfer comment: no physical assistance required. cues to avoid pushing with R wrist due to recent cardiac procedure    Ambulation/Gait Ambulation/Gait assistance: Modified independent (Device/Increase time) Gait Distance (Feet): 100 Feet Assistive device: Rolling walker (2 wheels) Gait Pattern/deviations: Step-through pattern, Decreased stride length       General Gait Details: cues to  avoid heavy weight bearing through the right wrist for safety due to recent cardiac procedure with carry over demonstrated. no loss of balance with hallway ambulation. no chest pain reported, no dyspnea.   Stairs Stairs:  (patient declined, however verbally instructed patient on sequencing)           Wheelchair Mobility     Tilt Bed    Modified Rankin (Stroke Patients Only)       Balance Overall balance assessment: Needs assistance Sitting-balance support: Feet supported Sitting balance-Leahy Scale: Good     Standing balance support: Bilateral upper extremity supported Standing balance-Leahy Scale: Fair                              Cognition Arousal: Alert Behavior During Therapy: WFL for tasks assessed/performed Overall Cognitive Status: Within Functional Limits for tasks assessed                                          Exercises      General Comments        Pertinent Vitals/Pain Pain Assessment Pain Assessment: 0-10 Pain Score: 4  Pain Location: Left knee Pain Descriptors / Indicators: Discomfort Pain Intervention(s): Limited activity within patient's tolerance, Monitored during session, Repositioned (encouraged polar care)    Home Living  Prior Function            PT Goals (current goals can now be found in the care plan section) Acute Rehab PT Goals Patient Stated Goal: go home PT Goal Formulation: With patient Time For Goal Achievement: 11/30/23 Potential to Achieve Goals: Good Progress towards PT goals: Progressing toward goals    Frequency    BID      PT Plan      Co-evaluation              AM-PAC PT 6 Clicks Mobility   Outcome Measure  Help needed turning from your back to your side while in a flat bed without using bedrails?: None Help needed moving from lying on your back to sitting on the side of a flat bed without using bedrails?: None Help needed  moving to and from a bed to a chair (including a wheelchair)?: None Help needed standing up from a chair using your arms (e.g., wheelchair or bedside chair)?: None Help needed to walk in hospital room?: None Help needed climbing 3-5 steps with a railing? : A Little 6 Click Score: 23    End of Session Equipment Utilized During Treatment: Gait belt Activity Tolerance: Patient tolerated treatment well Patient left: in chair;with call bell/phone within reach (polar care re-applied) Nurse Communication: Mobility status PT Visit Diagnosis: Other abnormalities of gait and mobility (R26.89);Muscle weakness (generalized) (M62.81);Difficulty in walking, not elsewhere classified (R26.2)     Time: 9189-9166 PT Time Calculation (min) (ACUTE ONLY): 23 min  Charges:    $Gait Training: 8-22 mins $Therapeutic Activity: 8-22 mins PT General Charges $$ ACUTE PT VISIT: 1 Visit                     Randine Essex, PT, MPT    Randine LULLA Essex 11/20/2023, 10:10 AM

## 2023-11-20 NOTE — Progress Notes (Signed)
 [facility] CLINIC CARDIOLOGY PROGRESS NOTE   Patient ID: Joe Dillon MRN: [id number] DOB/AGE: August 01, 1956 68 y.o.  Admit date: 11/16/2023 Referring Physician Dr. Madison Quill  Primary Physician Sparks, Reyes [name], MD  Primary Cardiologist Dr. Florencio Reason for Consultation NSTEMI  HPI: Joe Dillon is a 68 y.o. male with a past medical history of coronary artery disease, hypertension, hyperlipidemia, COPD who underwent TKA on 11/16/2023 and on 1/11 developed chest tightness. Troponins found to be elevated. Cardiology was consulted for further evaluation.   Interval History:  -Patient seen and examined this AM, states he feels well.  -Denies any recurrence of chest pain. Also denies any SOB.  -BP and HR remain stable.  -R radial access site without tenderness, evidence of bleeding/bruising.   Review of systems complete and found to be negative unless listed above    Vitals:   11/19/23 1927 11/20/23 0001 11/20/23 0424 11/20/23 0749  BP: (!) 126/53 103/84 130/70 123/68  Pulse: 78 78 86 72  Resp: 20 18 18 16   Temp: 98.8 F (37.1 C) 98.2 F (36.8 C) 98.4 F (36.9 C) 98.3 F (36.8 C)  TempSrc:      SpO2: 96% 91% 96% 95%  Weight:      Height:         Intake/Output Summary (Last 24 hours) at 11/20/2023 0858 Last data filed at 11/20/2023 0500 Gross per 24 hour  Intake 1465.07 ml  Output 2375 ml  Net -909.93 ml     PHYSICAL EXAM General: Well appearing male, well nourished, in no acute distress sitting upright in bedside chair. HEENT: Normocephalic and atraumatic. Neck: No JVD.  Lungs: Normal respiratory effort on room air. Clear bilaterally to auscultation. No wheezes, crackles, rhonchi.  Heart: HRRR. Normal S1 and S2 without gallops or murmurs. Radial & DP pulses 2+ bilaterally. Abdomen: Non-distended appearing.  Msk: Normal strength and tone for age. Extremities: No clubbing, cyanosis or edema.   Neuro: Alert and oriented X 3. Psych: Mood appropriate, affect  congruent.    LABS: Basic Metabolic Panel: Recent Labs    11/19/23 0615 11/20/23 0458  NA 134* 133*  K 3.8 3.8  CL 97* 101  CO2 27 25  GLUCOSE 87 142*  BUN 22 17  CREATININE 1.04 0.95  CALCIUM  7.9* 8.0*   Liver Function Tests: No results for input(s): AST, ALT, ALKPHOS, BILITOT, PROT, ALBUMIN in the last 72 hours.  No results for input(s): LIPASE, AMYLASE in the last 72 hours. CBC: Recent Labs    11/19/23 0615 11/20/23 0458  WBC 9.2 10.2  HGB 8.4* 8.2*  HCT 25.9* 24.8*  MCV 89.0 87.6  PLT 170 177   Cardiac Enzymes: Recent Labs    11/17/23 2028 11/17/23 2144 11/18/23 0406  TROPONINIHS 9,868* 9,594* 9,934*   BNP: No results for input(s): BNP in the last 72 hours. D-Dimer: No results for input(s): DDIMER in the last 72 hours.  Hemoglobin A1C: No results for input(s): HGBA1C in the last 72 hours. Fasting Lipid Panel: Recent Labs    11/18/23 0406  CHOL 84  HDL 44  LDLCALC 31  TRIG 45  CHOLHDL 1.9   Thyroid  Function Tests: No results for input(s): TSH, T4TOTAL, T3FREE, THYROIDAB in the last 72 hours.  Invalid input(s): FREET3 Anemia Panel: Recent Labs    11/19/23 0615  VITAMINB12 263  FOLATE 13.7  FERRITIN 41  TIBC 298  IRON 24*  RETICCTPCT 1.7    ECHOCARDIOGRAM COMPLETE Result Date: 11/19/2023    ECHOCARDIOGRAM REPORT  Patient Name:   Joe Dillon Date of Exam: 11/18/2023 Medical Rec #:  [id number]        Height:       70.0 in Accession #:    [id number]       Weight:       195.0 lb Date of Birth:  May 18, 1956        BSA:          2.065 m Patient Age:    67 years         BP:           101/61 mmHg Patient Gender: M                HR:           75 bpm. Exam Location:  [facility] Procedure: 2D Echo, Cardiac Doppler, Color Doppler and Strain Analysis Indications:     Acute MI  History:         Patient has no prior history of Echocardiogram examinations.                  Acute MI and CAD, Abnormal ECG, COPD,  Signs/Symptoms:Chest Pain                  and Fatigue; Risk Factors:Hypertension and Former Smoker.  Sonographer:     Naomie Reef Referring Phys:  [id number] DWAYNE D CALLWOOD Diagnosing Phys: Keller Paterson  Sonographer Comments: Technically difficult study due to poor echo windows. Image acquisition challenging due to COPD. Global longitudinal strain was attempted. IMPRESSIONS  1. Left ventricular ejection fraction, by estimation, is 45 to 50%. The left ventricle has mildly decreased function. The left ventricle demonstrates regional wall motion abnormalities (see scoring diagram/findings for description). Left ventricular diastolic parameters are consistent with Grade I diastolic dysfunction (impaired relaxation).  2. Right ventricular systolic function is normal. The right ventricular size is normal.  3. The mitral valve is normal in structure. Trivial mitral valve regurgitation.  4. The aortic valve was not well visualized. There is mild thickening of the aortic valve. Aortic valve regurgitation is not visualized. No aortic stenosis is present.  5. The inferior vena cava is normal in size with greater than 50% respiratory variability, suggesting right atrial pressure of 3 mmHg. FINDINGS  Left Ventricle: Left ventricular ejection fraction, by estimation, is 45 to 50%. The left ventricle has mildly decreased function. The left ventricle demonstrates regional wall motion abnormalities. The left ventricular internal cavity size was normal in size. There is no left ventricular hypertrophy. Left ventricular diastolic parameters are consistent with Grade I diastolic dysfunction (impaired relaxation).  LV Wall Scoring: The entire inferior wall, mid inferoseptal segment, and basal inferoseptal segment are hypokinetic. The entire anterior wall, entire lateral wall, entire anterior septum, and apex are normal. Right Ventricle: The right ventricular size is normal. No increase in right ventricular wall thickness. Right  ventricular systolic function is normal. Left Atrium: Left atrial size was normal in size. Right Atrium: Right atrial size was normal in size. Pericardium: There is no evidence of pericardial effusion. Mitral Valve: The mitral valve is normal in structure. Trivial mitral valve regurgitation. MV peak gradient, 5.9 mmHg. The mean mitral valve gradient is 2.0 mmHg. Tricuspid Valve: The tricuspid valve is normal in structure. Tricuspid valve regurgitation is not demonstrated. Aortic Valve: The aortic valve was not well visualized. There is mild thickening of the aortic valve. Aortic valve regurgitation is not visualized. No aortic stenosis is present. Aortic valve mean  gradient measures 5.0 mmHg. Aortic valve peak gradient measures 8.4 mmHg. Aortic valve area, by VTI measures 3.01 cm. Pulmonic Valve: The pulmonic valve was not well visualized. Pulmonic valve regurgitation is trivial. Aorta: The aortic root is normal in size and structure. Venous: The inferior vena cava is normal in size with greater than 50% respiratory variability, suggesting right atrial pressure of 3 mmHg. IAS/Shunts: The atrial septum is grossly normal.  LEFT VENTRICLE PLAX 2D LVIDd:         5.60 cm      Diastology LVIDs:         4.10 cm      LV e' medial:    9.25 cm/s LV PW:         1.00 cm      LV E/e' medial:  6.9 LV IVS:        1.00 cm      LV e' lateral:   10.10 cm/s LVOT diam:     2.10 cm      LV E/e' lateral: 6.3 LV SV:         95 LV SV Index:   46 LVOT Area:     3.46 cm  LV Volumes (MOD) LV vol d, MOD A2C: 117.0 ml LV vol d, MOD A4C: 85.5 ml LV vol s, MOD A2C: 64.2 ml LV vol s, MOD A4C: 35.1 ml LV SV MOD A2C:     52.8 ml LV SV MOD A4C:     85.5 ml LV SV MOD BP:      52.7 ml RIGHT VENTRICLE RV Basal diam:  3.65 cm RV Mid diam:    3.30 cm RV S prime:     15.90 cm/s TAPSE (M-mode): 2.8 cm LEFT ATRIUM             Index        RIGHT ATRIUM           Index LA diam:        3.80 cm 1.84 cm/m   RA Area:     18.10 cm LA Vol (A2C):   52.6 ml 25.47  ml/m  RA Volume:   56.10 ml  27.17 ml/m LA Vol (A4C):   54.2 ml 26.25 ml/m LA Biplane Vol: 56.8 ml 27.51 ml/m  AORTIC VALVE                     PULMONIC VALVE AV Area (Vmax):    3.15 cm      PV Vmax:       1.10 m/s AV Area (Vmean):   2.97 cm      PV Peak grad:  4.8 mmHg AV Area (VTI):     3.01 cm AV Vmax:           145.00 cm/s AV Vmean:          101.000 cm/s AV VTI:            0.314 m AV Peak Grad:      8.4 mmHg AV Mean Grad:      5.0 mmHg LVOT Vmax:         132.00 cm/s LVOT Vmean:        86.700 cm/s LVOT VTI:          0.273 m LVOT/AV VTI ratio: 0.87  AORTA Ao Root diam: 3.70 cm MITRAL VALVE MV Area (PHT): 3.02 cm     SHUNTS MV Area VTI:   3.07 cm     Systemic VTI:  0.27 m MV Peak grad:  5.9 mmHg     Systemic Diam: 2.10 cm MV Mean grad:  2.0 mmHg MV Vmax:       1.21 m/s MV Vmean:      61.7 cm/s MV Decel Time: 251 msec MV E velocity: 63.90 cm/s MV A velocity: 106.00 cm/s MV E/A ratio:  0.60 Keller Paterson Electronically signed by Keller Paterson Signature Date/Time: 11/19/2023/10:47:17 AM    Final      ECHO as above  TELEMETRY reviewed by me 11/20/23: sinus rhythm rate 90s  EKG reviewed by me 11/20/23: NSR rate 87 bpm, nonacute  DATA reviewed by me 11/20/23: last 24h vitals tele labs imaging I/O, hospitalist progress note, ortho notes  Principal Problem:   NSTEMI (non-ST elevated myocardial infarction) (HCC) Active Problems:   Essential hypertension   History of total knee arthroplasty, left   Chronic obstructive pulmonary disease (COPD) (HCC)   GERD without esophagitis   Coronary artery disease    ASSESSMENT AND PLAN: Joe Dillon is a 68 y.o. male with a past medical history of coronary artery disease, hypertension, hyperlipidemia, COPD who underwent TKA on 11/16/2023 and on 1/11 developed chest tightness. Troponins found to be elevated. Cardiology was consulted for further evaluation.   # NSTEMI # Coronary artery disease # Hypertension Patient with known hx of CAD, underwent  LHC 03/2023 without any significant lesions requiring intervention. Underwent TKA on 1/10 and one day later developed chest tightness. Troponins 29 > 120 > 9868 > 9594 > 9934. LHC with 90% proximal RCA lesion, s/p DES 2.75 x 15 mm frontier Onyx.   -Continue DAPT with aspirin  81 mg daily and brilinta  90 mg twice daily.  -Continue amlodipine  10 mg daily, losartan  100 mg daily, hydrochlorothiazide  12.5 mg daily, metoprolol  tartrate 50 mg twice daily.  -Continue rosuvastatin  40 mg daily.   Ok for discharge today from a cardiac perspective. Will arrange for follow up in clinic with Dr. Florencio in 1-2 weeks.    This patient's case was discussed and created with Dr. Paterson and he is in agreement.  Signed:  Danita Bloch, PA-C  11/20/2023, 8:58 AM [facility] Cardiology

## 2023-11-20 NOTE — Progress Notes (Signed)
 Occupational Therapy Treatment Patient Details Name: Joe Dillon MRN: [id number] DOB: 20-Apr-1956 Today's Date: 11/20/2023   History of present illness Joe Dillon is a 67yoM who comes to [facility] on 11/16/23 for elective Left TKA. Pt was here for Rt TKA in October 2024. PMH: COPD, coronary artery disease, diastolic dysfunction, GERD, hypertension and dyslipidemia.  Patient was doing well postoperatively when he started developing chest tightness; admitted for chest pain and anginal symptoms.  NSTEMI s/p cardiac cath procedure   OT comments  Chart reviewed, pt in room, reports he is ready to discharge. OT session targeted improving safe ADL completion with precautions. Pt performs LB dressing with MOD A (assist for threading underwear/pants). Reports he feels comfortable with polar care use, ADL completion with AE and has his wife to assist. No further OT needs identified at this time. Pt is left sitting on edge of bed, nursing notified.       If plan is discharge home, recommend the following:  A little help with walking and/or transfers;A little help with bathing/dressing/bathroom   Equipment Recommendations  None recommended by OT    Recommendations for Other Services      Precautions / Restrictions Precautions Precautions: Fall;Knee Restrictions Weight Bearing Restrictions Per Provider Order: Yes LLE Weight Bearing Per Provider Order: Weight bearing as tolerated       Mobility Bed Mobility Overal bed mobility: Modified Independent                  Transfers Overall transfer level: Modified independent Equipment used: Rolling walker (2 wheels) Transfers: Sit to/from Stand Sit to Stand: Modified independent (Device/Increase time)           General transfer comment: educated on R wrist precautions post cath     Balance Overall balance assessment: Needs assistance Sitting-balance support: Feet supported Sitting balance-Leahy Scale: Good     Standing balance  support: Bilateral upper extremity supported Standing balance-Leahy Scale: Fair                             ADL either performed or assessed with clinical judgement   ADL Overall ADL's : Needs assistance/impaired                                       General ADL Comments: MOD A for LB dressing (underwear and pants), report wife will help him; anticipate SET UP for UB dressing, SET UP for grooming tasks    Extremity/Trunk Assessment              Vision       Perception     Praxis      Cognition Arousal: Alert Behavior During Therapy: WFL for tasks assessed/performed Overall Cognitive Status: Within Functional Limits for tasks assessed                                          Exercises Other Exercises Other Exercises: edu re: LB dressing, polar care use, safe ADL completion; pt declines hand out    Shoulder Instructions       General Comments vss throughout    Pertinent Vitals/ Pain       Pain Assessment Pain Assessment: No/denies pain  Home Living  Prior Functioning/Environment              Frequency  Min 1X/week        Progress Toward Goals  OT Goals(current goals can now be found in the care plan section)  Progress towards OT goals: Progressing toward goals     Plan      Co-evaluation                 AM-PAC OT 6 Clicks Daily Activity     Outcome Measure   Help from another person eating meals?: None Help from another person taking care of personal grooming?: None Help from another person toileting, which includes using toliet, bedpan, or urinal?: None Help from another person bathing (including washing, rinsing, drying)?: A Little Help from another person to put on and taking off regular upper body clothing?: None Help from another person to put on and taking off regular lower body clothing?: A Little 6 Click Score: 22     End of Session    OT Visit Diagnosis: Other abnormalities of gait and mobility (R26.89)   Activity Tolerance Patient tolerated treatment well   Patient Left in chair;with call bell/phone within reach;with chair alarm set   Nurse Communication Mobility status        Time: 8968-8957 OT Time Calculation (min): 11 min  Charges: OT General Charges $OT Visit: 1 Visit OT Treatments $Self Care/Home Management : 8-22 mins  Therisa Sheffield, OTD OTR/L  11/20/23, 1:25 PM

## 2023-11-20 NOTE — Plan of Care (Signed)
  Problem: Education: Goal: Knowledge of General Education information will improve Description: Including pain rating scale, medication(s)/side effects and non-pharmacologic comfort measures Outcome: Progressing   Problem: Health Behavior/Discharge Planning: Goal: Ability to manage health-related needs will improve Outcome: Progressing   Problem: Clinical Measurements: Goal: Ability to maintain clinical measurements within normal limits will improve Outcome: Progressing Goal: Will remain free from infection Outcome: Progressing Goal: Diagnostic test results will improve Outcome: Progressing Goal: Respiratory complications will improve Outcome: Progressing Goal: Cardiovascular complication will be avoided Outcome: Progressing   Problem: Activity: Goal: Risk for activity intolerance will decrease Outcome: Progressing   Problem: Nutrition: Goal: Adequate nutrition will be maintained Outcome: Progressing   Problem: Elimination: Goal: Will not experience complications related to bowel motility Outcome: Progressing Goal: Will not experience complications related to urinary retention Outcome: Progressing   Problem: Pain Management: Goal: General experience of comfort will improve Outcome: Progressing   Problem: Safety: Goal: Ability to remain free from injury will improve Outcome: Progressing   Problem: Activity: Goal: Ability to avoid complications of mobility impairment will improve Outcome: Progressing Goal: Range of joint motion will improve Outcome: Progressing

## 2023-11-21 ENCOUNTER — Encounter: Payer: Self-pay | Admitting: Internal Medicine

## 2023-11-21 LAB — CARDIAC CATHETERIZATION: Cath EF Quantitative: 50 %

## 2024-04-02 ENCOUNTER — Encounter: Payer: Self-pay | Admitting: Intensive Care

## 2024-04-02 ENCOUNTER — Emergency Department

## 2024-04-02 ENCOUNTER — Inpatient Hospital Stay
Admission: EM | Admit: 2024-04-02 | Discharge: 2024-04-06 | DRG: 871 | Disposition: A | Discharge status: Home Health | Attending: Internal Medicine | Admitting: Internal Medicine

## 2024-04-02 ENCOUNTER — Other Ambulatory Visit: Payer: Self-pay

## 2024-04-02 DIAGNOSIS — B9789 Other viral agents as the cause of diseases classified elsewhere: Secondary | ICD-10-CM | POA: Diagnosis present

## 2024-04-02 DIAGNOSIS — N179 Acute kidney failure, unspecified: Secondary | ICD-10-CM | POA: Diagnosis present

## 2024-04-02 DIAGNOSIS — I2510 Atherosclerotic heart disease of native coronary artery without angina pectoris: Secondary | ICD-10-CM | POA: Diagnosis present

## 2024-04-02 DIAGNOSIS — E876 Hypokalemia: Secondary | ICD-10-CM | POA: Diagnosis present

## 2024-04-02 DIAGNOSIS — J440 Chronic obstructive pulmonary disease with acute lower respiratory infection: Secondary | ICD-10-CM | POA: Diagnosis present

## 2024-04-02 DIAGNOSIS — R6520 Severe sepsis without septic shock: Secondary | ICD-10-CM | POA: Diagnosis present

## 2024-04-02 DIAGNOSIS — J431 Panlobular emphysema: Secondary | ICD-10-CM | POA: Diagnosis present

## 2024-04-02 DIAGNOSIS — Z79899 Other long term (current) drug therapy: Secondary | ICD-10-CM | POA: Diagnosis not present

## 2024-04-02 DIAGNOSIS — J441 Chronic obstructive pulmonary disease with (acute) exacerbation: Secondary | ICD-10-CM | POA: Diagnosis present

## 2024-04-02 DIAGNOSIS — J9601 Acute respiratory failure with hypoxia: Secondary | ICD-10-CM | POA: Diagnosis present

## 2024-04-02 DIAGNOSIS — A419 Sepsis, unspecified organism: Secondary | ICD-10-CM | POA: Diagnosis present

## 2024-04-02 DIAGNOSIS — I5032 Chronic diastolic (congestive) heart failure: Secondary | ICD-10-CM | POA: Diagnosis present

## 2024-04-02 DIAGNOSIS — E8721 Acute metabolic acidosis: Secondary | ICD-10-CM | POA: Diagnosis present

## 2024-04-02 DIAGNOSIS — I110 Hypertensive heart disease with heart failure: Secondary | ICD-10-CM | POA: Diagnosis present

## 2024-04-02 DIAGNOSIS — J189 Pneumonia, unspecified organism: Secondary | ICD-10-CM | POA: Diagnosis present

## 2024-04-02 DIAGNOSIS — Z7982 Long term (current) use of aspirin: Secondary | ICD-10-CM

## 2024-04-02 DIAGNOSIS — Z955 Presence of coronary angioplasty implant and graft: Secondary | ICD-10-CM

## 2024-04-02 DIAGNOSIS — Z791 Long term (current) use of non-steroidal anti-inflammatories (NSAID): Secondary | ICD-10-CM

## 2024-04-02 DIAGNOSIS — Z7902 Long term (current) use of antithrombotics/antiplatelets: Secondary | ICD-10-CM | POA: Diagnosis not present

## 2024-04-02 DIAGNOSIS — I959 Hypotension, unspecified: Secondary | ICD-10-CM | POA: Diagnosis present

## 2024-04-02 DIAGNOSIS — Z87891 Personal history of nicotine dependence: Secondary | ICD-10-CM

## 2024-04-02 DIAGNOSIS — Z7951 Long term (current) use of inhaled steroids: Secondary | ICD-10-CM

## 2024-04-02 DIAGNOSIS — Z96653 Presence of artificial knee joint, bilateral: Secondary | ICD-10-CM | POA: Diagnosis present

## 2024-04-02 DIAGNOSIS — E785 Hyperlipidemia, unspecified: Secondary | ICD-10-CM | POA: Diagnosis present

## 2024-04-02 DIAGNOSIS — J449 Chronic obstructive pulmonary disease, unspecified: Secondary | ICD-10-CM | POA: Diagnosis present

## 2024-04-02 LAB — CBC WITH DIFFERENTIAL/PLATELET
Abs Immature Granulocytes: 1.1 10*3/uL — ABNORMAL HIGH (ref 0.00–0.07)
Band Neutrophils: 13 %
Basophils Absolute: 0 10*3/uL (ref 0.0–0.1)
Basophils Relative: 0 %
Eosinophils Absolute: 0 10*3/uL (ref 0.0–0.5)
Eosinophils Relative: 0 %
HCT: 39.6 % (ref 39.0–52.0)
Hemoglobin: 12.7 g/dL — ABNORMAL LOW (ref 13.0–17.0)
Lymphocytes Relative: 15 %
Lymphs Abs: 1.2 10*3/uL (ref 0.7–4.0)
MCH: 28.4 pg (ref 26.0–34.0)
MCHC: 32.1 g/dL (ref 30.0–36.0)
MCV: 88.6 fL (ref 80.0–100.0)
Metamyelocytes Relative: 9 %
Monocytes Absolute: 0.2 10*3/uL (ref 0.1–1.0)
Monocytes Relative: 2 %
Myelocytes: 4 %
Neutro Abs: 5.7 10*3/uL (ref 1.7–7.7)
Neutrophils Relative %: 57 %
Platelets: 185 10*3/uL (ref 150–400)
RBC: 4.47 MIL/uL (ref 4.22–5.81)
RDW: 13.1 % (ref 11.5–15.5)
Smear Review: NORMAL
WBC: 8.1 10*3/uL (ref 4.0–10.5)
nRBC: 0 % (ref 0.0–0.2)

## 2024-04-02 LAB — LACTIC ACID, PLASMA
Lactic Acid, Venous: 5.7 mmol/L (ref 0.5–1.9)
Lactic Acid, Venous: 4.7 mmol/L (ref 0.5–1.9)
Lactic Acid, Venous: 5 mmol/L (ref 0.5–1.9)

## 2024-04-02 LAB — URINALYSIS, W/ REFLEX TO CULTURE (INFECTION SUSPECTED)
Bilirubin Urine: NEGATIVE
Glucose, UA: NEGATIVE mg/dL
Hgb urine dipstick: NEGATIVE
Ketones, ur: NEGATIVE mg/dL
Leukocytes,Ua: NEGATIVE
Nitrite: NEGATIVE
Protein, ur: 100 mg/dL — AB
Specific Gravity, Urine: 1.018 (ref 1.005–1.030)
pH: 5 (ref 5.0–8.0)

## 2024-04-02 LAB — COMPREHENSIVE METABOLIC PANEL WITH GFR
ALT: 23 U/L (ref 0–44)
AST: 38 U/L (ref 15–41)
Albumin: 3.6 g/dL (ref 3.5–5.0)
Alkaline Phosphatase: 61 U/L (ref 38–126)
Anion gap: 14 (ref 5–15)
BUN: 31 mg/dL — ABNORMAL HIGH (ref 8–23)
CO2: 21 mmol/L — ABNORMAL LOW (ref 22–32)
Calcium: 8.7 mg/dL — ABNORMAL LOW (ref 8.9–10.3)
Chloride: 102 mmol/L (ref 98–111)
Creatinine, Ser: 2.53 mg/dL — ABNORMAL HIGH (ref 0.61–1.24)
GFR, Estimated: 27 mL/min — ABNORMAL LOW (ref >60)
Glucose, Bld: 145 mg/dL — ABNORMAL HIGH (ref 70–99)
Potassium: 3.4 mmol/L — ABNORMAL LOW (ref 3.5–5.1)
Sodium: 137 mmol/L (ref 135–145)
Total Bilirubin: 0.5 mg/dL (ref 0.0–1.2)
Total Protein: 6.8 g/dL (ref 6.5–8.1)

## 2024-04-02 LAB — RESP PANEL BY RT-PCR (RSV, FLU A&B, COVID)  RVPGX2
Influenza A by PCR: NEGATIVE
Influenza B by PCR: NEGATIVE
Resp Syncytial Virus by PCR: NEGATIVE
SARS Coronavirus 2 by RT PCR: NEGATIVE

## 2024-04-02 LAB — RESPIRATORY PANEL BY PCR

## 2024-04-02 LAB — PROTIME-INR
INR: 1.2 (ref 0.8–1.2)
Prothrombin Time: 15.3 s — ABNORMAL HIGH (ref 11.4–15.2)

## 2024-04-02 LAB — BLOOD GAS, VENOUS

## 2024-04-02 LAB — HIV ANTIBODY (ROUTINE TESTING W REFLEX): HIV Screen 4th Generation wRfx: NONREACTIVE

## 2024-04-02 LAB — GLUCOSE, CAPILLARY: Glucose-Capillary: 107 mg/dL — ABNORMAL HIGH (ref 70–99)

## 2024-04-02 LAB — MRSA NEXT GEN BY PCR, NASAL: MRSA by PCR Next Gen: NOT DETECTED

## 2024-04-02 MED ORDER — SODIUM CHLORIDE 0.9 % IV BOLUS (SEPSIS)
1000.0000 mL | Freq: Once | INTRAVENOUS | Status: AC
  Administered 2024-04-02: 1000 mL via INTRAVENOUS

## 2024-04-02 MED ORDER — HEPARIN SODIUM (PORCINE) 5000 UNIT/ML IJ SOLN
5000.0000 [IU] | Freq: Two times a day (BID) | INTRAMUSCULAR | Status: DC
  Administered 2024-04-02 – 2024-04-06 (×8): 5000 [IU] via SUBCUTANEOUS
  Filled 2024-04-02 (×8): qty 1

## 2024-04-02 MED ORDER — SODIUM CHLORIDE 0.9 % IV SOLN
500.0000 mg | INTRAVENOUS | Status: AC
  Administered 2024-04-03 – 2024-04-05 (×3): 500 mg via INTRAVENOUS
  Filled 2024-04-02 (×3): qty 5

## 2024-04-02 MED ORDER — GUAIFENESIN ER 600 MG PO TB12
1200.0000 mg | ORAL_TABLET | Freq: Two times a day (BID) | ORAL | Status: DC
  Administered 2024-04-02 – 2024-04-06 (×9): 1200 mg via ORAL
  Filled 2024-04-02 (×10): qty 2

## 2024-04-02 MED ORDER — PANTOPRAZOLE SODIUM 40 MG PO TBEC
40.0000 mg | DELAYED_RELEASE_TABLET | Freq: Every day | ORAL | Status: DC
  Administered 2024-04-02 – 2024-04-06 (×5): 40 mg via ORAL
  Filled 2024-04-02 (×5): qty 1

## 2024-04-02 MED ORDER — IPRATROPIUM-ALBUTEROL 0.5-2.5 (3) MG/3ML IN SOLN
3.0000 mL | Freq: Four times a day (QID) | RESPIRATORY_TRACT | Status: DC
  Administered 2024-04-02 – 2024-04-03 (×4): 3 mL via RESPIRATORY_TRACT
  Filled 2024-04-02 (×4): qty 3

## 2024-04-02 MED ORDER — OXYCODONE HCL 5 MG PO TABS: 5.0000 mg | ORAL_TABLET | ORAL | Status: DC | PRN

## 2024-04-02 MED ORDER — ACETAMINOPHEN 325 MG PO TABS: 650.0000 mg | ORAL_TABLET | Freq: Four times a day (QID) | ORAL | Status: DC | PRN

## 2024-04-02 MED ORDER — CLOPIDOGREL BISULFATE 75 MG PO TABS
75.0000 mg | ORAL_TABLET | Freq: Every day | ORAL | Status: DC
  Administered 2024-04-02 – 2024-04-06 (×5): 75 mg via ORAL
  Filled 2024-04-02 (×5): qty 1

## 2024-04-02 MED ORDER — VITAMIN C 500 MG PO TABS
1000.0000 mg | ORAL_TABLET | Freq: Every day | ORAL | Status: DC
  Administered 2024-04-03 – 2024-04-06 (×4): 1000 mg via ORAL
  Filled 2024-04-02 (×4): qty 2

## 2024-04-02 MED ORDER — BUDESON-GLYCOPYRROL-FORMOTEROL 160-9-4.8 MCG/ACT IN AERO
2.0000 | INHALATION_SPRAY | Freq: Two times a day (BID) | RESPIRATORY_TRACT | Status: DC
  Administered 2024-04-02 – 2024-04-03 (×2): 2 via RESPIRATORY_TRACT
  Filled 2024-04-02: qty 5.9

## 2024-04-02 MED ORDER — SODIUM CHLORIDE 0.9 % IV BOLUS
1000.0000 mL | Freq: Once | INTRAVENOUS | Status: AC
  Administered 2024-04-02: 1000 mL via INTRAVENOUS

## 2024-04-02 MED ORDER — ALBUTEROL SULFATE (2.5 MG/3ML) 0.083% IN NEBU
2.5000 mg | INHALATION_SOLUTION | Freq: Four times a day (QID) | RESPIRATORY_TRACT | Status: DC | PRN
  Administered 2024-04-04: 2.5 mg via RESPIRATORY_TRACT
  Filled 2024-04-02: qty 3

## 2024-04-02 MED ORDER — ROSUVASTATIN CALCIUM 10 MG PO TABS
40.0000 mg | ORAL_TABLET | ORAL | Status: DC
  Administered 2024-04-03 – 2024-04-06 (×4): 40 mg via ORAL
  Filled 2024-04-02 (×4): qty 4

## 2024-04-02 MED ORDER — SODIUM CHLORIDE 0.9 % IV SOLN
2.0000 g | INTRAVENOUS | Status: DC
  Administered 2024-04-03 – 2024-04-06 (×4): 2 g via INTRAVENOUS
  Filled 2024-04-02 (×4): qty 20

## 2024-04-02 MED ORDER — LABETALOL HCL 5 MG/ML IV SOLN: 10.0000 mg | INTRAVENOUS | Status: DC | PRN

## 2024-04-02 MED ORDER — ASPIRIN 81 MG PO TBEC
81.0000 mg | DELAYED_RELEASE_TABLET | Freq: Every day | ORAL | Status: DC
  Administered 2024-04-02 – 2024-04-06 (×5): 81 mg via ORAL
  Filled 2024-04-02 (×5): qty 1

## 2024-04-02 MED ORDER — POTASSIUM CHLORIDE CRYS ER 20 MEQ PO TBCR
20.0000 meq | EXTENDED_RELEASE_TABLET | Freq: Once | ORAL | Status: AC
  Administered 2024-04-02: 20 meq via ORAL
  Filled 2024-04-02: qty 1

## 2024-04-02 MED ORDER — SODIUM CHLORIDE 0.9 % IV SOLN
2.0000 g | Freq: Once | INTRAVENOUS | Status: AC
  Administered 2024-04-02: 2 g via INTRAVENOUS
  Filled 2024-04-02: qty 20

## 2024-04-02 MED ORDER — TICAGRELOR 90 MG PO TABS: 90.0000 mg | ORAL_TABLET | Freq: Two times a day (BID) | ORAL | Status: DC

## 2024-04-02 MED ORDER — ONDANSETRON HCL 4 MG PO TABS
4.0000 mg | ORAL_TABLET | Freq: Four times a day (QID) | ORAL | Status: DC | PRN
  Filled 2024-04-02: qty 1

## 2024-04-02 MED ORDER — METHYLPREDNISOLONE SODIUM SUCC 40 MG IJ SOLR
40.0000 mg | Freq: Two times a day (BID) | INTRAMUSCULAR | Status: DC
  Administered 2024-04-02 – 2024-04-05 (×7): 40 mg via INTRAVENOUS
  Filled 2024-04-02 (×7): qty 1

## 2024-04-02 MED ORDER — ACETAMINOPHEN 650 MG RE SUPP: 650.0000 mg | Freq: Four times a day (QID) | RECTAL | Status: DC | PRN

## 2024-04-02 MED ORDER — SODIUM CHLORIDE 0.9 % IV SOLN
500.0000 mg | Freq: Once | INTRAVENOUS | Status: AC
  Administered 2024-04-02: 500 mg via INTRAVENOUS
  Filled 2024-04-02: qty 5

## 2024-04-02 MED ORDER — SODIUM CHLORIDE 0.9 % IV SOLN: INTRAVENOUS | Status: DC

## 2024-04-02 MED ORDER — ONDANSETRON HCL 4 MG/2ML IJ SOLN: 4.0000 mg | Freq: Four times a day (QID) | INTRAMUSCULAR | Status: DC | PRN

## 2024-04-02 NOTE — Plan of Care (Signed)

## 2024-04-02 NOTE — ED Notes (Signed)
 Patients oxygen will periodically drop to 87% on RA and with coughing. Patient placed on 2L O2

## 2024-04-02 NOTE — ED Provider Notes (Signed)
 [facility] Provider Note    Event Date/Time   First MD Initiated Contact with Patient 04/02/24 [phone]     (approximate)   History   Cough, Fever, and Shortness of Breath   HPI  Joe Dillon is a 68 year old male with history of HTN, COPD presenting to the ER for evaluation of cough and shortness of breath.  On Monday, patient had onset of cough with progressive fevers, shortness of breath.  Denies chest pain.  Does report productive cough.  Does not use oxygen at baseline.    Physical Exam   Triage Vital Signs: ED Triage Vitals  Encounter Vitals Group     BP 04/02/24 0907 (!) 80/55     Systolic BP Percentile --      Diastolic BP Percentile --      Pulse Rate 04/02/24 0901 (!) 103     Resp 04/02/24 0901 (!) 24     Temp 04/02/24 0908 97.6 F (36.4 C)     Temp Source 04/02/24 0908 Oral     SpO2 04/02/24 0901 93 %     Weight 04/02/24 0905 195 lb (88.5 kg)     Height 04/02/24 0905 5\' 10"  (1.778 m)     Head Circumference --      Peak Flow --      Pain Score 04/02/24 0905 9     Pain Loc --      Pain Education --      Exclude from Growth Chart --     Most recent vital signs: Vitals:   04/02/24 1145 04/02/24 1200  BP: 99/82 (!) 92/55  Pulse: 95 92  Resp: (!) 36 (!) 34  Temp:    SpO2: 97% 98%     General: Awake, interactive  CV:  Regular rate, good peripheral perfusion.  Resp:  Tachypneic with mildly labored respirations, lung sounds coarse bilaterally most notably over the right base Abd:  Nondistended, soft, nontender Neuro:  Symmetric facial movement, fluid speech   ED Results / Procedures / Treatments   Labs (all labs ordered are listed, but only abnormal results are displayed) Labs Reviewed  COMPREHENSIVE METABOLIC PANEL WITH GFR - Abnormal; Notable for the following components:      Result Value   Potassium 3.4 (*)    CO2 21 (*)    Glucose, Bld 145 (*)    BUN 31 (*)    Creatinine, Ser 2.53 (*)    Calcium  8.7 (*)    GFR,  Estimated 27 (*)    All other components within normal limits  LACTIC ACID, PLASMA - Abnormal; Notable for the following components:   Lactic Acid, Venous 5.7 (*)    All other components within normal limits  LACTIC ACID, PLASMA - Abnormal; Notable for the following components:   Lactic Acid, Venous 4.7 (*)    All other components within normal limits  CBC WITH DIFFERENTIAL/PLATELET - Abnormal; Notable for the following components:   Hemoglobin 12.7 (*)    Abs Immature Granulocytes 1.10 (*)    All other components within normal limits  PROTIME-INR - Abnormal; Notable for the following components:   Prothrombin Time 15.3 (*)    All other components within normal limits  URINALYSIS, W/ REFLEX TO CULTURE (INFECTION SUSPECTED) - Abnormal; Notable for the following components:   Color, Urine YELLOW (*)    APPearance CLOUDY (*)    Protein, ur 100 (*)    Bacteria, UA FEW (*)    All other components within  normal limits  BLOOD GAS, VENOUS - Abnormal; Notable for the following components:   Acid-base deficit 3.0 (*)    All other components within normal limits  RESP PANEL BY RT-PCR (RSV, FLU A&B, COVID)  [id number]  CULTURE, BLOOD (ROUTINE X 2)  CULTURE, BLOOD (ROUTINE X 2)  URINE CULTURE  LACTIC ACID, PLASMA     EKG EKG independently reviewed interpreted by myself (ER attending) demonstrates:  EKG demonstrates sinus rhythm rate of 97, PR 150, QRS 102, QTc 455, nonspecific ST changes  RADIOLOGY Imaging independently reviewed and interpreted by myself demonstrates:  Chest x-Katielynn Horan with right basilar consolidation concerning for pneumonia  Formal Radiology Read:  [facility] Chest Port 1 View Result Date: 04/02/2024 CLINICAL DATA:  Sepsis, cough, diaphoresis, body aches EXAM: PORTABLE CHEST 1 VIEW COMPARISON:  02/27/2023 FINDINGS: 2 frontal views of the chest demonstrate a stable cardiac silhouette. New right basilar airspace disease consistent with bronchopneumonia. No effusion or pneumothorax.  Background emphysema. No acute bony abnormalities. IMPRESSION: 1. New right basilar airspace disease consistent with bronchopneumonia. 2. Background emphysema. Electronically Signed   By: Bobbye Burrow M.D.   On: 04/02/2024 09:37    PROCEDURES:  Critical Care performed: Yes, see critical care procedure note(s)  CRITICAL CARE Performed by: Claria Crofts   Total critical care time: 32 minutes  Critical care time was exclusive of separately billable procedures and treating other patients.  Critical care was necessary to treat or prevent imminent or life-threatening deterioration.  Critical care was time spent personally by me on the following activities: development of treatment plan with patient and/or surrogate as well as nursing, discussions with consultants, evaluation of patient's response to treatment, examination of patient, obtaining history from patient or surrogate, ordering and performing treatments and interventions, ordering and review of laboratory studies, ordering and review of radiographic studies, pulse oximetry and re-evaluation of patient's condition.   Procedures   MEDICATIONS ORDERED IN ED: Medications  sodium chloride  0.9 % bolus 1,000 mL (0 mLs Intravenous Stopped 04/02/24 1010)    And  sodium chloride  0.9 % bolus 1,000 mL (0 mLs Intravenous Stopped 04/02/24 1055)    And  sodium chloride  0.9 % bolus 1,000 mL (0 mLs Intravenous Stopped 04/02/24 1120)  cefTRIAXone (ROCEPHIN) 2 g in sodium chloride  0.9 % 100 mL IVPB (0 g Intravenous Stopped 04/02/24 1037)  azithromycin  (ZITHROMAX ) 500 mg in sodium chloride  0.9 % 250 mL IVPB (0 mg Intravenous Stopped 04/02/24 1120)     IMPRESSION / MDM / ASSESSMENT AND PLAN / ED COURSE  I reviewed the triage vital signs and the nursing notes.  Differential diagnosis includes, but is not limited to, pneumonia, viral illness, UTI, other infectious source, COPD exacerbation  Patient's presentation is most consistent with acute  presentation with potential threat to life or bodily function.  68 year old male presenting to the emergency department for evaluation of shortness of breath.  Tachycardic and tachypneic on presentation.  Sepsis orders initiated with empiric Rocephin and azithromycin .  X-Yuji Walth does confirm pneumonia.  Hypotensive on presentation, ordered for 30 cc/kg of fluid.   Clinical Course as of 04/02/24 1253  Wed Apr 02, 2024  1009 Lactic Acid, Venous(!!): 5.7 Lactate 5.7, already ordered for 30 cc/kg of IV fluids.  Sepsis reassessment performed.  Slight improvement blood pressure, most recent MAP with first bag of IV fluids still infusing. [NR]  1244 Reassessed after completion of fluids.  Blood pressure with sustained improvement.  Will reach out to hospitalist team to discuss admission. [NR]  1252 Case  reviewed with Dr. Jeane Miguel.  He will evaluate for anticipated admission. [NR]    Clinical Course User Index [NR] Claria Crofts, MD     FINAL CLINICAL IMPRESSION(S) / ED DIAGNOSES   Final diagnoses:  Community acquired pneumonia of right lower lobe of lung  Acute hypoxic respiratory failure (HCC)     Rx / DC Orders   ED Discharge Orders     None        Note:  This document was prepared using Dragon voice recognition software and may include unintentional dictation errors.   Claria Crofts, MD 04/02/24 [phone]

## 2024-04-02 NOTE — ED Triage Notes (Signed)
 Patient presents with cough, diaphoretic, diarrhea, fever, SOB, and body aches since Monday.

## 2024-04-02 NOTE — Progress Notes (Signed)
 CODE SEPSIS - PHARMACY COMMUNICATION  **Broad Spectrum Antibiotics should be administered within 1 hour of Sepsis diagnosis**  Time Code Sepsis Called/Page Received: 0945  Antibiotics Ordered: ceftriaxone  and azithromycin   Time of 1st antibiotic administration: 1007  Additional action taken by pharmacy: None  If necessary, Name of Provider/Nurse Contacted: None    Joe Dillon ,PharmD Clinical Pharmacist  04/02/2024  9:54 AM

## 2024-04-02 NOTE — Sepsis Progress Note (Signed)
 eLink is following this Code Sepsis.

## 2024-04-02 NOTE — ED Notes (Signed)
 RN notified MD about pt BP. Provider advised to give another 1L bolus.
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
## 2024-04-03 DIAGNOSIS — J189 Pneumonia, unspecified organism: Secondary | ICD-10-CM | POA: Diagnosis not present

## 2024-04-03 LAB — URINE CULTURE: Culture: <10000 — AB

## 2024-04-03 LAB — CBC
HCT: 31.6 % — ABNORMAL LOW (ref 39.0–52.0)
Hemoglobin: 10.1 g/dL — ABNORMAL LOW (ref 13.0–17.0)
MCH: 28.1 pg (ref 26.0–34.0)
MCHC: 32 g/dL (ref 30.0–36.0)
MCV: 88 fL (ref 80.0–100.0)
Platelets: 110 10*3/uL — ABNORMAL LOW (ref 150–400)
RBC: 3.59 MIL/uL — ABNORMAL LOW (ref 4.22–5.81)
RDW: 13.5 % (ref 11.5–15.5)
WBC: 5.9 10*3/uL (ref 4.0–10.5)
nRBC: 0 % (ref 0.0–0.2)

## 2024-04-03 LAB — BLOOD GAS, VENOUS
Bicarbonate: 24 mmol/L — ABNORMAL HIGH (ref 20.0–28.0)
O2 Saturation: 44 mmol/L — ABNORMAL HIGH (ref 0.0–2.0)
Patient temperature: 37
Patient temperature: 44 %
pCO2, Ven: 50 mmHg (ref 44–60)
pH, Ven: 7.29 (ref 7.25–7.43)
pO2, Ven: 24 mmol/L (ref 32–45)

## 2024-04-03 LAB — BASIC METABOLIC PANEL WITH GFR
Anion gap: 4 — ABNORMAL LOW (ref 5–15)
BUN: 32 mg/dL — ABNORMAL HIGH (ref 8–23)
CO2: 23 mmol/L (ref 22–32)
Calcium: 7.9 mg/dL — ABNORMAL LOW (ref 8.9–10.3)
Chloride: 109 mmol/L (ref 98–111)
Creatinine, Ser: 1.56 mg/dL — ABNORMAL HIGH (ref 0.61–1.24)
GFR, Estimated: 48 mL/min — ABNORMAL LOW (ref >60)
Glucose, Bld: 152 mg/dL — ABNORMAL HIGH (ref 70–99)
Potassium: 4.4 mmol/L (ref 3.5–5.1)
Sodium: 136 mmol/L (ref 135–145)

## 2024-04-03 LAB — LACTIC ACID, PLASMA: Lactic Acid, Venous: 3 mmol/L (ref 0.5–1.9)

## 2024-04-03 MED ORDER — IPRATROPIUM-ALBUTEROL 0.5-2.5 (3) MG/3ML IN SOLN
3.0000 mL | RESPIRATORY_TRACT | Status: DC
  Administered 2024-04-03 (×3): 3 mL via RESPIRATORY_TRACT
  Filled 2024-04-03 (×3): qty 3

## 2024-04-03 MED ORDER — BUDESONIDE 0.25 MG/2ML IN SUSP
0.2500 mg | Freq: Two times a day (BID) | RESPIRATORY_TRACT | Status: DC
  Administered 2024-04-03 – 2024-04-06 (×7): 0.25 mg via RESPIRATORY_TRACT
  Filled 2024-04-03 (×7): qty 2

## 2024-04-03 MED ORDER — IPRATROPIUM-ALBUTEROL 0.5-2.5 (3) MG/3ML IN SOLN
3.0000 mL | Freq: Three times a day (TID) | RESPIRATORY_TRACT | Status: DC
  Administered 2024-04-04 – 2024-04-05 (×4): 3 mL via RESPIRATORY_TRACT
  Filled 2024-04-03 (×4): qty 3

## 2024-04-03 MED ORDER — CHLORHEXIDINE GLUCONATE CLOTH 2 % EX PADS
6.0000 | MEDICATED_PAD | Freq: Every day | CUTANEOUS | Status: DC
  Administered 2024-04-03 – 2024-04-04 (×2): 6 via TOPICAL

## 2024-04-03 MED ORDER — ARFORMOTEROL TARTRATE 15 MCG/2ML IN NEBU
15.0000 ug | INHALATION_SOLUTION | Freq: Two times a day (BID) | RESPIRATORY_TRACT | Status: DC
  Administered 2024-04-03 – 2024-04-06 (×6): 15 ug via RESPIRATORY_TRACT
  Filled 2024-04-03 (×9): qty 2

## 2024-04-03 NOTE — Plan of Care (Signed)

## 2024-04-03 NOTE — Evaluation (Signed)
 Physical Therapy Evaluation Patient Details Name: Joe Dillon MRN: [id number] DOB: 04-Oct-1956 Today's Date: 04/03/2024  History of Present Illness  Joe Dillon is a 68 y.o. male with medical history significant of COPD, CAD, HTN, chronic HFpEF, GERD, multiple OA's status post bilateral TKA, presented with worsening of cough wheezing shortness of breath and fever.  Clinical Impression  Patient received in bed, he is sob. Agrees to PT/OT assessment. Patient reports he has been up to bathroom. He is independent with bed mobility, transfers with supervision. Patient ambulated over to recliner with supervision. Limited by sob. O2 sats on 2 liters down to 89% with minimal mobility. Patient demonstrates increased work of breathing and poor tolerance for activity. He will continue to benefit from skilled PT to improve mobility and independence.        If plan is discharge home, recommend the following: Help with stairs or ramp for entrance;Assist for transportation;A little help with walking and/or transfers;A little help with bathing/dressing/bathroom   Can travel by private vehicle    yes    Equipment Recommendations None recommended by PT  Recommendations for Other Services       Functional Status Assessment Patient has had a recent decline in their functional status and demonstrates the ability to make significant improvements in function in a reasonable and predictable amount of time.     Precautions / Restrictions Precautions Precautions: None Precaution/Restrictions Comments: low fall Restrictions Weight Bearing Restrictions Per Provider Order: No      Mobility  Bed Mobility Overal bed mobility: Modified Independent                  Transfers Overall transfer level: Modified independent Equipment used: None                    Ambulation/Gait Ambulation/Gait assistance: Modified independent (Device/Increase time) Gait Distance (Feet): 3  Feet Assistive device: None Gait Pattern/deviations: Step-through pattern Gait velocity: decr     General Gait Details: patient able to ambulate a few feet to recliner, no AD, limited by SOB  Stairs            Wheelchair Mobility     Tilt Bed    Modified Rankin (Stroke Patients Only)       Balance Overall balance assessment: Independent                                           Pertinent Vitals/Pain Pain Assessment Pain Assessment: No/denies pain    Home Living Family/patient expects to be discharged to:: Private residence Living Arrangements: Spouse/significant other Available Help at Discharge: Family;Available 24 hours/day Type of Home: House Home Access: Stairs to enter Entrance Stairs-Rails: Left Entrance Stairs-Number of Steps: 2   Home Layout: One level Home Equipment: Agricultural consultant (2 wheels)      Prior Function Prior Level of Function : Independent/Modified Independent;Driving             Mobility Comments: Ind amb community distances without an AD, plays golf regularly, no fall history ADLs Comments: Ind with ADLs     Extremity/Trunk Assessment   Upper Extremity Assessment Upper Extremity Assessment: Defer to OT evaluation    Lower Extremity Assessment Lower Extremity Assessment: Generalized weakness    Cervical / Trunk Assessment Cervical / Trunk Assessment: Normal  Communication   Communication Communication: No apparent difficulties    Cognition Arousal:  Alert Behavior During Therapy: WFL for tasks assessed/performed   PT - Cognitive impairments: No apparent impairments                         Following commands: Intact       Cueing Cueing Techniques: Verbal cues     General Comments      Exercises     Assessment/Plan    PT Assessment Patient needs continued PT services  PT Problem List Decreased strength;Decreased activity tolerance;Decreased mobility;Cardiopulmonary status  limiting activity       PT Treatment Interventions Gait training;Stair training;Functional mobility training;Therapeutic activities;Therapeutic exercise;Patient/family education    PT Goals (Current goals can be found in the Care Plan section)  Acute Rehab PT Goals Patient Stated Goal: improve, return home PT Goal Formulation: With patient Time For Goal Achievement: 04/18/24 Potential to Achieve Goals: Good    Frequency Min 3X/week     Co-evaluation               AM-PAC PT "6 Clicks" Mobility  Outcome Measure Help needed turning from your back to your side while in a flat bed without using bedrails?: None Help needed moving from lying on your back to sitting on the side of a flat bed without using bedrails?: None Help needed moving to and from a bed to a chair (including a wheelchair)?: A Little Help needed standing up from a chair using your arms (e.g., wheelchair or bedside chair)?: A Little Help needed to walk in hospital room?: A Little Help needed climbing 3-5 steps with a railing? : A Lot 6 Click Score: 19    End of Session Equipment Utilized During Treatment: Oxygen Activity Tolerance: Other (comment);Treatment limited secondary to medical complications (Comment) (Very sob with activity. O2 sats on 2 liters in low 90%s. Increased work of breathing) Patient left: in chair;with call bell/phone within reach;with chair alarm set Nurse Communication: Mobility status PT Visit Diagnosis: Other abnormalities of gait and mobility (R26.89);Muscle weakness (generalized) (M62.81);Difficulty in walking, not elsewhere classified (R26.2)    Time: 1610-9604 PT Time Calculation (min) (ACUTE ONLY): 15 min   Charges:   PT Evaluation $PT Eval Moderate Complexity: 1 Mod   PT General Charges $$ ACUTE PT VISIT: 1 Visit         Joe Dillon, PT, GCS 04/03/24,2:12 PM

## 2024-04-03 NOTE — Evaluation (Signed)
 Occupational Therapy Evaluation Patient Details Name: Joe Dillon MRN: [id number] DOB: 1956-01-29 Today's Date: 04/03/2024   History of Present Illness   68 y.o. male presented with worsening of cough, wheezing, shortness of breath and fever. Admitted with severe sepsis, acute hypoxic respiratory failure, CAP, rhinovirus and acute COPD exacerbation. PMH of COPD, CAD, HTN, chronic HFpEF, GERD, multiple OA's status post bilateral TKA.     Clinical Impressions Pt was seen for OT evaluation this date. PTA, pt reports living at home with his wife and being fully IND, driving, golfing, etc without AD use at baseline. Pt presents to acute OT demonstrating impaired ADL performance and functional mobility 2/2 low activity tolerance, mild weakness. Pt currently requires MOD I for bed mobility and supervision for transfers. Pt becomes very SOB and fatigued with minimal activity. Seated rest break, then pt able to stand and perform LB dressing to don his socks with feet propped on chair and unilateral support on chair with SBA. Pt again very SOB with minimal activity. Sp02 dropped to 89% and HR up to 132 with minimal activity with extended time to recover. Spent time educated on importance of upright positioning for his lungs and use of ECS, pacing and use of AE/AD/DME for task simplification to prevent overexertion. Spoke with pt regarding incentive spirometer use and asked nurse for approval to give him one-PT provided to pt. Pt would benefit from skilled OT services to address noted impairments and functional limitations to maximize safety and independence while minimizing falls risk and caregiver burden. Do not anticipate the need for follow up OT services upon acute hospital DC as long as his breathing improves.      If plan is discharge home, recommend the following:   A little help with walking and/or transfers;A little help with bathing/dressing/bathroom     Functional Status Assessment    Patient has had a recent decline in their functional status and demonstrates the ability to make significant improvements in function in a reasonable and predictable amount of time.     Equipment Recommendations   None recommended by OT     Recommendations for Other Services         Precautions/Restrictions   Precautions Precautions: None Precaution/Restrictions Comments: low fall Restrictions Weight Bearing Restrictions Per Provider Order: No     Mobility Bed Mobility Overal bed mobility: Modified Independent                  Transfers Overall transfer level: Modified independent Equipment used: None               General transfer comment: supervision level for transfers to recliner and STS from recliner      Balance Overall balance assessment: Independent                                         ADL either performed or assessed with clinical judgement   ADL Overall ADL's : Needs assistance/impaired     Grooming: Wash/dry face;Set up;Sitting               Lower Body Dressing: Supervision/safety;Contact guard assist;Sit to/from stand Lower Body Dressing Details (indicate cue type and reason): pt reports he places his foot on toilet to don socks in standing at home-simulated this date on chair without arms in room; pt very SOB but able to perform with SBA Toilet Transfer: Radiographer, therapeutic  Details (indicate cue type and reason): simulated to recliner         Functional mobility during ADLs: Supervision/safety       Vision         Perception         Praxis         Pertinent Vitals/Pain Pain Assessment Pain Assessment: No/denies pain     Extremity/Trunk Assessment Upper Extremity Assessment Upper Extremity Assessment: Overall WFL for tasks assessed   Lower Extremity Assessment Lower Extremity Assessment: Generalized weakness   Cervical / Trunk Assessment Cervical / Trunk Assessment:  Normal   Communication Communication Communication: No apparent difficulties   Cognition Arousal: Alert Behavior During Therapy: WFL for tasks assessed/performed                                 Following commands: Intact       Cueing  General Comments   Cueing Techniques: Verbal cues  very limited by his SOB, acute 02 needs from rhinovirus and PNA; low activity tolerance   Exercises Other Exercises Other Exercises: Edu on role of OT in acute setting, ECS, use of DME/AD/AE for task simplification, pacing etc during ADL performance to prevent overexertion. Other Exercises: Asked nurse if he could have incentive spirometer and PT provided it to patient and edu on use.   Shoulder Instructions      Home Living Family/patient expects to be discharged to:: Private residence Living Arrangements: Spouse/significant other Available Help at Discharge: Family;Available 24 hours/day Type of Home: House Home Access: Stairs to enter [employer] of Steps: 2 Entrance Stairs-Rails: Left Home Layout: One level     Bathroom Shower/Tub: Producer, television/film/video: Standard     Home Equipment: Agricultural consultant (2 wheels);Shower seat;Hand held shower head          Prior Functioning/Environment Prior Level of Function : Independent/Modified Independent;Driving             Mobility Comments: Ind amb community distances without an AD, plays golf regularly, no fall history ADLs Comments: Ind with ADLs    OT Problem List: Decreased activity tolerance;Decreased strength   OT Treatment/Interventions: Self-care/ADL training;Energy conservation;DME and/or AE instruction;Patient/family education      OT Goals(Current goals can be found in the care plan section)   Acute Rehab OT Goals Patient Stated Goal: return home OT Goal Formulation: With patient Time For Goal Achievement: 04/17/24 Potential to Achieve Goals: Good ADL Goals Pt Will Perform Lower  Body Bathing: with modified independence;with adaptive equipment;sitting/lateral leans;sit to/from stand Pt Will Perform Lower Body Dressing: with modified independence;with adaptive equipment;sitting/lateral leans;sit to/from stand Pt Will Transfer to Toilet: with modified independence;ambulating;regular height toilet Additional ADL Goal #1: Pt will demo implementation of 1 learned ECS during ADL performance 2/2 trials to prevent overexertion and maximize IND/safety.   OT Frequency:  Min 2X/week    Co-evaluation              AM-PAC OT "6 Clicks" Daily Activity     Outcome Measure Help from another person eating meals?: None Help from another person taking care of personal grooming?: None Help from another person toileting, which includes using toliet, bedpan, or urinal?: A Little Help from another person bathing (including washing, rinsing, drying)?: A Little Help from another person to put on and taking off regular upper body clothing?: A Little Help from another person to put on and taking off regular lower  body clothing?: A Little 6 Click Score: 20   End of Session Equipment Utilized During Treatment: Oxygen Nurse Communication: Mobility status  Activity Tolerance: Patient tolerated treatment well;Other (comment) (limited by SOB) Patient left: in chair;with chair alarm set;with call bell/phone within reach  OT Visit Diagnosis: Other abnormalities of gait and mobility (R26.89)                Time: 1341-1356 OT Time Calculation (min): 15 min Charges:  OT General Charges $OT Visit: 1 Visit OT Evaluation $OT Eval Low Complexity: 1 Low Jenette Rayson, OTR/L 04/03/24, 4:15 PM  Emmely Bittinger E Teniya Filter 04/03/2024, 4:10 PM

## 2024-04-03 NOTE — Progress Notes (Signed)
 PROGRESS NOTE    Joe Dillon  PPI:[id number] DOB: 09/02/1956 DOA: 04/02/2024 PCP: Yehuda Helms, MD    Brief Narrative:  68 y.o. male with medical history significant of COPD, CAD, HTN, chronic HFpEF, GERD, multiple OA's status post bilateral TKA, presented with worsening of cough wheezing shortness of breath and fever.   Symptoms started 2 days ago, patient started develop runny nose and sore throat, next day patient started to have productive cough with yellowish sputum and spiked a fever of 102 yesterday.  He has been taking OTC cough and fever medications with some relief.  Overnight however symptoms became worse and decided to come to the hospital.  Last 2 days he also has had significant decreased appetite and decreased oral intake including meals and fluid and have not seen significant decrease of urine output and darkened urine color.  But denies any dysuria no back pain.  Denied any nausea vomiting or diarrhea. ED Course: Afebrile, tachycardia heart rate 103 tachypneic and blood pressure 80/50 improved to 96/60 after 3 L of IV bolus.  Ulceration 88% on room air and stabilized on 3 L.  Chest x-ray showed right middle lobe and right lower lobe infiltrates.  Blood work showed WBC 8.1 hemoglobin 12.7 K3.4 bicarb 21 BUN 31 creatinine 2.8 compared to baseline 0.9 lactic acid 5.7> 4.2 trending after 3 L IV bolus given.   Assessment & Plan:   Principal Problem:   CAP (community acquired pneumonia) Active Problems:   Chronic obstructive pulmonary disease (COPD) (HCC)   Sepsis (HCC)  Severe sepsis, with acute endorgan damage - Severe sepsis evidenced by new onset of hypoxia, hypotension, which required IV boluses, source of infection is multifocal pneumonia and signs of endorgan damage as AKI.  Sepsis physiology improved Plan: Transfer to medical telemetry Continue Rocephin  and azithromycin  DC IV fluid resuscitation Encourage p.o. fluid intake Hold home BP meds Monitor vitals  and fever curve  Acute hypoxic respiratory failure CAP, bacterial Rhinovirus infection Acute COPD exacerbation - Secondary to combined effect of CAP as well as acute COPD exacerbation Plan: Respiratory isolation Antibiotics as above DuoNebs every 4 hours Twice daily Brovana , twice daily Pulmicort  Wean oxygen as tolerated  AKI Likely prerenal secondary to sepsis Appears euvolemic Plan: DC IV fluids   Hypokalemia Monitor and replace as needed   HTN Chronic HFpEF Home BP meds on hold.  As needed labetalol .  Hold IV fluids.  CAD No active chest pain.  EKG without signs of ischemia.  Continue DAPT aspirin  and Brilinta .   DVT prophylaxis: SQ heparin  Code Status: Full Family Communication: None.  Call offered, patient declined Disposition Plan: Status is: Inpatient Remains inpatient appropriate because: Sepsis on IV antibiotics   Level of care: Telemetry Medical  Consultants:  None  Procedures:  None  Antimicrobials: Ceftriaxone  Azithromycin    Subjective: Seen and examined.  Resting in bed.  Still short of breath but improving over interval.  Objective: Vitals:   04/03/24 0736 04/03/24 0800 04/03/24 0830 04/03/24 1011  BP:  117/77  131/76  Pulse:  (!) 112  (!) 115  Resp:    18  Temp:    98.4 F (36.9 C)  TempSrc:    Oral  SpO2: 99% 95% 97% 95%  Weight:      Height:        Intake/Output Summary (Last 24 hours) at 04/03/2024 1101 Last data filed at 04/02/2024 2346 Gross per 24 hour  Intake 2324.69 ml  Output 950 ml  Net 1374.69 ml  Filed Weights   04/02/24 0905 04/02/24 1451  Weight: 88.5 kg 73.5 kg    Examination:  General exam: Appears calm and comfortable  Respiratory system: Scattered crackles.  Diffuse end expiratory wheeze.  Tachypneic.  3 L Cardiovascular system: S1-S2, tachycardic, regular rhythm, no murmurs, no pedal edema Gastrointestinal system: Soft,/ND, normal bowel sounds Central nervous system: Alert and oriented. No focal  neurological deficits. Extremities: Symmetric 5 x 5 power. Skin: No rashes, lesions or ulcers Psychiatry: Judgement and insight appear normal. Mood & affect appropriate.     Data Reviewed: I have personally reviewed following labs and imaging studies  CBC: Recent Labs  Lab 04/02/24 0918 04/03/24 0518  WBC 8.1 5.9  NEUTROABS 5.7  --   HGB 12.7* 10.1*  HCT 39.6 31.6*  MCV 88.6 88.0  PLT 185 110*   Basic Metabolic Panel: Recent Labs  Lab 04/02/24 0918 04/03/24 0518  NA 137 136  K 3.4* 4.4  CL 102 109  CO2 21* 23  GLUCOSE 145* 152*  BUN 31* 32*  CREATININE 2.53* 1.56*  CALCIUM  8.7* 7.9*   GFR: Estimated Creatinine Clearance: 46.1 mL/min (A) (by C-G formula based on SCr of 1.56 mg/dL (H)). Liver Function Tests: Recent Labs  Lab 04/02/24 0918  AST 38  ALT 23  ALKPHOS 61  BILITOT 0.5  PROT 6.8  ALBUMIN 3.6   No results for input(s): "LIPASE", "AMYLASE" in the last 168 hours. No results for input(s): "AMMONIA" in the last 168 hours. Coagulation Profile: Recent Labs  Lab 04/02/24 0918  INR 1.2   Cardiac Enzymes: No results for input(s): "CKTOTAL", "CKMB", "CKMBINDEX", "TROPONINI" in the last 168 hours. BNP (last 3 results) No results for input(s): "PROBNP" in the last 8760 hours. HbA1C: No results for input(s): "HGBA1C" in the last 72 hours. CBG: Recent Labs  Lab 04/02/24 1429  GLUCAP 107*   Lipid Profile: No results for input(s): "CHOL", "HDL", "LDLCALC", "TRIG", "CHOLHDL", "LDLDIRECT" in the last 72 hours. Thyroid  Function Tests: No results for input(s): "TSH", "T4TOTAL", "FREET4", "T3FREE", "THYROIDAB" in the last 72 hours. Anemia Panel: No results for input(s): "VITAMINB12", "FOLATE", "FERRITIN", "TIBC", "IRON", "RETICCTPCT" in the last 72 hours. Sepsis Labs: Recent Labs  Lab 04/02/24 1610 04/02/24 1117 04/02/24 1226 04/03/24 0849  LATICACIDVEN 5.7* 4.7* 5.0* 3.0*    Recent Results (from the past 240 hours)  Culture, blood (Routine x 2)      Status: None (Preliminary result)   Collection Time: 04/02/24  9:18 AM   Specimen: BLOOD  Result Value Ref Range Status   Specimen Description BLOOD LEFT AC  Final   Special Requests   Final    BOTTLES DRAWN AEROBIC AND ANAEROBIC Blood Culture adequate volume   Culture   Final    NO GROWTH < 24 HOURS Performed at [facility], [address] [place], [place] [address]    Report Status PENDING  Incomplete  Culture, blood (Routine x 2)     Status: None (Preliminary result)   Collection Time: 04/02/24  9:35 AM   Specimen: BLOOD LEFT ARM  Result Value Ref Range Status   Specimen Description BLOOD LEFT ARM  Final   Special Requests   Final    BOTTLES DRAWN AEROBIC AND ANAEROBIC Blood Culture results may not be optimal due to an inadequate volume of blood received in culture bottles   Culture   Final    NO GROWTH < 24 HOURS Performed at [facility], [address], [place], [place] [address]  Report Status PENDING  Incomplete  Resp panel by RT-PCR (RSV, Flu A&B, Covid) Anterior Nasal Swab     Status: None   Collection Time: 04/02/24 10:12 AM   Specimen: Anterior Nasal Swab  Result Value Ref Range Status   SARS Coronavirus 2 by RT PCR NEGATIVE NEGATIVE Final    Comment: (NOTE) SARS-CoV-2 target nucleic acids are NOT DETECTED.  The SARS-CoV-2 RNA is generally detectable in upper respiratory specimens during the acute phase of infection. The lowest concentration of SARS-CoV-2 viral copies this assay can detect is 138 copies/mL. A negative result does not preclude SARS-Cov-2 infection and should not be used as the sole basis for treatment or other patient management decisions. A negative result may occur with  improper specimen collection/handling, submission of specimen other than nasopharyngeal swab, presence of viral mutation(s) within the areas targeted by this assay, and inadequate number of viral copies(<138 copies/mL). A negative result must be  combined with clinical observations, patient history, and epidemiological information. The expected result is Negative.  Fact Sheet for Patients:  BloggerCourse.com  Fact Sheet for Healthcare Providers:  SeriousBroker.it  This test is no t yet approved or cleared by the [place]  FDA and  has been authorized for detection and/or diagnosis of SARS-CoV-2 by FDA under an Emergency Use Authorization (EUA). This EUA will remain  in effect (meaning this test can be used) for the duration of the COVID-19 declaration under Section 564(b)(1) of the Act, 21 U.S.C.section 360bbb-3(b)(1), unless the authorization is terminated  or revoked sooner.       Influenza A by PCR NEGATIVE NEGATIVE Final   Influenza B by PCR NEGATIVE NEGATIVE Final    Comment: (NOTE) The Xpert Xpress SARS-CoV-2/FLU/RSV plus assay is intended as an aid in the diagnosis of influenza from Nasopharyngeal swab specimens and should not be used as a sole basis for treatment. Nasal washings and aspirates are unacceptable for Xpert Xpress SARS-CoV-2/FLU/RSV testing.  Fact Sheet for Patients: BloggerCourse.com  Fact Sheet for Healthcare Providers: SeriousBroker.it  This test is not yet approved or cleared by the [place]  FDA and has been authorized for detection and/or diagnosis of SARS-CoV-2 by FDA under an Emergency Use Authorization (EUA). This EUA will remain in effect (meaning this test can be used) for the duration of the COVID-19 declaration under Section 564(b)(1) of the Act, 21 U.S.C. section 360bbb-3(b)(1), unless the authorization is terminated or revoked.     Resp Syncytial Virus by PCR NEGATIVE NEGATIVE Final    Comment: (NOTE) Fact Sheet for Patients: BloggerCourse.com  Fact Sheet for Healthcare Providers: SeriousBroker.it  This test is not yet  approved or cleared by the [place]  FDA and has been authorized for detection and/or diagnosis of SARS-CoV-2 by FDA under an Emergency Use Authorization (EUA). This EUA will remain in effect (meaning this test can be used) for the duration of the COVID-19 declaration under Section 564(b)(1) of the Act, 21 U.S.C. section 360bbb-3(b)(1), unless the authorization is terminated or revoked.  Performed at [facility], [address] [place], [place] [address]   MRSA Next Gen by PCR, Nasal     Status: None   Collection Time: 04/02/24  2:35 PM   Specimen: Nasal Mucosa; Nasal Swab  Result Value Ref Range Status   MRSA by PCR Next Gen NOT DETECTED NOT DETECTED Final    Comment: (NOTE) The GeneXpert MRSA Assay (FDA approved for NASAL specimens only), is one component of a comprehensive MRSA colonization surveillance program. It is not intended to  diagnose MRSA infection nor to guide or monitor treatment for MRSA infections. Test performance is not FDA approved in patients less than 54 years old. Performed at [facility], [address] [place], [place] [address]   Respiratory (~20 pathogens) panel by PCR     Status: Abnormal   Collection Time: 04/02/24  3:03 PM   Specimen: Nasopharyngeal Swab; Respiratory  Result Value Ref Range Status   Adenovirus NOT DETECTED NOT DETECTED Final   Coronavirus 229E NOT DETECTED NOT DETECTED Final    Comment: (NOTE) The Coronavirus on the Respiratory Panel, DOES NOT test for the novel  Coronavirus (2019 nCoV)    Coronavirus HKU1 NOT DETECTED NOT DETECTED Final   Coronavirus NL63 NOT DETECTED NOT DETECTED Final   Coronavirus OC43 NOT DETECTED NOT DETECTED Final   Metapneumovirus NOT DETECTED NOT DETECTED Final   Rhinovirus / Enterovirus DETECTED (A) NOT DETECTED Final   Influenza A NOT DETECTED NOT DETECTED Final   Influenza B NOT DETECTED NOT DETECTED Final   Parainfluenza Virus 1 NOT DETECTED NOT DETECTED Final    Parainfluenza Virus 2 NOT DETECTED NOT DETECTED Final   Parainfluenza Virus 3 NOT DETECTED NOT DETECTED Final   Parainfluenza Virus 4 NOT DETECTED NOT DETECTED Final   Respiratory Syncytial Virus NOT DETECTED NOT DETECTED Final   Bordetella pertussis NOT DETECTED NOT DETECTED Final   Bordetella Parapertussis NOT DETECTED NOT DETECTED Final   Chlamydophila pneumoniae NOT DETECTED NOT DETECTED Final   Mycoplasma pneumoniae NOT DETECTED NOT DETECTED Final    Comment: Performed at [facility], [address], [place], [place] [address]         Radiology Studies: DG Chest Port 1 View Result Date: 04/02/2024 CLINICAL DATA:  Sepsis, cough, diaphoresis, body aches EXAM: PORTABLE CHEST 1 VIEW COMPARISON:  02/27/2023 FINDINGS: 2 frontal views of the chest demonstrate a stable cardiac silhouette. New right basilar airspace disease consistent with bronchopneumonia. No effusion or pneumothorax. Background emphysema. No acute bony abnormalities. IMPRESSION: 1. New right basilar airspace disease consistent with bronchopneumonia. 2. Background emphysema. Electronically Signed   By: Bobbye Burrow M.D.   On: 04/02/2024 09:37        Scheduled Meds:  arformoterol  15 mcg Nebulization BID   vitamin C   1,000 mg Oral Daily   aspirin  EC  81 mg Oral Daily   budesonide  (PULMICORT ) nebulizer solution  0.25 mg Nebulization BID   Chlorhexidine  Gluconate Cloth  6 each Topical Daily   clopidogrel   75 mg Oral Daily   guaiFENesin  1,200 mg Oral BID   heparin   5,000 Units Subcutaneous Q12H   ipratropium-albuterol   3 mL Nebulization Q4H   methylPREDNISolone  (SOLU-MEDROL ) injection  40 mg Intravenous BID   pantoprazole   40 mg Oral Daily   rosuvastatin   40 mg Oral [id number]   Continuous Infusions:  azithromycin  500 mg (04/03/24 0941)   cefTRIAXone (ROCEPHIN)  IV 2 g (04/03/24 0944)     LOS: 1 day   Tiajuana Fluke, MD [facility]   If 7PM-7AM, please contact night-coverage  04/03/2024,  11:01 AM

## 2024-04-04 DIAGNOSIS — J189 Pneumonia, unspecified organism: Secondary | ICD-10-CM | POA: Diagnosis not present

## 2024-04-04 LAB — CBC WITH DIFFERENTIAL/PLATELET
Abs Immature Granulocytes: 0.12 10*3/uL — ABNORMAL HIGH (ref 0.00–0.07)
Basophils Absolute: 0.1 10*3/uL (ref 0.0–0.1)
Basophils Relative: 0 %
Eosinophils Absolute: 0 10*3/uL (ref 0.0–0.5)
Eosinophils Relative: 0 %
HCT: 31.7 % — ABNORMAL LOW (ref 39.0–52.0)
Hemoglobin: 10.1 g/dL — ABNORMAL LOW (ref 13.0–17.0)
Immature Granulocytes: 1 %
Lymphocytes Relative: 4 %
Lymphs Abs: 0.5 10*3/uL — ABNORMAL LOW (ref 0.7–4.0)
MCH: 27.9 pg (ref 26.0–34.0)
MCHC: 31.9 g/dL (ref 30.0–36.0)
MCV: 87.6 fL (ref 80.0–100.0)
Monocytes Absolute: 0.4 10*3/uL (ref 0.1–1.0)
Monocytes Relative: 3 %
Neutro Abs: 10.5 10*3/uL — ABNORMAL HIGH (ref 1.7–7.7)
Neutrophils Relative %: 92 %
Platelets: 141 10*3/uL — ABNORMAL LOW (ref 150–400)
RBC: 3.62 MIL/uL — ABNORMAL LOW (ref 4.22–5.81)
RDW: 13.7 % (ref 11.5–15.5)
Smear Review: NORMAL
WBC: 11.5 10*3/uL — ABNORMAL HIGH (ref 4.0–10.5)
nRBC: 0 % (ref 0.0–0.2)

## 2024-04-04 LAB — BASIC METABOLIC PANEL WITH GFR
Anion gap: 10 (ref 5–15)
BUN: 34 mg/dL — ABNORMAL HIGH (ref 8–23)
CO2: 20 mmol/L — ABNORMAL LOW (ref 22–32)
Calcium: 8.6 mg/dL — ABNORMAL LOW (ref 8.9–10.3)
Chloride: 105 mmol/L (ref 98–111)
Creatinine, Ser: 1.2 mg/dL (ref 0.61–1.24)
GFR, Estimated: >60 mL/min (ref >60)
Glucose, Bld: 151 mg/dL — ABNORMAL HIGH (ref 70–99)
Potassium: 3.8 mmol/L (ref 3.5–5.1)
Sodium: 135 mmol/L (ref 135–145)

## 2024-04-04 MED ORDER — FLUTICASONE PROPIONATE 50 MCG/ACT NA SUSP: 1.0000 | NASAL | Status: DC | PRN

## 2024-04-04 MED ORDER — AMLODIPINE BESYLATE 10 MG PO TABS
10.0000 mg | ORAL_TABLET | Freq: Every day | ORAL | Status: DC
  Administered 2024-04-04 – 2024-04-06 (×3): 10 mg via ORAL
  Filled 2024-04-04 (×3): qty 1

## 2024-04-04 NOTE — Progress Notes (Signed)
 PROGRESS NOTE    Joe Dillon  ACZ:[id number] DOB: 1956/05/03 DOA: 04/02/2024 PCP: Yehuda Helms, MD    Brief Narrative:  68 y.o. male with medical history significant of COPD, CAD, HTN, chronic HFpEF, GERD, multiple OA's status post bilateral TKA, presented with worsening of cough wheezing shortness of breath and fever.   Symptoms started 2 days ago, patient started develop runny nose and sore throat, next day patient started to have productive cough with yellowish sputum and spiked a fever of 102 yesterday.  He has been taking OTC cough and fever medications with some relief.  Overnight however symptoms became worse and decided to come to the hospital.  Last 2 days he also has had significant decreased appetite and decreased oral intake including meals and fluid and have not seen significant decrease of urine output and darkened urine color.  But denies any dysuria no back pain.  Denied any nausea vomiting or diarrhea.  ED Course: Afebrile, tachycardia heart rate 103 tachypneic and blood pressure 80/50 improved to 96/60 after 3 L of IV bolus.  Ulceration 88% on room air and stabilized on 3 L.  Chest x-ray showed right middle lobe and right lower lobe infiltrates.  Blood work showed WBC 8.1 hemoglobin 12.7 K3.4 bicarb 21 BUN 31 creatinine 2.8 compared to baseline 0.9 lactic acid 5.7> 4.2 trending after 3 L IV bolus given.   Assessment & Plan:   Principal Problem:   CAP (community acquired pneumonia) Active Problems:   Chronic obstructive pulmonary disease (COPD) (HCC)   Sepsis (HCC)  Severe sepsis, with acute endorgan damage - Severe sepsis evidenced by new onset of hypoxia, hypotension, which required IV boluses, source of infection is multifocal pneumonia and signs of endorgan damage as AKI.  Sepsis physiology improved Plan: Continue Rocephin and azithromycin .  Encourage p.o. fluid intake.  Restart amlodipine    Acute hypoxic respiratory failure CAP, bacterial Rhinovirus  infection Acute COPD exacerbation - Secondary to combined effect of CAP as well as acute COPD exacerbation Respiratory status improving.  Remains on 2 L Plan: IV steroids Respiratory isolation Antibiotics as above Continue DuoNebs every 4 hours Continue twice daily Brovana, twice daily Pulmicort  Wean oxygen as tolerated, goal saturation 88 to 92%  AKI Likely prerenal secondary to sepsis Appears euvolemic Plan: Monitor   Hypokalemia Monitor replace as needed   HTN Chronic HFpEF Restart home Norvasc .  As needed labetalol .  Hold IV fluids.  CAD No active chest pain.  EKG without signs of ischemia.  Continue DAPT aspirin  and Plavix    DVT prophylaxis: SQ heparin  Code Status: Full Family Communication: None.  Call offered, patient declined Disposition Plan: Status is: Inpatient Remains inpatient appropriate because: Sepsis on IV antibiotics   Level of care: Telemetry Medical  Consultants:  None  Procedures:  None  Antimicrobials: Ceftriaxone Azithromycin    Subjective: Seen and examined resting in bed.  Still appears short of breath but improving over interval.  Objective: Vitals:   04/03/24 2053 04/04/24 0408 04/04/24 0728 04/04/24 0936  BP: (!) 148/82 (!) 140/82  132/72  Pulse: (!) 106 92  81  Resp: 20 20    Temp: 98.9 F (37.2 C) 98.2 F (36.8 C)  98.5 F (36.9 C)  TempSrc: Oral Oral  Oral  SpO2: 96% 97% 97% 98%  Weight:      Height:        Intake/Output Summary (Last 24 hours) at 04/04/2024 1116 Last data filed at 04/04/2024 1045 Gross per 24 hour  Intake 452.71 ml  Output 400 ml  Net 52.71 ml   Filed Weights   04/02/24 0905 04/02/24 1451  Weight: 88.5 kg 73.5 kg    Examination:  General exam: NAD Respiratory system: Bibasilar crackles.  Mild end expiratory wheeze.  Normal work of breathing.  2 L Cardiovascular system: S1-S2, RRR, no murmurs, no pedal edema Gastrointestinal system: Soft,/ND, normal bowel sounds Central nervous system:  Alert and oriented. No focal neurological deficits. Extremities: Symmetric 5 x 5 power. Skin: No rashes, lesions or ulcers Psychiatry: Judgement and insight appear normal. Mood & affect appropriate.     Data Reviewed: I have personally reviewed following labs and imaging studies  CBC: Recent Labs  Lab 04/02/24 0918 04/03/24 0518 04/04/24 0829  WBC 8.1 5.9 11.5*  NEUTROABS 5.7  --  10.5*  HGB 12.7* 10.1* 10.1*  HCT 39.6 31.6* 31.7*  MCV 88.6 88.0 87.6  PLT 185 110* 141*   Basic Metabolic Panel: Recent Labs  Lab 04/02/24 0918 04/03/24 0518 04/04/24 0829  NA 137 136 135  K 3.4* 4.4 3.8  CL 102 109 105  CO2 21* 23 20*  GLUCOSE 145* 152* 151*  BUN 31* 32* 34*  CREATININE 2.53* 1.56* 1.20  CALCIUM  8.7* 7.9* 8.6*   GFR: Estimated Creatinine Clearance: 60 mL/min (by C-G formula based on SCr of 1.2 mg/dL). Liver Function Tests: Recent Labs  Lab 04/02/24 0918  AST 38  ALT 23  ALKPHOS 61  BILITOT 0.5  PROT 6.8  ALBUMIN 3.6   No results for input(s): "LIPASE", "AMYLASE" in the last 168 hours. No results for input(s): "AMMONIA" in the last 168 hours. Coagulation Profile: Recent Labs  Lab 04/02/24 0918  INR 1.2   Cardiac Enzymes: No results for input(s): "CKTOTAL", "CKMB", "CKMBINDEX", "TROPONINI" in the last 168 hours. BNP (last 3 results) No results for input(s): "PROBNP" in the last 8760 hours. HbA1C: No results for input(s): "HGBA1C" in the last 72 hours. CBG: Recent Labs  Lab 04/02/24 1429  GLUCAP 107*   Lipid Profile: No results for input(s): "CHOL", "HDL", "LDLCALC", "TRIG", "CHOLHDL", "LDLDIRECT" in the last 72 hours. Thyroid  Function Tests: No results for input(s): "TSH", "T4TOTAL", "FREET4", "T3FREE", "THYROIDAB" in the last 72 hours. Anemia Panel: No results for input(s): "VITAMINB12", "FOLATE", "FERRITIN", "TIBC", "IRON", "RETICCTPCT" in the last 72 hours. Sepsis Labs: Recent Labs  Lab 04/02/24 6578 04/02/24 1117 04/02/24 1226  04/03/24 0849  LATICACIDVEN 5.7* 4.7* 5.0* 3.0*    Recent Results (from the past 240 hours)  Culture, blood (Routine x 2)     Status: None (Preliminary result)   Collection Time: 04/02/24  9:18 AM   Specimen: BLOOD  Result Value Ref Range Status   Specimen Description BLOOD LEFT [facility]  Final   Special Requests   Final    BOTTLES DRAWN AEROBIC AND ANAEROBIC Blood Culture adequate volume   Culture   Final    NO GROWTH 2 DAYS Performed at [facility], [address] [place], [place] [address]    Report Status PENDING  Incomplete  Culture, blood (Routine x 2)     Status: None (Preliminary result)   Collection Time: 04/02/24  9:35 AM   Specimen: BLOOD LEFT ARM  Result Value Ref Range Status   Specimen Description BLOOD LEFT ARM  Final   Special Requests   Final    BOTTLES DRAWN AEROBIC AND ANAEROBIC Blood Culture results may not be optimal due to an inadequate volume of blood received in culture bottles   Culture   Final  NO GROWTH 2 DAYS Performed at [facility], [address] [place], [place] [address]    Report Status PENDING  Incomplete  Resp panel by RT-PCR (RSV, Flu A&B, Covid) Anterior Nasal Swab     Status: None   Collection Time: 04/02/24 10:12 AM   Specimen: Anterior Nasal Swab  Result Value Ref Range Status   SARS Coronavirus 2 by RT PCR NEGATIVE NEGATIVE Final    Comment: (NOTE) SARS-CoV-2 target nucleic acids are NOT DETECTED.  The SARS-CoV-2 RNA is generally detectable in upper respiratory specimens during the acute phase of infection. The lowest concentration of SARS-CoV-2 viral copies this assay can detect is 138 copies/mL. A negative result does not preclude SARS-Cov-2 infection and should not be used as the sole basis for treatment or other patient management decisions. A negative result may occur with  improper specimen collection/handling, submission of specimen other than nasopharyngeal swab, presence of viral mutation(s) within  the areas targeted by this assay, and inadequate number of viral copies(<138 copies/mL). A negative result must be combined with clinical observations, patient history, and epidemiological information. The expected result is Negative.  Fact Sheet for Patients:  BloggerCourse.com  Fact Sheet for Healthcare Providers:  SeriousBroker.it  This test is no t yet approved or cleared by the [place]  FDA and  has been authorized for detection and/or diagnosis of SARS-CoV-2 by FDA under an Emergency Use Authorization (EUA). This EUA will remain  in effect (meaning this test can be used) for the duration of the COVID-19 declaration under Section 564(b)(1) of the Act, 21 U.S.C.section 360bbb-3(b)(1), unless the authorization is terminated  or revoked sooner.       Influenza A by PCR NEGATIVE NEGATIVE Final   Influenza B by PCR NEGATIVE NEGATIVE Final    Comment: (NOTE) The Xpert Xpress SARS-CoV-2/FLU/RSV plus assay is intended as an aid in the diagnosis of influenza from Nasopharyngeal swab specimens and should not be used as a sole basis for treatment. Nasal washings and aspirates are unacceptable for Xpert Xpress SARS-CoV-2/FLU/RSV testing.  Fact Sheet for Patients: BloggerCourse.com  Fact Sheet for Healthcare Providers: SeriousBroker.it  This test is not yet approved or cleared by the [place]  FDA and has been authorized for detection and/or diagnosis of SARS-CoV-2 by FDA under an Emergency Use Authorization (EUA). This EUA will remain in effect (meaning this test can be used) for the duration of the COVID-19 declaration under Section 564(b)(1) of the Act, 21 U.S.C. section 360bbb-3(b)(1), unless the authorization is terminated or revoked.     Resp Syncytial Virus by PCR NEGATIVE NEGATIVE Final    Comment: (NOTE) Fact Sheet for  Patients: BloggerCourse.com  Fact Sheet for Healthcare Providers: SeriousBroker.it  This test is not yet approved or cleared by the [place]  FDA and has been authorized for detection and/or diagnosis of SARS-CoV-2 by FDA under an Emergency Use Authorization (EUA). This EUA will remain in effect (meaning this test can be used) for the duration of the COVID-19 declaration under Section 564(b)(1) of the Act, 21 U.S.C. section 360bbb-3(b)(1), unless the authorization is terminated or revoked.  Performed at [facility], [address] [place], [place] [address]   Urine Culture     Status: Abnormal   Collection Time: 04/02/24 12:00 PM   Specimen: Urine, Random  Result Value Ref Range Status   Specimen Description   Final    URINE, RANDOM Performed at [facility], [address] [place], [place] [address]    Special Requests  Final    NONE Reflexed from [phone] Performed at [facility], [address] [place], [place] [address]    Culture (A)  Final    <10,000 COLONIES/mL INSIGNIFICANT GROWTH Performed at [facility], [address] [place], [place] [address]    Report Status 04/03/2024 FINAL  Final  MRSA Next Gen by PCR, Nasal     Status: None   Collection Time: 04/02/24  2:35 PM   Specimen: Nasal Mucosa; Nasal Swab  Result Value Ref Range Status   MRSA by PCR Next Gen NOT DETECTED NOT DETECTED Final    Comment: (NOTE) The GeneXpert MRSA Assay (FDA approved for NASAL specimens only), is one component of a comprehensive MRSA colonization surveillance program. It is not intended to diagnose MRSA infection nor to guide or monitor treatment for MRSA infections. Test performance is not FDA approved in patients less than 78 years old. Performed at [facility], [address] [place], [place] [address]   Respiratory (~20 pathogens) panel by PCR     Status: Abnormal    Collection Time: 04/02/24  3:03 PM   Specimen: Nasopharyngeal Swab; Respiratory  Result Value Ref Range Status   Adenovirus NOT DETECTED NOT DETECTED Final   Coronavirus 229E NOT DETECTED NOT DETECTED Final    Comment: (NOTE) The Coronavirus on the Respiratory Panel, DOES NOT test for the novel  Coronavirus (2019 nCoV)    Coronavirus HKU1 NOT DETECTED NOT DETECTED Final   Coronavirus NL63 NOT DETECTED NOT DETECTED Final   Coronavirus OC43 NOT DETECTED NOT DETECTED Final   Metapneumovirus NOT DETECTED NOT DETECTED Final   Rhinovirus / Enterovirus DETECTED (A) NOT DETECTED Final   Influenza A NOT DETECTED NOT DETECTED Final   Influenza B NOT DETECTED NOT DETECTED Final   Parainfluenza Virus 1 NOT DETECTED NOT DETECTED Final   Parainfluenza Virus 2 NOT DETECTED NOT DETECTED Final   Parainfluenza Virus 3 NOT DETECTED NOT DETECTED Final   Parainfluenza Virus 4 NOT DETECTED NOT DETECTED Final   Respiratory Syncytial Virus NOT DETECTED NOT DETECTED Final   Bordetella pertussis NOT DETECTED NOT DETECTED Final   Bordetella Parapertussis NOT DETECTED NOT DETECTED Final   Chlamydophila pneumoniae NOT DETECTED NOT DETECTED Final   Mycoplasma pneumoniae NOT DETECTED NOT DETECTED Final    Comment: Performed at [facility], [address] [place], [place] [address]         Radiology Studies: No results found.       Scheduled Meds:  arformoterol   15 mcg Nebulization BID   vitamin C   1,000 mg Oral Daily   aspirin  EC  81 mg Oral Daily   budesonide  (PULMICORT ) nebulizer solution  0.25 mg Nebulization BID   Chlorhexidine  Gluconate Cloth  6 each Topical Daily   clopidogrel   75 mg Oral Daily   guaiFENesin   1,200 mg Oral BID   heparin   5,000 Units Subcutaneous Q12H   ipratropium-albuterol   3 mL Nebulization TID   methylPREDNISolone  (SOLU-MEDROL ) injection  40 mg Intravenous BID   pantoprazole   40 mg Oral Daily   rosuvastatin   40 mg Oral [id number]   Continuous Infusions:   azithromycin  500 mg (04/04/24 0930)   cefTRIAXone  (ROCEPHIN )  IV 2 g (04/04/24 0931)     LOS: 2 days   Tiajuana Fluke, MD [facility]   If 7PM-7AM, please contact night-coverage  04/04/2024, 11:16 AM

## 2024-04-04 NOTE — Progress Notes (Signed)
 Occupational Therapy Treatment Patient Details Name: Joe Dillon MRN: [id number] DOB: [birth year] Today's Date: 04/04/2024   History of present illness 68 y.o. male presented with worsening of cough, wheezing, shortness of breath and fever. Admitted with severe sepsis, acute hypoxic respiratory failure, CAP, rhinovirus and acute COPD exacerbation. PMH of COPD, CAD, HTN, chronic HFpEF, GERD, multiple OA's status post bilateral TKA.   OT comments  Pt is supine in bed on arrival. Pleasant and agreeable to OT session. He denies pain. Pt performed bed mobility MOD I, STS with supervision and in room mobility to the bathroom and back with supervision and no AD use. Remains on 2L 02 throughout session and maintained between 89-93% and HR up to 105 with activity-much improved from yesterday. Able to perform standing oral care and grooming tasks with supervision, toilet transfer and toileting tasks with supervision to MOD I. Intermittent rest breaks provided. Pt left seated in recliner with all needs in place and will cont to require skilled acute OT services to maximize his safety and IND to return to PLOF.       If plan is discharge home, recommend the following:  A little help with walking and/or transfers;A little help with bathing/dressing/bathroom   Equipment Recommendations  None recommended by OT    Recommendations for Other Services      Precautions / Restrictions Precautions Precautions: None Precaution/Restrictions Comments: low fall Restrictions Weight Bearing Restrictions Per Provider Order: No       Mobility Bed Mobility Overal bed mobility: Modified Independent                  Transfers Overall transfer level: Modified independent                 General transfer comment: supervision for transfers and in room mobility without AD, no LOB; 2L throughout session and maintained 89-93% with activity and HR 105 at most     Balance Overall balance assessment:  Independent                                         ADL either performed or assessed with clinical judgement   ADL Overall ADL's : Needs assistance/impaired     Grooming: Wash/dry face;Oral care;Standing;Supervision/safety Grooming Details (indicate cue type and reason): at sink                 Toilet Transfer: Retail banker;Ambulation           Functional mobility during ADLs: Supervision/safety      Extremity/Trunk Assessment              Vision       Perception     Praxis     Communication Communication Communication: No apparent difficulties   Cognition Arousal: Alert Behavior During Therapy: WFL for tasks assessed/performed                                 Following commands: Intact        Cueing   Cueing Techniques: Verbal cues  Exercises      Shoulder Instructions       General Comments remains SOB with activity needing pacing and rest breaks    Pertinent Vitals/ Pain       Pain Assessment Pain Assessment: No/denies pain  Home Living  Prior Functioning/Environment              Frequency  Min 2X/week        Progress Toward Goals  OT Goals(current goals can now be found in the care plan section)  Progress towards OT goals: Progressing toward goals  Acute Rehab OT Goals Patient Stated Goal: go home OT Goal Formulation: With patient Time For Goal Achievement: 04/17/24 Potential to Achieve Goals: Good  Plan      Co-evaluation                 AM-PAC OT "6 Clicks" Daily Activity     Outcome Measure   Help from another person eating meals?: None Help from another person taking care of personal grooming?: None Help from another person toileting, which includes using toliet, bedpan, or urinal?: None Help from another person bathing (including washing, rinsing, drying)?: None Help from another person  to put on and taking off regular upper body clothing?: None Help from another person to put on and taking off regular lower body clothing?: None 6 Click Score: 24    End of Session Equipment Utilized During Treatment: Oxygen  OT Visit Diagnosis: Other abnormalities of gait and mobility (R26.89)   Activity Tolerance Patient tolerated treatment well;Other (comment)   Patient Left in chair;with chair alarm set;with call bell/phone within reach   Nurse Communication Mobility status        Time: 7829-5621 OT Time Calculation (min): 18 min  Charges: OT General Charges $OT Visit: 1 Visit OT Treatments $Self Care/Home Management : 8-22 mins  Charda Janis, OTR/L  04/04/24, 1:24 PM   Hazell Siwik E Martie Muhlbauer 04/04/2024, 1:22 PM

## 2024-04-04 NOTE — Progress Notes (Signed)
 Physical Therapy Treatment Patient Details Name: Joe Dillon MRN: [id number] DOB: Oct 29, 1956 Today's Date: 04/04/2024   History of Present Illness Pt is a 68 y.o. male presented with worsening cough, wheezing, shortness of breath and fever. Admitted with severe sepsis, acute hypoxic respiratory failure, AKI, hypokalemia, CAP, rhinovirus and acute COPD exacerbation. PMH of COPD, CAD, HTN, chronic HFpEF, GERD, multiple OA's status post bilateral TKA.    PT Comments  Pt was pleasant and motivated to participate during the session and put forth good effort throughout. Pt found on 2LO2/min with resting SpO2 96% and HR 96. Per nursing ok for trial wean. Pt able to amb on 2LO2/min with SpO2 dropping to a low of 92% with HR 97 bpm.  Pt's O2 reduced to 1LO2/min with resting SpO2 95% and HR 97.  During amb of 10 feet and then amb of 50 feet on 1L SpO2 dropped to a low of 88% with HR in the upper 90s to low 100s and with SpO2 quickly returning to the low 90s once pt returned to sitting.  Pt left on 1LO2/min at the end of the session with SpO2 93% and with nursing notified and in agreement.  Pt will benefit from continued PT services upon discharge to safely address deficits listed in patient problem list for decreased caregiver assistance and eventual return to PLOF.      If plan is discharge home, recommend the following: Help with stairs or ramp for entrance;Assist for transportation;A little help with walking and/or transfers;A little help with bathing/dressing/bathroom   Can travel by private vehicle        Equipment Recommendations  None recommended by PT    Recommendations for Other Services       Precautions / Restrictions Precautions Precautions: None Precaution/Restrictions Comments: low fall Restrictions Weight Bearing Restrictions Per Provider Order: No     Mobility  Bed Mobility Overal bed mobility: Modified Independent                  Transfers Overall transfer  level: Needs assistance Equipment used: None Transfers: Sit to/from Stand Sit to Stand: Supervision           General transfer comment: Good eccentric and concentric control and stability    Ambulation/Gait Ambulation/Gait assistance: Supervision Gait Distance (Feet): 10 Feet x 2, 50 Feet x 1 Assistive device: None Gait Pattern/deviations: Step-through pattern, Decreased step length - right, Decreased step length - left Gait velocity: decreased     General Gait Details: Mildly reduced cadence but steady with no overt LOB without an AD including during start/stops and 180 deg turns   Optometrist     Tilt Bed    Modified Rankin (Stroke Patients Only)       Balance Overall balance assessment: No apparent balance deficits (not formally assessed)                                          Communication Communication Communication: No apparent difficulties  Cognition Arousal: Alert Behavior During Therapy: WFL for tasks assessed/performed   PT - Cognitive impairments: No apparent impairments                         Following commands: Intact      Cueing Cueing Techniques: Verbal cues  Exercises      General Comments General comments (skin integrity, edema, etc.): remains SOB with activity needing pacing and rest breaks      Pertinent Vitals/Pain Pain Assessment Pain Assessment: No/denies pain    Home Living                          Prior Function            PT Goals (current goals can now be found in the care plan section) Progress towards PT goals: Progressing toward goals    Frequency    Min 3X/week      PT Plan      Co-evaluation              AM-PAC PT "6 Clicks" Mobility   Outcome Measure  Help needed turning from your back to your side while in a flat bed without using bedrails?: None Help needed moving from lying on your back to sitting on the side of a  flat bed without using bedrails?: None Help needed moving to and from a bed to a chair (including a wheelchair)?: A Little Help needed standing up from a chair using your arms (e.g., wheelchair or bedside chair)?: A Little Help needed to walk in hospital room?: A Little Help needed climbing 3-5 steps with a railing? : A Little 6 Click Score: 20    End of Session Equipment Utilized During Treatment: Oxygen;Gait belt Activity Tolerance: Patient tolerated treatment well Patient left: in bed;with call bell/phone within reach;Other (comment) (Pt found without bed alarm on and left as found, fall score 5) Nurse Communication: Mobility status;Other (comment) (SpO2 wean trial per above and that pt left on 1LO2/min at end of session) PT Visit Diagnosis: Other abnormalities of gait and mobility (R26.89);Muscle weakness (generalized) (M62.81);Difficulty in walking, not elsewhere classified (R26.2)     Time: 9147-8295 PT Time Calculation (min) (ACUTE ONLY): 22 min  Charges:    $Gait Training: 8-22 mins PT General Charges $$ ACUTE PT VISIT: 1 Visit                     D. Scott Jillane Po PT, DPT 04/04/24, 4:29 PM

## 2024-04-04 NOTE — Care Management Important Message (Signed)
 Important Message  Patient Details  Name: Joe Dillon MRN: [id number] Date of Birth: June 13, 1956   Important Message Given:  Yes - Medicare IM     Anise Kerns 04/04/2024, 1:11 PM

## 2024-04-04 NOTE — Plan of Care (Signed)

## 2024-04-05 DIAGNOSIS — J189 Pneumonia, unspecified organism: Secondary | ICD-10-CM | POA: Diagnosis not present

## 2024-04-05 MED ORDER — MELATONIN 5 MG PO TABS
5.0000 mg | ORAL_TABLET | Freq: Once | ORAL | Status: AC
  Administered 2024-04-05: 5 mg via ORAL
  Filled 2024-04-05: qty 1

## 2024-04-05 MED ORDER — IPRATROPIUM-ALBUTEROL 0.5-2.5 (3) MG/3ML IN SOLN
3.0000 mL | Freq: Two times a day (BID) | RESPIRATORY_TRACT | Status: DC
  Administered 2024-04-05 – 2024-04-06 (×2): 3 mL via RESPIRATORY_TRACT
  Filled 2024-04-05 (×2): qty 3

## 2024-04-05 MED ORDER — METHYLPREDNISOLONE SODIUM SUCC 40 MG IJ SOLR
40.0000 mg | Freq: Every day | INTRAMUSCULAR | Status: DC
  Administered 2024-04-06: 40 mg via INTRAVENOUS
  Filled 2024-04-05: qty 1

## 2024-04-05 NOTE — Progress Notes (Signed)
 PROGRESS NOTE    Joe Dillon  NWG:[id number] DOB: 11-15-1955 DOA: 04/02/2024 PCP: Yehuda Helms, MD    Brief Narrative:  68 y.o. male with medical history significant of COPD, CAD, HTN, chronic HFpEF, GERD, multiple OA's status post bilateral TKA, presented with worsening of cough wheezing shortness of breath and fever.   Symptoms started 2 days ago, patient started develop runny nose and sore throat, next day patient started to have productive cough with yellowish sputum and spiked a fever of 102 yesterday.  He has been taking OTC cough and fever medications with some relief.  Overnight however symptoms became worse and decided to come to the hospital.  Last 2 days he also has had significant decreased appetite and decreased oral intake including meals and fluid and have not seen significant decrease of urine output and darkened urine color.  But denies any dysuria no back pain.  Denied any nausea vomiting or diarrhea.  ED Course: Afebrile, tachycardia heart rate 103 tachypneic and blood pressure 80/50 improved to 96/60 after 3 L of IV bolus.  Ulceration 88% on room air and stabilized on 3 L.  Chest x-ray showed right middle lobe and right lower lobe infiltrates.  Blood work showed WBC 8.1 hemoglobin 12.7 K3.4 bicarb 21 BUN 31 creatinine 2.8 compared to baseline 0.9 lactic acid 5.7> 4.2 trending after 3 L IV bolus given.   Assessment & Plan:   Principal Problem:   CAP (community acquired pneumonia) Active Problems:   Chronic obstructive pulmonary disease (COPD) (HCC)   Sepsis (HCC)  Severe sepsis, with acute endorgan damage - Severe sepsis evidenced by new onset of hypoxia, hypotension, which required IV boluses, source of infection is multifocal pneumonia and signs of endorgan damage as AKI.  Sepsis physiology improved Plan: Continue course of Rocephin  and azithromycin .  Last dose should be 6/1.  Encourage p.o. fluid intake.  Monitor vitals and fever curve.   Acute hypoxic  respiratory failure CAP, bacterial Rhinovirus infection Acute COPD exacerbation - Secondary to combined effect of CAP as well as acute COPD exacerbation Respiratory status improving.  Remains on 2 L Plan: Wean IV steroids.  Antibiotics as above.  Wean scheduled nebs.  Continue Brovana  and Pulmicort  twice daily.  Continue respiratory isolation.  Wean oxygen as tolerated.  Goal saturation 88 to 92%  AKI Likely prerenal secondary to sepsis Appears euvolemic Plan: No IV fluids.  Continue to monitor   Hypokalemia Continue to monitor and replace as needed   HTN Chronic HFpEF Continue home Norvasc .  As needed labetalol .  Hold IV fluids.  CAD No active chest pain.  EKG without signs of ischemia.  Continue DAPT aspirin  and Plavix    DVT prophylaxis: SQ heparin  Code Status: Full Family Communication: None.  Call offered, patient declined Disposition Plan: Status is: Inpatient Remains inpatient appropriate because: Sepsis on IV antibiotics   Level of care: Telemetry Medical  Consultants:  None  Procedures:  None  Antimicrobials: Ceftriaxone  Azithromycin    Subjective: Seen and examined.  Resting in bed.  Shortness of breath improving. Objective: Vitals:   04/04/24 2048 04/05/24 0449 04/05/24 0726 04/05/24 0823  BP: (!) 143/80 (!) 141/88  (!) 145/87  Pulse: 88 85  86  Resp: 18 18  18   Temp: 98.3 F (36.8 C) 98.2 F (36.8 C)  98.4 F (36.9 C)  TempSrc: Oral Oral  Oral  SpO2: 96% 97% 91% 92%  Weight:      Height:        Intake/Output Summary (Last  24 hours) at 04/05/2024 1104 Last data filed at 04/05/2024 0847 Gross per 24 hour  Intake 240 ml  Output 750 ml  Net -510 ml   Filed Weights   04/02/24 0905 04/02/24 1451  Weight: 88.5 kg 73.5 kg    Examination:  General exam: No acute distress Respiratory system: End expiratory wheeze.  No crackles.  Normal work of breathing.  Room air Cardiovascular system: S1-S2, RRR, no murmurs, no pedal  edema Gastrointestinal system: Soft, NT/ND, normal bowel sounds Central nervous system: Alert and oriented. No focal neurological deficits. Extremities: Symmetric 5 x 5 power. Skin: No rashes, lesions or ulcers Psychiatry: Judgement and insight appear normal. Mood & affect appropriate.     Data Reviewed: I have personally reviewed following labs and imaging studies  CBC: Recent Labs  Lab 04/02/24 0918 04/03/24 0518 04/04/24 0829  WBC 8.1 5.9 11.5*  NEUTROABS 5.7  --  10.5*  HGB 12.7* 10.1* 10.1*  HCT 39.6 31.6* 31.7*  MCV 88.6 88.0 87.6  PLT 185 110* 141*   Basic Metabolic Panel: Recent Labs  Lab 04/02/24 0918 04/03/24 0518 04/04/24 0829  NA 137 136 135  K 3.4* 4.4 3.8  CL 102 109 105  CO2 21* 23 20*  GLUCOSE 145* 152* 151*  BUN 31* 32* 34*  CREATININE 2.53* 1.56* 1.20  CALCIUM  8.7* 7.9* 8.6*   GFR: Estimated Creatinine Clearance: 60 mL/min (by C-G formula based on SCr of 1.2 mg/dL). Liver Function Tests: Recent Labs  Lab 04/02/24 0918  AST 38  ALT 23  ALKPHOS 61  BILITOT 0.5  PROT 6.8  ALBUMIN 3.6   No results for input(s): "LIPASE", "AMYLASE" in the last 168 hours. No results for input(s): "AMMONIA" in the last 168 hours. Coagulation Profile: Recent Labs  Lab 04/02/24 0918  INR 1.2   Cardiac Enzymes: No results for input(s): "CKTOTAL", "CKMB", "CKMBINDEX", "TROPONINI" in the last 168 hours. BNP (last 3 results) No results for input(s): "PROBNP" in the last 8760 hours. HbA1C: No results for input(s): "HGBA1C" in the last 72 hours. CBG: Recent Labs  Lab 04/02/24 1429  GLUCAP 107*   Lipid Profile: No results for input(s): "CHOL", "HDL", "LDLCALC", "TRIG", "CHOLHDL", "LDLDIRECT" in the last 72 hours. Thyroid  Function Tests: No results for input(s): "TSH", "T4TOTAL", "FREET4", "T3FREE", "THYROIDAB" in the last 72 hours. Anemia Panel: No results for input(s): "VITAMINB12", "FOLATE", "FERRITIN", "TIBC", "IRON", "RETICCTPCT" in the last 72  hours. Sepsis Labs: Recent Labs  Lab 04/02/24 1610 04/02/24 1117 04/02/24 1226 04/03/24 0849  LATICACIDVEN 5.7* 4.7* 5.0* 3.0*    Recent Results (from the past 240 hours)  Culture, blood (Routine x 2)     Status: None (Preliminary result)   Collection Time: 04/02/24  9:18 AM   Specimen: BLOOD  Result Value Ref Range Status   Specimen Description BLOOD LEFT [facility]  Final   Special Requests   Final    BOTTLES DRAWN AEROBIC AND ANAEROBIC Blood Culture adequate volume   Culture   Final    NO GROWTH 2 DAYS Performed at [facility], [address], [place], [place] [address]    Report Status PENDING  Incomplete  Culture, blood (Routine x 2)     Status: None (Preliminary result)   Collection Time: 04/02/24  9:35 AM   Specimen: BLOOD LEFT ARM  Result Value Ref Range Status   Specimen Description BLOOD LEFT ARM  Final   Special Requests   Final    BOTTLES DRAWN AEROBIC AND ANAEROBIC Blood Culture results  may not be optimal due to an inadequate volume of blood received in culture bottles   Culture   Final    NO GROWTH 2 DAYS Performed at [facility], [address] [place], [place] [address]    Report Status PENDING  Incomplete  Resp panel by RT-PCR (RSV, Flu A&B, Covid) Anterior Nasal Swab     Status: None   Collection Time: 04/02/24 10:12 AM   Specimen: Anterior Nasal Swab  Result Value Ref Range Status   SARS Coronavirus 2 by RT PCR NEGATIVE NEGATIVE Final    Comment: (NOTE) SARS-CoV-2 target nucleic acids are NOT DETECTED.  The SARS-CoV-2 RNA is generally detectable in upper respiratory specimens during the acute phase of infection. The lowest concentration of SARS-CoV-2 viral copies this assay can detect is 138 copies/mL. A negative result does not preclude SARS-Cov-2 infection and should not be used as the sole basis for treatment or other patient management decisions. A negative result may occur with  improper specimen collection/handling, submission  of specimen other than nasopharyngeal swab, presence of viral mutation(s) within the areas targeted by this assay, and inadequate number of viral copies(<138 copies/mL). A negative result must be combined with clinical observations, patient history, and epidemiological information. The expected result is Negative.  Fact Sheet for Patients:  BloggerCourse.com  Fact Sheet for Healthcare Providers:  SeriousBroker.it  This test is no t yet approved or cleared by the [place]  FDA and  has been authorized for detection and/or diagnosis of SARS-CoV-2 by FDA under an Emergency Use Authorization (EUA). This EUA will remain  in effect (meaning this test can be used) for the duration of the COVID-19 declaration under Section 564(b)(1) of the Act, 21 U.S.C.section 360bbb-3(b)(1), unless the authorization is terminated  or revoked sooner.       Influenza A by PCR NEGATIVE NEGATIVE Final   Influenza B by PCR NEGATIVE NEGATIVE Final    Comment: (NOTE) The Xpert Xpress SARS-CoV-2/FLU/RSV plus assay is intended as an aid in the diagnosis of influenza from Nasopharyngeal swab specimens and should not be used as a sole basis for treatment. Nasal washings and aspirates are unacceptable for Xpert Xpress SARS-CoV-2/FLU/RSV testing.  Fact Sheet for Patients: BloggerCourse.com  Fact Sheet for Healthcare Providers: SeriousBroker.it  This test is not yet approved or cleared by the [place]  FDA and has been authorized for detection and/or diagnosis of SARS-CoV-2 by FDA under an Emergency Use Authorization (EUA). This EUA will remain in effect (meaning this test can be used) for the duration of the COVID-19 declaration under Section 564(b)(1) of the Act, 21 U.S.C. section 360bbb-3(b)(1), unless the authorization is terminated or revoked.     Resp Syncytial Virus by PCR NEGATIVE NEGATIVE  Final    Comment: (NOTE) Fact Sheet for Patients: BloggerCourse.com  Fact Sheet for Healthcare Providers: SeriousBroker.it  This test is not yet approved or cleared by the [place]  FDA and has been authorized for detection and/or diagnosis of SARS-CoV-2 by FDA under an Emergency Use Authorization (EUA). This EUA will remain in effect (meaning this test can be used) for the duration of the COVID-19 declaration under Section 564(b)(1) of the Act, 21 U.S.C. section 360bbb-3(b)(1), unless the authorization is terminated or revoked.  Performed at [facility], [address], [place], [place] [address]   Urine Culture     Status: Abnormal   Collection Time: 04/02/24 12:00 PM   Specimen: Urine, Random  Result Value Ref Range Status   Specimen Description  Final    URINE, RANDOM Performed at [facility], [address] [place], [place] [address]    Special Requests   Final    NONE Reflexed from [id number] Performed at [facility], [address] [place], [place] [address]    Culture (A)  Final    <10,000 COLONIES/mL INSIGNIFICANT GROWTH Performed at [facility], [address], [place], [place] [address]    Report Status 04/03/2024 FINAL  Final  MRSA Next Gen by PCR, Nasal     Status: None   Collection Time: 04/02/24  2:35 PM   Specimen: Nasal Mucosa; Nasal Swab  Result Value Ref Range Status   MRSA by PCR Next Gen NOT DETECTED NOT DETECTED Final    Comment: (NOTE) The GeneXpert MRSA Assay (FDA approved for NASAL specimens only), is one component of a comprehensive MRSA colonization surveillance program. It is not intended to diagnose MRSA infection nor to guide or monitor treatment for MRSA infections. Test performance is not FDA approved in patients less than 64 years old. Performed at [facility], [address] [place], [place] [address]   Respiratory (~20 pathogens)  panel by PCR     Status: Abnormal   Collection Time: 04/02/24  3:03 PM   Specimen: Nasopharyngeal Swab; Respiratory  Result Value Ref Range Status   Adenovirus NOT DETECTED NOT DETECTED Final   Coronavirus 229E NOT DETECTED NOT DETECTED Final    Comment: (NOTE) The Coronavirus on the Respiratory Panel, DOES NOT test for the novel  Coronavirus (2019 nCoV)    Coronavirus HKU1 NOT DETECTED NOT DETECTED Final   Coronavirus NL63 NOT DETECTED NOT DETECTED Final   Coronavirus OC43 NOT DETECTED NOT DETECTED Final   Metapneumovirus NOT DETECTED NOT DETECTED Final   Rhinovirus / Enterovirus DETECTED (A) NOT DETECTED Final   Influenza A NOT DETECTED NOT DETECTED Final   Influenza B NOT DETECTED NOT DETECTED Final   Parainfluenza Virus 1 NOT DETECTED NOT DETECTED Final   Parainfluenza Virus 2 NOT DETECTED NOT DETECTED Final   Parainfluenza Virus 3 NOT DETECTED NOT DETECTED Final   Parainfluenza Virus 4 NOT DETECTED NOT DETECTED Final   Respiratory Syncytial Virus NOT DETECTED NOT DETECTED Final   Bordetella pertussis NOT DETECTED NOT DETECTED Final   Bordetella Parapertussis NOT DETECTED NOT DETECTED Final   Chlamydophila pneumoniae NOT DETECTED NOT DETECTED Final   Mycoplasma pneumoniae NOT DETECTED NOT DETECTED Final    Comment: Performed at [facility], [address] [place], [place] [address]         Radiology Studies: No results found.       Scheduled Meds:  amLODipine   10 mg Oral Daily   arformoterol   15 mcg Nebulization BID   vitamin C   1,000 mg Oral Daily   aspirin  EC  81 mg Oral Daily   budesonide  (PULMICORT ) nebulizer solution  0.25 mg Nebulization BID   Chlorhexidine  Gluconate Cloth  6 each Topical Daily   clopidogrel   75 mg Oral Daily   guaiFENesin   1,200 mg Oral BID   heparin   5,000 Units Subcutaneous Q12H   ipratropium-albuterol   3 mL Nebulization BID   [START ON 04/06/2024] methylPREDNISolone  (SOLU-MEDROL ) injection  40 mg Intravenous Daily    pantoprazole   40 mg Oral Daily   rosuvastatin   40 mg Oral [id number]   Continuous Infusions:  cefTRIAXone  (ROCEPHIN )  IV 2 g (04/05/24 0854)     LOS: 3 days   Tiajuana Fluke, MD [facility]  If 7PM-7AM, please contact night-coverage  04/05/2024, 11:04 AM

## 2024-04-05 NOTE — Progress Notes (Signed)
 Mobility Specialist - Progress Note   04/05/24 1043  Mobility  Activity Stood at bedside;Dangled on edge of bed;Ambulated independently in hallway  Level of Assistance Independent  Assistive Device None  Distance Ambulated (ft) 120 ft  Activity Response Tolerated well  Mobility Referral Yes  Mobility visit 1 Mobility  Mobility Specialist Start Time (ACUTE ONLY) 1010  Mobility Specialist Stop Time (ACUTE ONLY) 1031  Mobility Specialist Time Calculation (min) (ACUTE ONLY) 21 min   Pt supine in bed on RA upon arrival. Pt completes bed mobility, STS, and ambulates in hallway indep. Pt does desat to 82 on RA, RN notified. Pt left in bed on 2L, with needs in reach.   Wash Hack  Mobility Specialist  04/05/24 10:44 AM

## 2024-04-05 NOTE — Plan of Care (Signed)

## 2024-04-06 DIAGNOSIS — J189 Pneumonia, unspecified organism: Secondary | ICD-10-CM | POA: Diagnosis not present

## 2024-04-06 LAB — CBC WITH DIFFERENTIAL/PLATELET
Abs Immature Granulocytes: 0.99 10*3/uL — ABNORMAL HIGH (ref 0.00–0.07)
Basophils Absolute: 0.1 10*3/uL (ref 0.0–0.1)
Basophils Relative: 1 %
Eosinophils Absolute: 0 10*3/uL (ref 0.0–0.5)
Eosinophils Relative: 0 %
HCT: 34.7 % — ABNORMAL LOW (ref 39.0–52.0)
Hemoglobin: 11.3 g/dL — ABNORMAL LOW (ref 13.0–17.0)
Immature Granulocytes: 7 %
Lymphocytes Relative: 8 %
Lymphs Abs: 1.2 10*3/uL (ref 0.7–4.0)
MCH: 28.4 pg (ref 26.0–34.0)
MCHC: 32.6 g/dL (ref 30.0–36.0)
MCV: 87.2 fL (ref 80.0–100.0)
Monocytes Absolute: 1.3 10*3/uL — ABNORMAL HIGH (ref 0.1–1.0)
Monocytes Relative: 9 %
Neutro Abs: 10.9 10*3/uL — ABNORMAL HIGH (ref 1.7–7.7)
Neutrophils Relative %: 75 %
Platelets: 202 10*3/uL (ref 150–400)
RBC: 3.98 MIL/uL — ABNORMAL LOW (ref 4.22–5.81)
RDW: 14 % (ref 11.5–15.5)
Smear Review: NORMAL
WBC: 14.5 10*3/uL — ABNORMAL HIGH (ref 4.0–10.5)
nRBC: 0.3 % — ABNORMAL HIGH (ref 0.0–0.2)

## 2024-04-06 LAB — BASIC METABOLIC PANEL WITH GFR
Anion gap: 8 (ref 5–15)
BUN: 39 mg/dL — ABNORMAL HIGH (ref 8–23)
CO2: 25 mmol/L (ref 22–32)
Calcium: 8.3 mg/dL — ABNORMAL LOW (ref 8.9–10.3)
Chloride: 106 mmol/L (ref 98–111)
Creatinine, Ser: 1.14 mg/dL (ref 0.61–1.24)
GFR, Estimated: >60 mL/min (ref >60)
Glucose, Bld: 102 mg/dL — ABNORMAL HIGH (ref 70–99)
Potassium: 4 mmol/L (ref 3.5–5.1)
Sodium: 139 mmol/L (ref 135–145)

## 2024-04-06 MED ORDER — PREDNISONE 20 MG PO TABS: 40.0000 mg | ORAL_TABLET | Freq: Every day | ORAL | 0 refills | Status: AC

## 2024-04-06 NOTE — TOC Initial Note (Signed)
 Transition of Care [facility]) - Initial/Assessment Note    Patient Details  Name: Joe Dillon MRN: [id number] Date of Birth: 05-May-1956  Transition of Care [facility]) CM/SW Contact:    Loman Risk, RN Phone Number: 04/06/2024, 10:34 AM  Clinical Narrative:                  Admitted for: sepsis Admitted from: home with wife PCP: [facility] Current home health/prior home health/DME: [facility]W  Patient to discharge today with new O2 Referral made to [employer] for Home o2 and portable to be delivered to room Patient agreeable to home health. Patient states that he does not have a preference of agency.  Referral made to [place]  with [facility]         Patient Goals and CMS Choice            Expected Discharge Plan and Services         Expected Discharge Date: 04/06/24                                    Prior Living Arrangements/Services                       Activities of Daily Living   ADL Screening (condition at time of admission) Independently performs ADLs?: Yes (appropriate for developmental age) Is the patient deaf or have difficulty hearing?: No Does the patient have difficulty seeing, even when wearing glasses/contacts?: No Does the patient have difficulty concentrating, remembering, or making decisions?: No  Permission Sought/Granted                  Emotional Assessment              Admission diagnosis:  CAP (community acquired pneumonia) [J18.9] Sepsis (HCC) [A41.9] Community acquired pneumonia of right lower lobe of lung [J18.9] Acute hypoxic respiratory failure (HCC) [J96.01] Patient Active Problem List   Diagnosis Date Noted   Sepsis (HCC) 04/02/2024   CAP (community acquired pneumonia) 04/02/2024   NSTEMI (non-ST elevated myocardial infarction) (HCC) 11/17/2023   Chronic obstructive pulmonary disease (COPD) (HCC) 11/17/2023   GERD without esophagitis 11/17/2023   Coronary artery disease 11/17/2023   History of total knee  arthroplasty, left 11/16/2023   Status post total right knee replacement 08/10/2023   Coronary artery disease of native artery of native heart with stable angina pectoris (HCC) 06/28/2023   Primary osteoarthritis of left knee 06/11/2023   SIRS (systemic inflammatory response syndrome) (HCC) 12/01/2022   Elevated troponin I level 12/01/2022   Essential hypertension 12/01/2022   Acute exacerbation of chronic obstructive pulmonary disease (COPD) (HCC) 12/01/2022   H/O Salmonella infection 12/01/2022   Influenza A 11/30/2022   Cervical radiculopathy 11/23/2021   Neck pain 11/23/2021   DDD (degenerative disc disease), cervical 11/21/2021   Panlobular emphysema (HCC) 11/21/2021   Localized primary osteoarthrosis of carpometacarpal joint of right wrist 11/02/2016   Mixed simple and mucopurulent chronic bronchitis (HCC) 10/28/2015   Chronic cough 05/25/2015   Backhand tennis elbow 05/25/2015   Snores 05/25/2015   ST segment depression 05/25/2015   Synovitis and tenosynovitis 05/25/2015   Tenosynovitis of hand 05/25/2015   Irregular cardiac rhythm 12/16/2014   Abnormal ECG 09/11/2014   Benign essential HTN 09/11/2014   Chest pain 09/11/2014   Fatigue 09/11/2014   Combined fat and carbohydrate induced hyperlipemia 09/11/2014   Breathlessness on exertion 09/11/2014   Current  tobacco use 09/11/2014   Thrombosed hemorrhoids 05/16/2010   Blood in feces 02/24/2010   PCP:  Yehuda Helms, MD Pharmacy:   [facility] [id number] [place], [place] - [address] [place] [address] Phone: [phone] Fax: [phone]  [phone] [place], [place] - [address] [address] Phone: [phone] Fax: [phone]     Social Drivers of Health (SDOH) Social History: SDOH Screenings   Food Insecurity: No Food Insecurity (04/02/2024)  Housing: Low Risk  (04/02/2024)  Transportation Needs: No Transportation Needs  (04/02/2024)  Utilities: Not At Risk (04/02/2024)  Alcohol Screen: Low Risk  (09/20/2021)  Depression (PHQ2-9): Low Risk  (09/20/2021)  Financial Resource Strain: Low Risk  (12/22/2020)  Physical Activity: Inactive (12/22/2020)  Social Connections: Moderately Integrated (04/02/2024)  Stress: No Stress Concern Present (12/22/2020)  Tobacco Use: High Risk (04/02/2024)   SDOH Interventions:     Readmission Risk Interventions     No data to display

## 2024-04-06 NOTE — Discharge Summary (Signed)
 Physician Discharge Summary  Joe Dillon BJY:[id number] DOB: August 19, 1956 DOA: 04/02/2024  PCP: Yehuda Helms, MD  Admit date: 04/02/2024 Discharge date: 04/06/2024  Admitted From: Home Disposition:  Home w home health  Recommendations for Outpatient Follow-up:  Follow up with PCP in 1-2 weeks   Home Health:Yes PT RN  Equipment/Devices:Oxygen 3L via [place]   Discharge Condition:Stable  CODE STATUS:FULL  Diet recommendation: Reg  Brief/Interim Summary: 68 y.o. male with medical history significant of COPD, CAD, HTN, chronic HFpEF, GERD, multiple OA's status post bilateral TKA, presented with worsening of cough wheezing shortness of breath and fever.   Symptoms started 2 days ago, patient started develop runny nose and sore throat, next day patient started to have productive cough with yellowish sputum and spiked a fever of 102 yesterday.  He has been taking OTC cough and fever medications with some relief.  Overnight however symptoms became worse and decided to come to the hospital.  Last 2 days he also has had significant decreased appetite and decreased oral intake including meals and fluid and have not seen significant decrease of urine output and darkened urine color.  But denies any dysuria no back pain.  Denied any nausea vomiting or diarrhea.   ED Course: Afebrile, tachycardia heart rate 103 tachypneic and blood pressure 80/50 improved to 96/60 after 3 L of IV bolus.  Ulceration 88% on room air and stabilized on 3 L.  Chest x-ray showed right middle lobe and right lower lobe infiltrates.  Blood work showed WBC 8.1 hemoglobin 12.7 K3.4 bicarb 21 BUN 31 creatinine 2.8 compared to baseline 0.9 lactic acid 5.7> 4.2 trending after 3 L IV bolus given.      Discharge Diagnoses:  Principal Problem:   CAP (community acquired pneumonia) Active Problems:   Chronic obstructive pulmonary disease (COPD) (HCC)   Sepsis (HCC)  Severe sepsis, with acute endorgan damage - Severe sepsis  evidenced by new onset of hypoxia, hypotension, which required IV boluses, source of infection is multifocal pneumonia and signs of endorgan damage as AKI.  Sepsis physiology improved Plan: Completed course of Rocephin  and azithromycin .  Sepsis physiology resolved at time of discharge.   Acute hypoxic respiratory failure CAP, bacterial Rhinovirus infection Acute COPD exacerbation - Secondary to combined effect of CAP as well as acute COPD exacerbation Respiratory status improving.  Remains on 2 L Plan: IV steroids weaned off.  P.o. prednisone  40 mg daily x 5 additional days.  Resume home bronchodilator regimen.  Completed antibiotics.  Was ambulated at time of discharge and found to have symptomatic desaturation.  Oxygen 3 L via nasal cannula ordered at time of discharge.   Discharge Instructions  Discharge Instructions     Diet - low sodium heart healthy   Complete by: As directed    Increase activity slowly   Complete by: As directed       Allergies as of 04/06/2024   No Known Allergies      Medication List     STOP taking these medications    amoxicillin 500 MG capsule Commonly known as: AMOXIL   isosorbide  mononitrate 30 MG 24 hr tablet Commonly known as: IMDUR    metoprolol  tartrate 50 MG tablet Commonly known as: LOPRESSOR    oxyCODONE  5 MG immediate release tablet Commonly known as: Roxicodone    ticagrelor  90 MG Tabs tablet Commonly known as: BRILINTA    traMADol  50 MG tablet Commonly known as: Ultram        TAKE these medications    acetaminophen  650 MG  CR tablet Commonly known as: TYLENOL  Take 1,300 mg by mouth every 8 (eight) hours as needed for pain.   albuterol  (2.5 MG/3ML) 0.083% nebulizer solution Commonly known as: PROVENTIL  Inhale 3 mLs (2.5 mg total) into the lungs every 6 (six) hours as needed for wheezing or shortness of breath.   albuterol  108 (90 Base) MCG/ACT inhaler Commonly known as: VENTOLIN  HFA Inhale 2 puffs into the lungs every  4 (four) hours as needed for wheezing.   amLODipine  10 MG tablet Commonly known as: NORVASC  Take 1 tablet (10 mg total) by mouth daily.   amLODipine  10 MG tablet Commonly known as: NORVASC  Take 1 tablet by mouth daily.   aspirin  EC 81 MG tablet Take 81 mg by mouth daily.   clopidogrel  75 MG tablet Commonly known as: PLAVIX  Take 75 mg by mouth daily.   esomeprazole 20 MG capsule Commonly known as: NEXIUM Take 20 mg by mouth every morning.   FE C PO Take 65 mg by mouth 2 (two) times daily.   ferrous sulfate  325 (65 FE) MG tablet Take 1 tablet (325 mg total) by mouth 2 (two) times daily with a meal.   fluticasone  50 MCG/ACT nasal spray Commonly known as: FLONASE  Place 1 spray into both nostrils as needed for rhinitis.   losartan -hydrochlorothiazide  100-12.5 MG tablet Commonly known as: HYZAAR Take 1 tablet by mouth every morning.   meloxicam  15 MG tablet Commonly known as: MOBIC  Take 1 tablet (15 mg total) by mouth every morning.   pantoprazole  40 MG tablet Commonly known as: PROTONIX  Take 40 mg by mouth daily.   predniSONE  20 MG tablet Commonly known as: DELTASONE  Take 2 tablets (40 mg total) by mouth daily for 5 days.   rosuvastatin  40 MG tablet Commonly known as: CRESTOR  Take 40 mg by mouth every morning.   Trelegy Ellipta  100-62.5-25 MCG/ACT Aepb Generic drug: Fluticasone -Umeclidin-Vilant INHALE ONE PUFF BY MOUTH DAILY   Trelegy Ellipta  100-62.5-25 MCG/ACT Aepb Generic drug: Fluticasone -Umeclidin-Vilant Inhale 1 puff into the lungs daily.   vitamin C  1000 MG tablet Take 1,000 mg by mouth daily.               Durable Medical Equipment  (From admission, onward)           Start     Ordered   04/06/24 1022  For home use only DME oxygen  Once       Question Answer Comment  Length of Need 6 Months   Mode or (Route) Nasal cannula   Liters per Minute 3   Frequency Continuous (stationary and portable oxygen unit needed)   Oxygen conserving  device Yes   Oxygen delivery system Gas      04/06/24 1021            No Known Allergies  Consultations: None   Procedures/Studies: DG Chest Port 1 View Result Date: 04/02/2024 CLINICAL DATA:  Sepsis, cough, diaphoresis, body aches EXAM: PORTABLE CHEST 1 VIEW COMPARISON:  02/27/2023 FINDINGS: 2 frontal views of the chest demonstrate a stable cardiac silhouette. New right basilar airspace disease consistent with bronchopneumonia. No effusion or pneumothorax. Background emphysema. No acute bony abnormalities. IMPRESSION: 1. New right basilar airspace disease consistent with bronchopneumonia. 2. Background emphysema. Electronically Signed   By: Bobbye Burrow M.D.   On: 04/02/2024 09:37      Subjective: Seen and examined on the day of discharge.  Stable no distress.  Appropriate for discharge home.  Discharge Exam: Vitals:   04/06/24 1610 04/06/24 9604  BP:  (!) 141/82  Pulse:  74  Resp:  18  Temp:  98.2 F (36.8 C)  SpO2: 95% 98%   Vitals:   04/06/24 0500 04/06/24 0523 04/06/24 0726 04/06/24 0741  BP:  110/77  (!) 141/82  Pulse:  78  74  Resp:  18  18  Temp: 98 F (36.7 C) 98 F (36.7 C)  98.2 F (36.8 C)  TempSrc:    Oral  SpO2:  95% 95% 98%  Weight:      Height:        General: Pt is alert, awake, not in acute distress Cardiovascular: RRR, S1/S2 +, no rubs, no gallops Respiratory: CTA bilaterally, no wheezing, no rhonchi Abdominal: Soft, NT, ND, bowel sounds + Extremities: no edema, no cyanosis    The results of significant diagnostics from this hospitalization (including imaging, microbiology, ancillary and laboratory) are listed below for reference.     Microbiology: Recent Results (from the past 240 hours)  Culture, blood (Routine x 2)     Status: None (Preliminary result)   Collection Time: 04/02/24  9:18 AM   Specimen: BLOOD  Result Value Ref Range Status   Specimen Description BLOOD LEFT [facility]  Final   Special Requests   Final    BOTTLES DRAWN  AEROBIC AND ANAEROBIC Blood Culture adequate volume   Culture   Final    NO GROWTH 4 DAYS Performed at [facility], [address] [place], [place] [address]    Report Status PENDING  Incomplete  Culture, blood (Routine x 2)     Status: None (Preliminary result)   Collection Time: 04/02/24  9:35 AM   Specimen: BLOOD LEFT ARM  Result Value Ref Range Status   Specimen Description BLOOD LEFT ARM  Final   Special Requests   Final    BOTTLES DRAWN AEROBIC AND ANAEROBIC Blood Culture results may not be optimal due to an inadequate volume of blood received in culture bottles   Culture   Final    NO GROWTH 4 DAYS Performed at [facility], [address], [place], [place] [address]    Report Status PENDING  Incomplete  Resp panel by RT-PCR (RSV, Flu A&B, Covid) Anterior Nasal Swab     Status: None   Collection Time: 04/02/24 10:12 AM   Specimen: Anterior Nasal Swab  Result Value Ref Range Status   SARS Coronavirus 2 by RT PCR NEGATIVE NEGATIVE Final    Comment: (NOTE) SARS-CoV-2 target nucleic acids are NOT DETECTED.  The SARS-CoV-2 RNA is generally detectable in upper respiratory specimens during the acute phase of infection. The lowest concentration of SARS-CoV-2 viral copies this assay can detect is 138 copies/mL. A negative result does not preclude SARS-Cov-2 infection and should not be used as the sole basis for treatment or other patient management decisions. A negative result may occur with  improper specimen collection/handling, submission of specimen other than nasopharyngeal swab, presence of viral mutation(s) within the areas targeted by this assay, and inadequate number of viral copies(<138 copies/mL). A negative result must be combined with clinical observations, patient history, and epidemiological information. The expected result is Negative.  Fact Sheet for Patients:  BloggerCourse.com  Fact Sheet for Healthcare  Providers:  SeriousBroker.it  This test is no t yet approved or cleared by the [place]  FDA and  has been authorized for detection and/or diagnosis of SARS-CoV-2 by FDA under an Emergency Use Authorization (EUA). This EUA will remain  in effect (meaning this test can  be used) for the duration of the COVID-19 declaration under Section 564(b)(1) of the Act, 21 U.S.C.section 360bbb-3(b)(1), unless the authorization is terminated  or revoked sooner.       Influenza A by PCR NEGATIVE NEGATIVE Final   Influenza B by PCR NEGATIVE NEGATIVE Final    Comment: (NOTE) The Xpert Xpress SARS-CoV-2/FLU/RSV plus assay is intended as an aid in the diagnosis of influenza from Nasopharyngeal swab specimens and should not be used as a sole basis for treatment. Nasal washings and aspirates are unacceptable for Xpert Xpress SARS-CoV-2/FLU/RSV testing.  Fact Sheet for Patients: BloggerCourse.com  Fact Sheet for Healthcare Providers: SeriousBroker.it  This test is not yet approved or cleared by the [place]  FDA and has been authorized for detection and/or diagnosis of SARS-CoV-2 by FDA under an Emergency Use Authorization (EUA). This EUA will remain in effect (meaning this test can be used) for the duration of the COVID-19 declaration under Section 564(b)(1) of the Act, 21 U.S.C. section 360bbb-3(b)(1), unless the authorization is terminated or revoked.     Resp Syncytial Virus by PCR NEGATIVE NEGATIVE Final    Comment: (NOTE) Fact Sheet for Patients: BloggerCourse.com  Fact Sheet for Healthcare Providers: SeriousBroker.it  This test is not yet approved or cleared by the [place]  FDA and has been authorized for detection and/or diagnosis of SARS-CoV-2 by FDA under an Emergency Use Authorization (EUA). This EUA will remain in effect (meaning this test can be  used) for the duration of the COVID-19 declaration under Section 564(b)(1) of the Act, 21 U.S.C. section 360bbb-3(b)(1), unless the authorization is terminated or revoked.  Performed at [facility], [address], [place], [place] [address]   Urine Culture     Status: Abnormal   Collection Time: 04/02/24 12:00 PM   Specimen: Urine, Random  Result Value Ref Range Status   Specimen Description   Final    URINE, RANDOM Performed at [facility], [address] [place], [place] [address]    Special Requests   Final    NONE Reflexed from [id number] Performed at [facility], [address] [place], [place] [address]    Culture (A)  Final    <10,000 COLONIES/mL INSIGNIFICANT GROWTH Performed at [facility], [address], [place], [place] [address]    Report Status 04/03/2024 FINAL  Final  MRSA Next Gen by PCR, Nasal     Status: None   Collection Time: 04/02/24  2:35 PM   Specimen: Nasal Mucosa; Nasal Swab  Result Value Ref Range Status   MRSA by PCR Next Gen NOT DETECTED NOT DETECTED Final    Comment: (NOTE) The GeneXpert MRSA Assay (FDA approved for NASAL specimens only), is one component of a comprehensive MRSA colonization surveillance program. It is not intended to diagnose MRSA infection nor to guide or monitor treatment for MRSA infections. Test performance is not FDA approved in patients less than 26 years old. Performed at [facility], [address] [place], [place] [address]   Respiratory (~20 pathogens) panel by PCR     Status: Abnormal   Collection Time: 04/02/24  3:03 PM   Specimen: Nasopharyngeal Swab; Respiratory  Result Value Ref Range Status   Adenovirus NOT DETECTED NOT DETECTED Final   Coronavirus 229E NOT DETECTED NOT DETECTED Final    Comment: (NOTE) The Coronavirus on the Respiratory Panel, DOES NOT test for the novel  Coronavirus (2019 nCoV)    Coronavirus HKU1 NOT DETECTED NOT DETECTED Final  Coronavirus NL63 NOT DETECTED NOT DETECTED Final   Coronavirus OC43 NOT DETECTED NOT DETECTED Final   Metapneumovirus NOT DETECTED NOT DETECTED Final   Rhinovirus / Enterovirus DETECTED (A) NOT DETECTED Final   Influenza A NOT DETECTED NOT DETECTED Final   Influenza B NOT DETECTED NOT DETECTED Final   Parainfluenza Virus 1 NOT DETECTED NOT DETECTED Final   Parainfluenza Virus 2 NOT DETECTED NOT DETECTED Final   Parainfluenza Virus 3 NOT DETECTED NOT DETECTED Final   Parainfluenza Virus 4 NOT DETECTED NOT DETECTED Final   Respiratory Syncytial Virus NOT DETECTED NOT DETECTED Final   Bordetella pertussis NOT DETECTED NOT DETECTED Final   Bordetella Parapertussis NOT DETECTED NOT DETECTED Final   Chlamydophila pneumoniae NOT DETECTED NOT DETECTED Final   Mycoplasma pneumoniae NOT DETECTED NOT DETECTED Final    Comment: Performed at [facility], [address] [place], [place] [address]     Labs: BNP (last 3 results) No results for input(s): "BNP" in the last 8760 hours. Basic Metabolic Panel: Recent Labs  Lab 04/02/24 0918 04/03/24 0518 04/04/24 0829 04/06/24 0815  NA 137 136 135 139  K 3.4* 4.4 3.8 4.0  CL 102 109 105 106  CO2 21* 23 20* 25  GLUCOSE 145* 152* 151* 102*  BUN 31* 32* 34* 39*  CREATININE 2.53* 1.56* 1.20 1.14  CALCIUM  8.7* 7.9* 8.6* 8.3*   Liver Function Tests: Recent Labs  Lab 04/02/24 0918  AST 38  ALT 23  ALKPHOS 61  BILITOT 0.5  PROT 6.8  ALBUMIN 3.6   No results for input(s): "LIPASE", "AMYLASE" in the last 168 hours. No results for input(s): "AMMONIA" in the last 168 hours. CBC: Recent Labs  Lab 04/02/24 0918 04/03/24 0518 04/04/24 0829 04/06/24 0815  WBC 8.1 5.9 11.5* 14.5*  NEUTROABS 5.7  --  10.5* 10.9*  HGB 12.7* 10.1* 10.1* 11.3*  HCT 39.6 31.6* 31.7* 34.7*  MCV 88.6 88.0 87.6 87.2  PLT 185 110* 141* 202   Cardiac Enzymes: No results for input(s): "CKTOTAL", "CKMB", "CKMBINDEX", "TROPONINI" in the last 168  hours. BNP: Invalid input(s): "POCBNP" CBG: Recent Labs  Lab 04/02/24 1429  GLUCAP 107*   D-Dimer No results for input(s): "DDIMER" in the last 72 hours. Hgb A1c No results for input(s): "HGBA1C" in the last 72 hours. Lipid Profile No results for input(s): "CHOL", "HDL", "LDLCALC", "TRIG", "CHOLHDL", "LDLDIRECT" in the last 72 hours. Thyroid  function studies No results for input(s): "TSH", "T4TOTAL", "T3FREE", "THYROIDAB" in the last 72 hours.  Invalid input(s): "FREET3" Anemia work up No results for input(s): "VITAMINB12", "FOLATE", "FERRITIN", "TIBC", "IRON", "RETICCTPCT" in the last 72 hours. Urinalysis    Component Value Date/Time   COLORURINE YELLOW (A) 04/02/2024 1200   APPEARANCEUR CLOUDY (A) 04/02/2024 1200   LABSPEC 1.018 04/02/2024 1200   PHURINE 5.0 04/02/2024 1200   GLUCOSEU NEGATIVE 04/02/2024 1200   HGBUR NEGATIVE 04/02/2024 1200   BILIRUBINUR NEGATIVE 04/02/2024 1200   BILIRUBINUR Negative 06/19/2016 0950   KETONESUR NEGATIVE 04/02/2024 1200   PROTEINUR 100 (A) 04/02/2024 1200   UROBILINOGEN 0.2 06/19/2016 0950   NITRITE NEGATIVE 04/02/2024 1200   LEUKOCYTESUR NEGATIVE 04/02/2024 1200   Sepsis Labs Recent Labs  Lab 04/02/24 0918 04/03/24 0518 04/04/24 0829 04/06/24 0815  WBC 8.1 5.9 11.5* 14.5*   Microbiology Recent Results (from the past 240 hours)  Culture, blood (Routine x 2)     Status: None (Preliminary result)   Collection Time: 04/02/24  9:18 AM   Specimen: BLOOD  Result  Value Ref Range Status   Specimen Description BLOOD LEFT AC  Final   Special Requests   Final    BOTTLES DRAWN AEROBIC AND ANAEROBIC Blood Culture adequate volume   Culture   Final    NO GROWTH 4 DAYS Performed at [facility], [address] [place], [place] [address]    Report Status PENDING  Incomplete  Culture, blood (Routine x 2)     Status: None (Preliminary result)   Collection Time: 04/02/24  9:35 AM   Specimen: BLOOD LEFT ARM  Result Value Ref  Range Status   Specimen Description BLOOD LEFT ARM  Final   Special Requests   Final    BOTTLES DRAWN AEROBIC AND ANAEROBIC Blood Culture results may not be optimal due to an inadequate volume of blood received in culture bottles   Culture   Final    NO GROWTH 4 DAYS Performed at [facility], [address] [place], [place] [address]    Report Status PENDING  Incomplete  Resp panel by RT-PCR (RSV, Flu A&B, Covid) Anterior Nasal Swab     Status: None   Collection Time: 04/02/24 10:12 AM   Specimen: Anterior Nasal Swab  Result Value Ref Range Status   SARS Coronavirus 2 by RT PCR NEGATIVE NEGATIVE Final    Comment: (NOTE) SARS-CoV-2 target nucleic acids are NOT DETECTED.  The SARS-CoV-2 RNA is generally detectable in upper respiratory specimens during the acute phase of infection. The lowest concentration of SARS-CoV-2 viral copies this assay can detect is 138 copies/mL. A negative result does not preclude SARS-Cov-2 infection and should not be used as the sole basis for treatment or other patient management decisions. A negative result may occur with  improper specimen collection/handling, submission of specimen other than nasopharyngeal swab, presence of viral mutation(s) within the areas targeted by this assay, and inadequate number of viral copies(<138 copies/mL). A negative result must be combined with clinical observations, patient history, and epidemiological information. The expected result is Negative.  Fact Sheet for Patients:  BloggerCourse.com  Fact Sheet for Healthcare Providers:  SeriousBroker.it  This test is no t yet approved or cleared by the [place]  FDA and  has been authorized for detection and/or diagnosis of SARS-CoV-2 by FDA under an Emergency Use Authorization (EUA). This EUA will remain  in effect (meaning this test can be used) for the duration of the COVID-19 declaration under Section  564(b)(1) of the Act, 21 U.S.C.section 360bbb-3(b)(1), unless the authorization is terminated  or revoked sooner.       Influenza A by PCR NEGATIVE NEGATIVE Final   Influenza B by PCR NEGATIVE NEGATIVE Final    Comment: (NOTE) The Xpert Xpress SARS-CoV-2/FLU/RSV plus assay is intended as an aid in the diagnosis of influenza from Nasopharyngeal swab specimens and should not be used as a sole basis for treatment. Nasal washings and aspirates are unacceptable for Xpert Xpress SARS-CoV-2/FLU/RSV testing.  Fact Sheet for Patients: BloggerCourse.com  Fact Sheet for Healthcare Providers: SeriousBroker.it  This test is not yet approved or cleared by the [place]  FDA and has been authorized for detection and/or diagnosis of SARS-CoV-2 by FDA under an Emergency Use Authorization (EUA). This EUA will remain in effect (meaning this test can be used) for the duration of the COVID-19 declaration under Section 564(b)(1) of the Act, 21 U.S.C. section 360bbb-3(b)(1), unless the authorization is terminated or revoked.     Resp Syncytial Virus by PCR NEGATIVE NEGATIVE Final    Comment: (NOTE) Fact  Sheet for Patients: BloggerCourse.com  Fact Sheet for Healthcare Providers: SeriousBroker.it  This test is not yet approved or cleared by the [place]  FDA and has been authorized for detection and/or diagnosis of SARS-CoV-2 by FDA under an Emergency Use Authorization (EUA). This EUA will remain in effect (meaning this test can be used) for the duration of the COVID-19 declaration under Section 564(b)(1) of the Act, 21 U.S.C. section 360bbb-3(b)(1), unless the authorization is terminated or revoked.  Performed at [facility], [address] [place], [place] [address]   Urine Culture     Status: Abnormal   Collection Time: 04/02/24 12:00 PM   Specimen: Urine, Random  Result  Value Ref Range Status   Specimen Description   Final    URINE, RANDOM Performed at [facility], [address] [place], [place] [address]    Special Requests   Final    NONE Reflexed from [id number] Performed at [facility], [address] [place], [place] [address]    Culture (A)  Final    <10,000 COLONIES/mL INSIGNIFICANT GROWTH Performed at [facility], [address] [place], [place] [address]    Report Status 04/03/2024 FINAL  Final  MRSA Next Gen by PCR, Nasal     Status: None   Collection Time: 04/02/24  2:35 PM   Specimen: Nasal Mucosa; Nasal Swab  Result Value Ref Range Status   MRSA by PCR Next Gen NOT DETECTED NOT DETECTED Final    Comment: (NOTE) The GeneXpert MRSA Assay (FDA approved for NASAL specimens only), is one component of a comprehensive MRSA colonization surveillance program. It is not intended to diagnose MRSA infection nor to guide or monitor treatment for MRSA infections. Test performance is not FDA approved in patients less than 3 years old. Performed at [facility], [address] [place], [place] [address]   Respiratory (~20 pathogens) panel by PCR     Status: Abnormal   Collection Time: 04/02/24  3:03 PM   Specimen: Nasopharyngeal Swab; Respiratory  Result Value Ref Range Status   Adenovirus NOT DETECTED NOT DETECTED Final   Coronavirus 229E NOT DETECTED NOT DETECTED Final    Comment: (NOTE) The Coronavirus on the Respiratory Panel, DOES NOT test for the novel  Coronavirus (2019 nCoV)    Coronavirus HKU1 NOT DETECTED NOT DETECTED Final   Coronavirus NL63 NOT DETECTED NOT DETECTED Final   Coronavirus OC43 NOT DETECTED NOT DETECTED Final   Metapneumovirus NOT DETECTED NOT DETECTED Final   Rhinovirus / Enterovirus DETECTED (A) NOT DETECTED Final   Influenza A NOT DETECTED NOT DETECTED Final   Influenza B NOT DETECTED NOT DETECTED Final   Parainfluenza Virus 1 NOT DETECTED NOT DETECTED Final    Parainfluenza Virus 2 NOT DETECTED NOT DETECTED Final   Parainfluenza Virus 3 NOT DETECTED NOT DETECTED Final   Parainfluenza Virus 4 NOT DETECTED NOT DETECTED Final   Respiratory Syncytial Virus NOT DETECTED NOT DETECTED Final   Bordetella pertussis NOT DETECTED NOT DETECTED Final   Bordetella Parapertussis NOT DETECTED NOT DETECTED Final   Chlamydophila pneumoniae NOT DETECTED NOT DETECTED Final   Mycoplasma pneumoniae NOT DETECTED NOT DETECTED Final    Comment: Performed at [facility], [address] [place], [place] [address]     Time coordinating discharge: Over 30 minutes  SIGNED:   Tiajuana Fluke, MD  [facility] 04/06/2024, 11:32 AM Pager   If 7PM-7AM, please contact night-coverage

## 2024-04-06 NOTE — Plan of Care (Signed)

## 2024-04-06 NOTE — Progress Notes (Signed)
 Mobility Specialist - Progress Note   04/06/24 0955  Mobility  Activity Ambulated independently in hallway;Stood at bedside;Dangled on edge of bed  Level of Assistance Independent  Assistive Device None  Distance Ambulated (ft) 160 ft  Activity Response Tolerated well  Mobility Referral Yes  Mobility visit 1 Mobility  Mobility Specialist Start Time (ACUTE ONLY) 0934  Mobility Specialist Stop Time (ACUTE ONLY) 0954  Mobility Specialist Time Calculation (min) (ACUTE ONLY) 20 min   Pt semi-supine in bed on 2L upon arrival. Pt STS and ambulates in hallway indep.   MD requested Mobility Specialist to perform oxygen saturation test with pt which includes removing pt from oxygen both at rest and while ambulating.  Below are the results from that testing.     Patient Saturations on Room Air at Rest = spO2 90   Patient Saturations on Room Air while Ambulating in hallway = sp02 83% .  2L = spO2 = 84   Patient Saturations on 4 Liters of oxygen while Ambulating = sp02 91%   At end of testing pt left in room on 2 Liters of oxygen.   Reported results to MD.   Wash Hack  Mobility Specialist  04/06/24 9:57 AM

## 2024-04-10 LAB — CULTURE, BLOOD (ROUTINE X 2)
Culture: NO GROWTH
Culture: NO GROWTH
Special Requests: ADEQUATE

## 2024-05-02 ENCOUNTER — Other Ambulatory Visit: Payer: Self-pay | Admitting: Internal Medicine

## 2024-05-02 DIAGNOSIS — J449 Chronic obstructive pulmonary disease, unspecified: Secondary | ICD-10-CM

## 2024-05-02 DIAGNOSIS — J189 Pneumonia, unspecified organism: Secondary | ICD-10-CM

## 2024-05-16 ENCOUNTER — Ambulatory Visit
Admission: RE | Admit: 2024-05-16 | Discharge: 2024-05-16 | Disposition: A | Discharge status: Home / Self Care | Source: Ambulatory Visit | Attending: Internal Medicine | Admitting: Internal Medicine

## 2024-05-16 DIAGNOSIS — J189 Pneumonia, unspecified organism: Secondary | ICD-10-CM | POA: Insufficient documentation

## 2024-05-16 DIAGNOSIS — J449 Chronic obstructive pulmonary disease, unspecified: Secondary | ICD-10-CM | POA: Diagnosis present

## 2024-05-20 ENCOUNTER — Other Ambulatory Visit: Payer: Self-pay | Admitting: Pulmonary Disease

## 2024-05-21 ENCOUNTER — Other Ambulatory Visit: Payer: Self-pay | Admitting: Pulmonary Disease

## 2024-05-21 DIAGNOSIS — R911 Solitary pulmonary nodule: Secondary | ICD-10-CM

## 2024-05-30 ENCOUNTER — Other Ambulatory Visit: Payer: Self-pay

## 2024-05-30 ENCOUNTER — Encounter
Admission: RE | Admit: 2024-05-30 | Discharge: 2024-05-30 | Disposition: A | Discharge status: Home / Self Care | Source: Ambulatory Visit | Attending: Pulmonary Disease | Admitting: Pulmonary Disease

## 2024-05-30 VITALS — Wt 195.0 lb

## 2024-05-30 DIAGNOSIS — I10 Essential (primary) hypertension: Secondary | ICD-10-CM

## 2024-05-30 DIAGNOSIS — Z79899 Other long term (current) drug therapy: Secondary | ICD-10-CM

## 2024-05-30 DIAGNOSIS — Z01812 Encounter for preprocedural laboratory examination: Secondary | ICD-10-CM

## 2024-05-30 HISTORY — DX: Long term (current) use of antithrombotics/antiplatelets: Z79.02

## 2024-05-30 HISTORY — DX: Presence of coronary angioplasty implant and graft: Z95.5

## 2024-05-30 HISTORY — DX: Abnormal electrocardiogram (ECG) (EKG): R94.31

## 2024-05-30 HISTORY — DX: Solitary pulmonary nodule: R91.1

## 2024-05-30 HISTORY — DX: Sepsis, unspecified organism: A41.9

## 2024-05-30 HISTORY — DX: Personal history of nicotine dependence: Z87.891

## 2024-05-30 NOTE — Patient Instructions (Addendum)
 Your procedure is scheduled on:06-06-24 Friday Report to the Registration Desk on the 1st floor of the [facility]. To find out your arrival time, please call [phone] between 1PM - 3PM on:06-05-24 Thursday If your arrival time is 6:00 am, do not arrive before that time as the [facility] entrance doors do not open until 6:00 am.  REMEMBER: Instructions that are not followed completely may result in serious medical risk, up to and including death; or upon the discretion of your surgeon and anesthesiologist your surgery may need to be rescheduled.  Do not eat food after midnight the night before surgery.  No gum chewing or hard candies.  You may however, drink CLEAR liquids up to 2 hours before you are scheduled to arrive for your surgery. Do not drink anything within 2 hours of your scheduled arrival time.  Clear liquids include: - water  - apple juice without pulp - gatorade (not RED colors) - black coffee or tea (Do NOT add milk or creamers to the coffee or tea) Do NOT drink anything that is not on this list.  One week prior to surgery:Stop NOW (05-30-24) Stop Anti-inflammatories (NSAIDS) such as meloxicam  (MOBIC ), Advil, Aleve, Ibuprofen, Motrin, Naproxen, Naprosyn and Aspirin  based products such as Excedrin, Goody's Powder, BC Powder. Stop ANY OVER THE COUNTER supplements until after surgery (Vitamin C )  You may however, continue to take Tylenol  if needed for pain up until the day of surgery.  Stop your clopidogrel  (PLAVIX ) and Aspirin  7 days prior to surgery-Last dose will be on 05-29-24 Thursday-You will restart your Aspirin  and Plavix  on 06-08-24 (Sunday) per Dr Florencio   Continue taking all of your other prescription medications up until the day of surgery.  ON THE DAY OF SURGERY ONLY TAKE THESE MEDICATIONS WITH SIPS OF WATER: -amLODipine  (NORVASC )  -pantoprazole  (PROTONIX )  -rosuvastatin  (CRESTOR )   Use your Albuterol  Nebulizer and Breztri  Inhaler the day of surgery and  bring your Albuterol  Inhaler to the hospital  No Alcohol for 24 hours before or after surgery.  No Smoking including e-cigarettes for 24 hours before surgery.  No chewable tobacco products for at least 6 hours before surgery.  No nicotine patches on the day of surgery.  Do not use any recreational drugs for at least a week (preferably 2 weeks) before your surgery.  Please be advised that the combination of cocaine and anesthesia may have negative outcomes, up to and including death. If you test positive for cocaine, your surgery will be cancelled.  On the morning of surgery brush your teeth with toothpaste and water, you may rinse your mouth with mouthwash if you wish. Do not swallow any toothpaste or mouthwash.  Do not wear jewelry, make-up, hairpins, clips or nail polish.  For welded (permanent) jewelry: bracelets, anklets, waist bands, etc.  Please have this removed prior to surgery.  If it is not removed, there is a chance that hospital personnel will need to cut it off on the day of surgery.  Do not wear lotions, powders, or perfumes.   Do not shave body hair from the neck down 48 hours before surgery.  Contact lenses, hearing aids and dentures may not be worn into surgery.  Do not bring valuables to the hospital. [facility] is not responsible for any missing/lost belongings or valuables.   Notify your doctor if there is any change in your medical condition (cold, fever, infection).  Wear comfortable clothing (specific to your surgery type) to the hospital.  After surgery, you  can help prevent lung complications by doing breathing exercises.  Take deep breaths and cough every 1-2 hours. Your doctor may order a device called an Incentive Spirometer to help you take deep breaths. When coughing or sneezing, hold a pillow firmly against your incision with both hands. This is called "splinting." Doing this helps protect your incision. It also decreases belly discomfort.  If you  are being admitted to the hospital overnight, leave your suitcase in the car. After surgery it may be brought to your room.  In case of increased patient census, it may be necessary for you, the patient, to continue your postoperative care in the Same Day Surgery department.  If you are being discharged the day of surgery, you will not be allowed to drive home. You will need a responsible individual to drive you home and stay with you for 24 hours after surgery.   If you are taking public transportation, you will need to have a responsible individual with you.  Please call the Pre-admissions Testing Dept. at [phone] if you have any questions about these instructions.  Surgery Visitation Policy:  Patients having surgery or a procedure may have two visitors.  Children under the age of 63 must have an adult with them who is not the patient.   Merchandiser, retail to address health-related social needs:  [email or web address]

## 2024-06-02 ENCOUNTER — Inpatient Hospital Stay: Admission: RE | Admit: 2024-06-02 | Source: Ambulatory Visit

## 2024-06-03 ENCOUNTER — Ambulatory Visit
Admission: RE | Admit: 2024-06-03 | Discharge: 2024-06-03 | Disposition: A | Discharge status: Home / Self Care | Source: Ambulatory Visit | Attending: Pulmonary Disease | Admitting: Pulmonary Disease

## 2024-06-03 ENCOUNTER — Encounter
Admission: RE | Admit: 2024-06-03 | Discharge: 2024-06-03 | Disposition: A | Discharge status: Home / Self Care | Source: Ambulatory Visit | Attending: Pulmonary Disease | Admitting: Pulmonary Disease

## 2024-06-03 ENCOUNTER — Encounter: Payer: Self-pay | Admitting: Pulmonary Disease

## 2024-06-03 DIAGNOSIS — R911 Solitary pulmonary nodule: Secondary | ICD-10-CM | POA: Diagnosis present

## 2024-06-03 DIAGNOSIS — Z79899 Other long term (current) drug therapy: Secondary | ICD-10-CM | POA: Insufficient documentation

## 2024-06-03 DIAGNOSIS — Z01812 Encounter for preprocedural laboratory examination: Secondary | ICD-10-CM | POA: Insufficient documentation

## 2024-06-03 DIAGNOSIS — I10 Essential (primary) hypertension: Secondary | ICD-10-CM | POA: Insufficient documentation

## 2024-06-03 LAB — BASIC METABOLIC PANEL WITH GFR
Anion gap: 8 (ref 5–15)
BUN: 18 mg/dL (ref 8–23)
CO2: 27 mmol/L (ref 22–32)
Calcium: 8.8 mg/dL — ABNORMAL LOW (ref 8.9–10.3)
Chloride: 105 mmol/L (ref 98–111)
Creatinine, Ser: 1.12 mg/dL (ref 0.61–1.24)
GFR, Estimated: >60 mL/min (ref >60)
Glucose, Bld: 145 mg/dL — ABNORMAL HIGH (ref 70–99)
Potassium: 3.6 mmol/L (ref 3.5–5.1)
Sodium: 140 mmol/L (ref 135–145)
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
## 2024-06-05 ENCOUNTER — Encounter: Payer: Self-pay | Admitting: Pulmonary Disease

## 2024-06-06 ENCOUNTER — Other Ambulatory Visit: Payer: Self-pay

## 2024-06-06 ENCOUNTER — Ambulatory Visit

## 2024-06-06 ENCOUNTER — Ambulatory Visit
Admission: RE | Admit: 2024-06-06 | Discharge: 2024-06-06 | Disposition: A | Discharge status: Home / Self Care | Attending: Pulmonary Disease | Admitting: Pulmonary Disease

## 2024-06-06 ENCOUNTER — Encounter
Admission: RE | Disposition: A | Discharge status: Home / Self Care | Payer: Self-pay | Source: Home / Self Care | Attending: Pulmonary Disease

## 2024-06-06 ENCOUNTER — Ambulatory Visit: Payer: Self-pay | Admitting: Urgent Care

## 2024-06-06 ENCOUNTER — Encounter: Payer: Self-pay | Admitting: Pulmonary Disease

## 2024-06-06 DIAGNOSIS — M1990 Unspecified osteoarthritis, unspecified site: Secondary | ICD-10-CM | POA: Insufficient documentation

## 2024-06-06 DIAGNOSIS — I5032 Chronic diastolic (congestive) heart failure: Secondary | ICD-10-CM | POA: Insufficient documentation

## 2024-06-06 DIAGNOSIS — I252 Old myocardial infarction: Secondary | ICD-10-CM | POA: Diagnosis not present

## 2024-06-06 DIAGNOSIS — I2510 Atherosclerotic heart disease of native coronary artery without angina pectoris: Secondary | ICD-10-CM | POA: Insufficient documentation

## 2024-06-06 DIAGNOSIS — J432 Centrilobular emphysema: Secondary | ICD-10-CM | POA: Insufficient documentation

## 2024-06-06 DIAGNOSIS — Z87891 Personal history of nicotine dependence: Secondary | ICD-10-CM | POA: Diagnosis not present

## 2024-06-06 DIAGNOSIS — R911 Solitary pulmonary nodule: Secondary | ICD-10-CM | POA: Diagnosis present

## 2024-06-06 DIAGNOSIS — C3411 Malignant neoplasm of upper lobe, right bronchus or lung: Secondary | ICD-10-CM | POA: Diagnosis not present

## 2024-06-06 DIAGNOSIS — Z79899 Other long term (current) drug therapy: Secondary | ICD-10-CM | POA: Diagnosis not present

## 2024-06-06 DIAGNOSIS — K219 Gastro-esophageal reflux disease without esophagitis: Secondary | ICD-10-CM | POA: Diagnosis not present

## 2024-06-06 DIAGNOSIS — Z7902 Long term (current) use of antithrombotics/antiplatelets: Secondary | ICD-10-CM | POA: Diagnosis not present

## 2024-06-06 DIAGNOSIS — I110 Hypertensive heart disease with heart failure: Secondary | ICD-10-CM | POA: Insufficient documentation

## 2024-06-06 HISTORY — PX: VIDEO BRONCHOSCOPY WITH ENDOBRONCHIAL ULTRASOUND: SHX6177

## 2024-06-06 HISTORY — DX: Other forms of dyspnea: R06.09

## 2024-06-06 HISTORY — PX: VIDEO BRONCHOSCOPY WITH ENDOBRONCHIAL NAVIGATION: SHX6175

## 2024-06-06 SURGERY — VIDEO BRONCHOSCOPY WITH ENDOBRONCHIAL NAVIGATION
Anesthesia: General

## 2024-06-06 MED ORDER — ONDANSETRON HCL 4 MG/2ML IJ SOLN
INTRAMUSCULAR | Status: DC | PRN
  Administered 2024-06-06: 4 mg via INTRAVENOUS

## 2024-06-06 MED ORDER — FENTANYL CITRATE (PF) 100 MCG/2ML IJ SOLN
INTRAMUSCULAR | Status: AC
  Filled 2024-06-06: qty 2

## 2024-06-06 MED ORDER — FENTANYL CITRATE (PF) 100 MCG/2ML IJ SOLN
INTRAMUSCULAR | Status: DC | PRN
  Administered 2024-06-06: 100 ug via INTRAVENOUS

## 2024-06-06 MED ORDER — SUGAMMADEX SODIUM 200 MG/2ML IV SOLN
INTRAVENOUS | Status: DC | PRN
  Administered 2024-06-06: 200 mg via INTRAVENOUS

## 2024-06-06 MED ORDER — ROCURONIUM BROMIDE 100 MG/10ML IV SOLN
INTRAVENOUS | Status: DC | PRN
  Administered 2024-06-06: 50 mg via INTRAVENOUS

## 2024-06-06 MED ORDER — ORAL CARE MOUTH RINSE: 15.0000 mL | Freq: Once | OROMUCOSAL | Status: DC

## 2024-06-06 MED ORDER — PROPOFOL 10 MG/ML IV BOLUS
INTRAVENOUS | Status: DC | PRN
  Administered 2024-06-06: 150 ug/kg/min via INTRAVENOUS
  Administered 2024-06-06: 200 mg via INTRAVENOUS

## 2024-06-06 MED ORDER — LIDOCAINE HCL (CARDIAC) PF 100 MG/5ML IV SOSY
PREFILLED_SYRINGE | INTRAVENOUS | Status: DC | PRN
  Administered 2024-06-06: 100 mg via INTRAVENOUS

## 2024-06-06 MED ORDER — PHENYLEPHRINE HCL-NACL 20-0.9 MG/250ML-% IV SOLN
INTRAVENOUS | Status: DC | PRN
Start: 2024-06-06 — End: 2024-06-06
  Administered 2024-06-06: 5 ug/min via INTRAVENOUS

## 2024-06-06 MED ORDER — LACTATED RINGERS IV SOLN: INTRAVENOUS | Status: DC

## 2024-06-06 MED ORDER — CHLORHEXIDINE GLUCONATE 0.12 % MT SOLN: 15.0000 mL | Freq: Once | OROMUCOSAL | Status: DC

## 2024-06-06 MED ORDER — DEXAMETHASONE SODIUM PHOSPHATE 10 MG/ML IJ SOLN
INTRAMUSCULAR | Status: DC | PRN
  Administered 2024-06-06: 10 mg via INTRAVENOUS

## 2024-06-06 NOTE — Anesthesia Procedure Notes (Signed)
 Procedure Name: Intubation Date/Time: 06/06/2024 12:07 PM  Performed by: Norleen Alberta HERO., CRNAPre-anesthesia Checklist: Patient identified, Patient being monitored, Timeout performed, Emergency Drugs available and Suction available Patient Re-evaluated:Patient Re-evaluated prior to induction Oxygen Delivery Method: Circle system utilized Preoxygenation: Pre-oxygenation with 100% oxygen Induction Type: IV induction Ventilation: Mask ventilation without difficulty Laryngoscope Size: McGrath and 4 Grade View: Grade I Tube type: Oral Tube size: 7.0 mm Number of attempts: 1 Airway Equipment and Method: Stylet Placement Confirmation: ETT inserted through vocal cords under direct vision, positive ETCO2 and breath sounds checked- equal and bilateral Secured at: 21 cm Tube secured with: Tape Dental Injury: Teeth and Oropharynx as per pre-operative assessment

## 2024-06-06 NOTE — Transfer of Care (Signed)
 Immediate Anesthesia Transfer of Care Note  Patient: Joe Dillon  Procedure(s) Performed: VIDEO BRONCHOSCOPY WITH ENDOBRONCHIAL NAVIGATION BRONCHOSCOPY, WITH EBUS  Patient Location: PACU  Anesthesia Type:General  Level of Consciousness: drowsy and patient cooperative  Airway & Oxygen Therapy: Patient Spontanous Breathing and Patient connected to face mask oxygen  Post-op Assessment: Report given to RN and Post -op Vital signs reviewed and stable  Post vital signs: stable  Last Vitals:  Vitals Value Taken Time  BP 116/78 06/06/24 13:19  Temp    Pulse 66 06/06/24 13:23  Resp 16 06/06/24 13:23  SpO2 100 % 06/06/24 13:23  Vitals shown include unfiled device data.  Last Pain:  Vitals:   06/06/24 0919  TempSrc: Temporal  PainSc: 0-No pain         Complications: No notable events documented.
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
## 2024-06-06 NOTE — Anesthesia Postprocedure Evaluation (Signed)
 Anesthesia Post Note  Patient: Joe Dillon  Procedure(s) Performed: VIDEO BRONCHOSCOPY WITH ENDOBRONCHIAL NAVIGATION BRONCHOSCOPY, WITH EBUS  Patient location during evaluation: PACU Anesthesia Type: General Level of consciousness: awake and alert Pain management: pain level controlled Vital Signs Assessment: post-procedure vital signs reviewed and stable Respiratory status: spontaneous breathing, nonlabored ventilation and respiratory function stable Cardiovascular status: blood pressure returned to baseline and stable Postop Assessment: no apparent nausea or vomiting Anesthetic complications: no   There were no known notable events for this encounter.   Last Vitals:  Vitals:   06/06/24 1415 06/06/24 1431  BP: (!) 140/76 (!) 165/79  Pulse: 61   Resp: (!) 21 14  Temp: (!) 36 C 36.8 C  SpO2: 93% 93%    Last Pain:  Vitals:   06/06/24 1431  TempSrc: Temporal  PainSc: 0-No pain                 Camellia Merilee Louder

## 2024-06-06 NOTE — Procedures (Signed)
 ROBOTIC NAVIGATIONAL BRONCHOSCOPY PROCEDURE NOTE  FIBEROPTIC BRONCHOSCOPY WITH BRONCHOALVEOLAR LAVAGE PROCEDURE NOTE  FLEXIBLE BRONCHOSCOPY WITH THERAPEUTIC ASPIRATION OF TRACHEOBRONCHIAL TREE  TRANSBRONCHIAL LUNG BIOPSY X 2 LOBES  TRANSBRONCHIAL NEEDLE BIOPSY X 2 LOBES  TRANSBRONCHIAL CYTOBRUSH   RADIAL ENDOBRONCHIAL ULTRASOUND  ENDOBRONCHIAL ULTRASOUND >/= 1 LYMPH NODE PROCEDURE NOTE    Flexible bronchoscopy was performed  by : Parris MD  assistance by : 1)Repiratory therapist  and 2)[employer] staff and 3) Anesthesia team and 4) Flouroscopy team and 5) [employer] supporting staff   Indication for the procedure was :  Pre-procedural H&P. The following assessment was performed on the day of the procedure prior to initiating sedation History:  Chest pain n Dyspnea y Hemoptysis n Cough y Fever n Other pertinent items n  Examination Vital signs -reviewed as per nursing documentation today Cardiac    Murmurs: n  Rubs : n  Gallop: n Lungs Wheezing: n Rales : n Rhonchi :y  Other pertinent findings: SOB/hypoxemia due to chronic lung disease   Pre-procedural assessment for Procedural Sedation included: Depth of sedation: As per anesthesia team  ASA Classification:  2 Mallampati airway assessment: 3    Medication list reviewed: y  The patient's interval history was taken and revealed: no new complaints The pre- procedure physical examination revealed: No new findings Refer to prior clinic note for details.  Informed Consent: Informed consent was obtained from:  patient after explanation of procedure and risks, benefits, as well as alternative procedures available.  Explanation of level of sedation and possible transfusion was also provided.    Procedural Preparation: Time out was performed and patient was identified by name and birthdate and procedure to be performed and side for sampling, if any, was specified. Pt was intubated by anesthesia.  The  patient was appropriately draped.   Fiberoptic bronchoscopy with airway inspection and BAL Procedure findings:  Bronchoscope was inserted via ETT  without difficulty.  Posterior oropharynx, epiglottis, arytenoids, false cords and vocal cords were not visualized as these were bypassed by endotracheal tube. The distal trachea was normal in circumference and appearance without mucosal, cartilaginous or branching abnormalities.  The main carina was mildly splayed . All right and left lobar airways were visualized to the Subsegmental level.  Sub- sub segmental carinae were identified in all the distal airways.   Secretions were visible in the following airways and appeared to be clear.  The mucosa was : friable at RUL  Airways were notable for:        exophytic lesions :n       extrinsic compression in the following distributions: n.       Friable mucosa: y       Teacher, music /pigmentation: n   Mucus plugging noted bilaterally, treated with therapeutic aspiration bilaterally .  Bal was done at right upper lobe.   Post procedure Diagnosis:   mucus plugging of tracheobronchial tree     Electromagnetic Navigational Bronchoscopy Procedure Findings:    Post appropriate planning and registration peripheral navigation was used to visualize target lesion.    TARGET 1 - RIGHT LOWER LOBE - CYTOBRUSH TRANSBRONCHIAL X 1 - FNA TRANSBRONCHIAL X 4 , SURGICAL PATHOLOGY TRASNBRONCHIAL X 5 TARGET 2 - RIGHT UPPER LOBE - SURGICAL PATHOLOGY TRASNBRONCHIAL FORCEPS BIOPSY X 5 - BAL X 1  Post procedure diagnosis: LUNG CANCER      Endobronchial ultrasound assisted hilar and mediastinal lymph node biopsies procedure findings: The fiberoptic bronchoscope was removed and the EBUS scope was introduced. Examination began  to evaluate for pathologically enlarged lymph nodes starting on the LEFT  side progressing to the RIGHT side.  All lymph node biopsies performed with 21 needle. Lymph node biopsies were sent  in cytolite for all stations.  STATION 10L - NOT BIOPSIED STATION 7 - 1.2CM BIOPSIED 3 TIMES STATION 10R - NOT BIOPSIED STATION 4R - NOT BIOPSIED    Post procedure diagnosis:  SUBCARINAL LYMPHADENOPATHY   Specimens obtained included:   Immediate sampling complications included:NONE  Epinephrine ZERO ml was used topically  The bronchoscopy was terminated due to completion of the planned procedure and the bronchoscope was removed.   Total dosage of Lidocaine  was ZERO mg Estimated Blood loss: EXPECTED < 5 cc.  Complications included:  NONE IMMEDIATE   Preliminary CXR findings :  IN PROCESS   Disposition: HOME WITH FAMILY   Follow up with Dr. Alder Murri in 5 days for result discussion.     Halina Picking MD  [facility] Division of Pulmonary & Critical Care Medicine
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
## 2024-06-07 ENCOUNTER — Encounter: Payer: Self-pay | Admitting: Pulmonary Disease

## 2024-06-09 ENCOUNTER — Encounter: Payer: Self-pay | Admitting: Pulmonary Disease

## 2024-06-10 LAB — CYTOLOGY - NON PAP

## 2024-06-10 LAB — SURGICAL PATHOLOGY

## 2024-06-12 ENCOUNTER — Inpatient Hospital Stay: Attending: Oncology | Admitting: Oncology

## 2024-06-12 ENCOUNTER — Encounter: Payer: Self-pay | Admitting: Oncology

## 2024-06-12 ENCOUNTER — Inpatient Hospital Stay

## 2024-06-12 VITALS — BP 148/68 | HR 85 | Temp 97.5°F | Resp 18 | Ht 70.0 in | Wt 195.0 lb

## 2024-06-12 DIAGNOSIS — D72829 Elevated white blood cell count, unspecified: Secondary | ICD-10-CM | POA: Diagnosis not present

## 2024-06-12 DIAGNOSIS — D649 Anemia, unspecified: Secondary | ICD-10-CM | POA: Insufficient documentation

## 2024-06-12 DIAGNOSIS — Z860101 Personal history of adenomatous and serrated colon polyps: Secondary | ICD-10-CM | POA: Insufficient documentation

## 2024-06-12 DIAGNOSIS — I10 Essential (primary) hypertension: Secondary | ICD-10-CM | POA: Diagnosis not present

## 2024-06-12 DIAGNOSIS — C3491 Malignant neoplasm of unspecified part of right bronchus or lung: Secondary | ICD-10-CM | POA: Insufficient documentation

## 2024-06-12 DIAGNOSIS — C3490 Malignant neoplasm of unspecified part of unspecified bronchus or lung: Secondary | ICD-10-CM

## 2024-06-12 NOTE — Progress Notes (Unsigned)
 Patient isn't having any symptoms as of now.
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
## 2024-06-13 DIAGNOSIS — C3491 Malignant neoplasm of unspecified part of right bronchus or lung: Secondary | ICD-10-CM | POA: Insufficient documentation

## 2024-06-23 ENCOUNTER — Ambulatory Visit
Admission: RE | Admit: 2024-06-23 | Discharge: 2024-06-23 | Disposition: A | Discharge status: Home / Self Care | Source: Ambulatory Visit | Attending: Oncology | Admitting: Oncology

## 2024-06-23 DIAGNOSIS — R911 Solitary pulmonary nodule: Secondary | ICD-10-CM | POA: Diagnosis not present

## 2024-06-23 DIAGNOSIS — I700 Atherosclerosis of aorta: Secondary | ICD-10-CM | POA: Insufficient documentation

## 2024-06-23 DIAGNOSIS — C3490 Malignant neoplasm of unspecified part of unspecified bronchus or lung: Secondary | ICD-10-CM | POA: Insufficient documentation

## 2024-06-23 DIAGNOSIS — J189 Pneumonia, unspecified organism: Secondary | ICD-10-CM | POA: Insufficient documentation

## 2024-06-23 DIAGNOSIS — J439 Emphysema, unspecified: Secondary | ICD-10-CM | POA: Diagnosis not present

## 2024-06-23 LAB — GLUCOSE, CAPILLARY: Glucose-Capillary: 93 mg/dL (ref 70–99)

## 2024-06-23 MED ORDER — FLUDEOXYGLUCOSE F - 18 (FDG) INJECTION
10.1000 | Freq: Once | INTRAVENOUS | Status: AC | PRN
  Administered 2024-06-23: 10.96 via INTRAVENOUS

## 2024-06-27 ENCOUNTER — Telehealth: Payer: Self-pay | Admitting: *Deleted

## 2024-06-27 NOTE — Telephone Encounter (Signed)
 The wife called and wanted to see if she can know what the PET scan showed.  I told the wife that it still not completed and we do not have anything yet. The appointment is for Monday I told her that they have radiology people over the weekend to but I do not know what results.  Hopefully they will get it over the weekend.

## 2024-06-27 NOTE — Telephone Encounter (Signed)
 Message left with [facility] to get final read on PET scan prior to his appt Monday morning.

## 2024-06-30 ENCOUNTER — Inpatient Hospital Stay (HOSPITAL_BASED_OUTPATIENT_CLINIC_OR_DEPARTMENT_OTHER): Admitting: Oncology

## 2024-06-30 ENCOUNTER — Ambulatory Visit
Admission: RE | Admit: 2024-06-30 | Discharge: 2024-06-30 | Disposition: A | Discharge status: Home / Self Care | Source: Ambulatory Visit | Attending: Radiation Oncology | Admitting: Radiation Oncology

## 2024-06-30 ENCOUNTER — Encounter: Payer: Self-pay | Admitting: *Deleted

## 2024-06-30 ENCOUNTER — Encounter: Payer: Self-pay | Admitting: Oncology

## 2024-06-30 ENCOUNTER — Encounter: Payer: Self-pay | Admitting: Radiation Oncology

## 2024-06-30 DIAGNOSIS — C3491 Malignant neoplasm of unspecified part of right bronchus or lung: Secondary | ICD-10-CM | POA: Diagnosis not present

## 2024-06-30 DIAGNOSIS — C3411 Malignant neoplasm of upper lobe, right bronchus or lung: Secondary | ICD-10-CM | POA: Diagnosis present

## 2024-06-30 DIAGNOSIS — C3490 Malignant neoplasm of unspecified part of unspecified bronchus or lung: Secondary | ICD-10-CM | POA: Diagnosis not present
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
## 2024-06-30 NOTE — Progress Notes (Signed)
 Met with patient during follow up visit with Dr. [name]. All questions answered during visit. Introduced to navigator services. Contact info given. Instructed to call with any questions or needs. Pt verbalized understanding.
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
## 2024-06-30 NOTE — Progress Notes (Signed)
 Patient had a PET scan on 06/23/2024. He is doing ok.

## 2024-07-02 ENCOUNTER — Encounter: Payer: Self-pay | Admitting: *Deleted

## 2024-07-02 ENCOUNTER — Ambulatory Visit
Admission: RE | Admit: 2024-07-02 | Discharge: 2024-07-02 | Disposition: A | Discharge status: Home / Self Care | Source: Ambulatory Visit | Attending: Radiation Oncology | Admitting: Radiation Oncology

## 2024-07-02 DIAGNOSIS — C3411 Malignant neoplasm of upper lobe, right bronchus or lung: Secondary | ICD-10-CM | POA: Diagnosis not present

## 2024-07-09 ENCOUNTER — Inpatient Hospital Stay: Attending: Oncology | Admitting: Hospice and Palliative Medicine

## 2024-07-09 DIAGNOSIS — C3490 Malignant neoplasm of unspecified part of unspecified bronchus or lung: Secondary | ICD-10-CM

## 2024-07-09 DIAGNOSIS — I10 Essential (primary) hypertension: Secondary | ICD-10-CM | POA: Insufficient documentation

## 2024-07-09 DIAGNOSIS — C3411 Malignant neoplasm of upper lobe, right bronchus or lung: Secondary | ICD-10-CM | POA: Insufficient documentation

## 2024-07-09 DIAGNOSIS — Z87891 Personal history of nicotine dependence: Secondary | ICD-10-CM | POA: Insufficient documentation

## 2024-07-09 NOTE — Progress Notes (Signed)
 Multidisciplinary Oncology Council Documentation  Joe Dillon was presented by our [facility] on 07/09/2024, which included representatives from:  Palliative Care Dietitian  Physical/Occupational Therapist Nurse Navigator Genetics Social work Survivorship RN Financial Navigator Research RN   Joe Dillon currently presents with history of lung cancer  We reviewed previous medical and familial history, history of present illness, and recent lab results along with all available histopathologic and imaging studies. The MOC considered available treatment options and made the following recommendations/referrals:  None currently  The MOC is a meeting of clinicians from various specialty areas who evaluate and discuss patients for whom a multidisciplinary approach is being considered. Final determinations in the plan of care are those of the provider(s).   Today's extended care, comprehensive team conference, Joe Dillon was not present for the discussion and was not examined.

## 2024-07-15 ENCOUNTER — Other Ambulatory Visit: Payer: Self-pay

## 2024-07-15 ENCOUNTER — Encounter: Payer: Self-pay | Admitting: *Deleted

## 2024-07-15 ENCOUNTER — Ambulatory Visit
Admission: RE | Admit: 2024-07-15 | Discharge: 2024-07-15 | Disposition: A | Discharge status: Home / Self Care | Source: Ambulatory Visit | Attending: Radiation Oncology | Admitting: Radiation Oncology

## 2024-07-15 DIAGNOSIS — C3411 Malignant neoplasm of upper lobe, right bronchus or lung: Secondary | ICD-10-CM | POA: Diagnosis not present

## 2024-07-15 LAB — RAD ONC ARIA SESSION SUMMARY
Course Elapsed Days: 0
Plan Fractions Treated to Date: 1
Plan Prescribed Dose Per Fraction: 12 Gy
Plan Total Fractions Prescribed: 5
Plan Total Prescribed Dose: 60 Gy
Reference Point Dosage Given to Date: 12 Gy
Reference Point Session Dosage Given: 12 Gy
Session Number: 1

## 2024-07-17 ENCOUNTER — Other Ambulatory Visit: Payer: Self-pay

## 2024-07-17 ENCOUNTER — Ambulatory Visit
Admission: RE | Admit: 2024-07-17 | Discharge: 2024-07-17 | Disposition: A | Discharge status: Home / Self Care | Source: Ambulatory Visit | Attending: Radiation Oncology | Admitting: Radiation Oncology

## 2024-07-17 DIAGNOSIS — C3411 Malignant neoplasm of upper lobe, right bronchus or lung: Secondary | ICD-10-CM | POA: Diagnosis not present

## 2024-07-17 LAB — RAD ONC ARIA SESSION SUMMARY
Course Elapsed Days: 2
Plan Fractions Treated to Date: 2
Plan Prescribed Dose Per Fraction: 12 Gy
Plan Total Fractions Prescribed: 5
Plan Total Prescribed Dose: 60 Gy
Reference Point Dosage Given to Date: 24 Gy
Reference Point Session Dosage Given: 12 Gy
Session Number: 2

## 2024-07-21 ENCOUNTER — Ambulatory Visit
Admission: RE | Admit: 2024-07-21 | Discharge: 2024-07-21 | Disposition: A | Discharge status: Home / Self Care | Source: Ambulatory Visit | Attending: Radiation Oncology | Admitting: Radiation Oncology

## 2024-07-21 ENCOUNTER — Other Ambulatory Visit: Payer: Self-pay

## 2024-07-21 DIAGNOSIS — C3411 Malignant neoplasm of upper lobe, right bronchus or lung: Secondary | ICD-10-CM | POA: Diagnosis not present

## 2024-07-21 LAB — RAD ONC ARIA SESSION SUMMARY
Course Elapsed Days: 6
Plan Fractions Treated to Date: 3
Plan Prescribed Dose Per Fraction: 12 Gy
Plan Total Fractions Prescribed: 5
Plan Total Prescribed Dose: 60 Gy
Reference Point Dosage Given to Date: 36 Gy
Reference Point Session Dosage Given: 12 Gy
Session Number: 3

## 2024-07-23 ENCOUNTER — Other Ambulatory Visit: Payer: Self-pay

## 2024-07-23 ENCOUNTER — Ambulatory Visit
Admission: RE | Admit: 2024-07-23 | Discharge: 2024-07-23 | Disposition: A | Discharge status: Home / Self Care | Source: Ambulatory Visit | Attending: Radiation Oncology | Admitting: Radiation Oncology

## 2024-07-23 DIAGNOSIS — C3411 Malignant neoplasm of upper lobe, right bronchus or lung: Secondary | ICD-10-CM | POA: Diagnosis not present

## 2024-07-23 LAB — RAD ONC ARIA SESSION SUMMARY
Course Elapsed Days: 8
Plan Fractions Treated to Date: 4
Plan Prescribed Dose Per Fraction: 12 Gy
Plan Total Fractions Prescribed: 5
Plan Total Prescribed Dose: 60 Gy
Reference Point Dosage Given to Date: 48 Gy
Reference Point Session Dosage Given: 12 Gy
Session Number: 4

## 2024-07-28 ENCOUNTER — Other Ambulatory Visit: Payer: Self-pay

## 2024-07-28 ENCOUNTER — Ambulatory Visit
Admission: RE | Admit: 2024-07-28 | Discharge: 2024-07-28 | Disposition: A | Discharge status: Home / Self Care | Source: Ambulatory Visit | Attending: Radiation Oncology | Admitting: Radiation Oncology

## 2024-07-28 DIAGNOSIS — C3411 Malignant neoplasm of upper lobe, right bronchus or lung: Secondary | ICD-10-CM | POA: Diagnosis not present

## 2024-07-28 LAB — RAD ONC ARIA SESSION SUMMARY
Course Elapsed Days: 13
Plan Fractions Treated to Date: 5
Plan Prescribed Dose Per Fraction: 12 Gy
Plan Total Fractions Prescribed: 5
Plan Total Prescribed Dose: 60 Gy
Reference Point Dosage Given to Date: 60 Gy
Reference Point Session Dosage Given: 12 Gy
Session Number: 5

## 2024-07-29 ENCOUNTER — Encounter: Payer: Self-pay | Admitting: Oncology

## 2024-07-29 ENCOUNTER — Inpatient Hospital Stay: Admitting: Oncology

## 2024-07-29 VITALS — BP 148/85 | HR 80 | Temp 98.2°F | Resp 18 | Ht 70.0 in | Wt 195.0 lb

## 2024-07-29 DIAGNOSIS — C3491 Malignant neoplasm of unspecified part of right bronchus or lung: Secondary | ICD-10-CM

## 2024-07-29 DIAGNOSIS — Z87891 Personal history of nicotine dependence: Secondary | ICD-10-CM | POA: Diagnosis not present

## 2024-07-29 DIAGNOSIS — I10 Essential (primary) hypertension: Secondary | ICD-10-CM | POA: Diagnosis not present

## 2024-07-29 DIAGNOSIS — C3411 Malignant neoplasm of upper lobe, right bronchus or lung: Secondary | ICD-10-CM | POA: Diagnosis present

## 2024-07-29 NOTE — Progress Notes (Signed)
 Patient has finished his radiation and they would like to know if the tumor is gone or if it has shrunk? He is coughing up yellow stuff and has more shortness of breath.
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
## 2024-08-01 NOTE — Radiation Completion Notes (Signed)
 Patient Name: Joe Dillon, Joe Dillon MRN: [id number] Date of Birth: Aug 11, 1956 Referring Physician: AUSTON REYES BIRCH, M.D. Date of Service: 2024-08-01 Radiation Oncologist: Marcey Penton, M.D. [facility]                             RADIATION ONCOLOGY END OF TREATMENT NOTE     Diagnosis: C34.11 Malignant neoplasm of upper lobe, right bronchus or lung Staging on 2024-06-13: Squamous cell carcinoma of right lung (HCC) T=cT1a, N=cN0, M=cM0 Intent: Curative     HPI: Patient is a 68 year old male open the past 2 years has had several bouts of pneumonia.  This prompted serial CT scans 1 of which in July of this year showed a 1.6 x 1.6 solid rounded irregular shaped nodule in the medial basal segment the right lower lobe concerning for neoplasm.  There are also some several other nodules including a less than 1 cm nodule in the right upper lobe.  Does have stable slightly prominent right hilar lymph nodes and borderline left hilar nodes.  PET scan was performed this month showing small subpleural nodule in the right upper lobe demonstrating low-level metabolic activity.  There was a new nearby 5 mm right upper lobe nodule with low-level metabolic activity presumably inflammatory in nature.  Patient did have low-level metabolic activity in the subcarinal station and both hila corresponding to mildly enlarged lymph nodes.  Patient underwent navigational bronchoscopy with a right upper lobe nodule positive for moderate to poorly differentiated squamous cell carcinoma.  Cells were p40 and negative for TTF-1.  Right lower lobe biopsy was negative for malignancy.  Case was presented weekly tumor conference and recommendation for SBRT was made.  Patient has declined surgical intervention.  He is seen today for evaluation is doing well specifically [place] cough mopped assist chest tightness or any pain.      ==========DELIVERED PLANS==========  First Treatment Date: 2024-07-15 Last Treatment  Date: 2024-07-28   Plan Name: Lung_R_SBRT Site: Lung, Right Technique: SBRT/SRT-IMRT Mode: Photon Dose Per Fraction: 12 Gy Prescribed Dose (Delivered / Prescribed): 60 Gy / 60 Gy Prescribed Fxs (Delivered / Prescribed): 5 / 5     ==========ON TREATMENT VISIT DATES========== 2024-07-15, 2024-07-17, 2024-07-21, 2024-07-21, 2024-07-23, 2024-07-28     ==========UPCOMING VISITS========== 10/27/2024 [facility]-PET CT NM PET WB & SB TO MID THIGH [facility]-PET CT1  09/01/2024 [facility] RAD ONCOLOGY FOLLOW UP 30 Chrystal, Glenn, MD        ==========APPENDIX - ON TREATMENT VISIT NOTES==========   See weekly On Treatment Notes in Epic for details in the Media tab (listed as Progress notes on the On Treatment Visit Dates listed above).

## 2024-09-01 ENCOUNTER — Ambulatory Visit
Admission: RE | Admit: 2024-09-01 | Discharge: 2024-09-01 | Disposition: A | Source: Ambulatory Visit | Attending: Radiation Oncology | Admitting: Radiation Oncology

## 2024-09-01 ENCOUNTER — Encounter: Payer: Self-pay | Admitting: Radiation Oncology

## 2024-09-01 VITALS — BP 134/83 | HR 72 | Temp 98.6°F | Resp 16 | Wt 195.0 lb

## 2024-09-01 DIAGNOSIS — C3411 Malignant neoplasm of upper lobe, right bronchus or lung: Secondary | ICD-10-CM | POA: Diagnosis present

## 2024-09-01 DIAGNOSIS — C3491 Malignant neoplasm of unspecified part of right bronchus or lung: Secondary | ICD-10-CM

## 2024-09-01 DIAGNOSIS — Z923 Personal history of irradiation: Secondary | ICD-10-CM | POA: Insufficient documentation

## 2024-09-01 NOTE — Progress Notes (Signed)
 Radiation Oncology Follow up Note  Name: Joe Dillon   Date:   09/01/2024 MRN:  [id number] DOB: [birth year]    This 68 y.o. male presents to the clinic today for 1 month follow-up status post SBRT for stage Ia 1 squamous cell carcinoma the right upper lobe.  REFERRING PROVIDER: [name], MD  HPI: Patient is a 69 year old male now out 1 month and completed SBRT for stage Ia 1 squamous cell carcinoma right upper lobe seen today in routine follow-up he is doing well no significant change in his pulmonary status still says dyspnea on exertion and shortness of breath.  Has a mild cough with some phlegm.  All clear..  COMPLICATIONS OF TREATMENT: none  FOLLOW UP COMPLIANCE: keeps appointments   PHYSICAL EXAM:  BP 134/83   Pulse 72   Temp 98.6 F (37 C) (Tympanic)   Resp 16   Wt 195 lb (88.5 kg)   BMI 27.98 kg/m  Well-developed well-nourished patient in NAD. HEENT reveals PERLA, EOMI, discs not visualized.  Oral cavity is clear. No oral mucosal lesions are identified. Neck is clear without evidence of cervical or supraclavicular adenopathy. Lungs are clear to A&P. Cardiac examination is essentially unremarkable with regular rate and rhythm without murmur rub or thrill. Abdomen is benign with no organomegaly or masses noted. Motor sensory and DTR levels are equal and symmetric in the upper and lower extremities. Cranial nerves II through XII are grossly intact. Proprioception is intact. No peripheral adenopathy or edema is identified. No motor or sensory levels are noted. Crude visual fields are within normal range.  RADIOLOGY RESULTS: Patient has PET CT scan ordered for the end of December.  I would like to take this opportunity to thank you for allowing me to participate in the care of your patient.SABRA  PLAN:Present time patient is doing well no significant side effects from his SBRT.  I am pleased with his overall progress.  I will asked to see him back in 3 months for follow-up  which time have a chance to evaluate his PET scan.  Patient knows to call with any concerns.  I would like to take this opportunity to thank you for allowing me to participate in the care of your patient.SABRA Marcey Penton, MD

## 2024-10-27 ENCOUNTER — Ambulatory Visit

## 2024-10-28 ENCOUNTER — Ambulatory Visit
Admission: RE | Admit: 2024-10-28 | Discharge: 2024-10-28 | Disposition: A | Discharge status: Home / Self Care | Source: Ambulatory Visit | Attending: Oncology | Admitting: Oncology

## 2024-10-28 DIAGNOSIS — C3491 Malignant neoplasm of unspecified part of right bronchus or lung: Secondary | ICD-10-CM | POA: Insufficient documentation

## 2024-10-28 DIAGNOSIS — J439 Emphysema, unspecified: Secondary | ICD-10-CM | POA: Diagnosis not present

## 2024-10-28 DIAGNOSIS — I700 Atherosclerosis of aorta: Secondary | ICD-10-CM | POA: Diagnosis not present

## 2024-10-28 DIAGNOSIS — I2510 Atherosclerotic heart disease of native coronary artery without angina pectoris: Secondary | ICD-10-CM | POA: Insufficient documentation

## 2024-10-28 LAB — GLUCOSE, CAPILLARY: Glucose-Capillary: 85 mg/dL (ref 70–99)

## 2024-10-28 MED ORDER — FLUDEOXYGLUCOSE F - 18 (FDG) INJECTION
11.0300 | Freq: Once | INTRAVENOUS | Status: AC | PRN
  Administered 2024-10-28: 11.03 via INTRAVENOUS

## 2024-11-05 ENCOUNTER — Inpatient Hospital Stay: Attending: Oncology | Admitting: Oncology

## 2024-11-05 ENCOUNTER — Encounter: Payer: Self-pay | Admitting: Oncology

## 2024-11-05 VITALS — BP 135/80 | HR 73 | Temp 97.0°F | Ht 70.0 in | Wt 201.0 lb

## 2024-11-05 DIAGNOSIS — C3491 Malignant neoplasm of unspecified part of right bronchus or lung: Secondary | ICD-10-CM

## 2024-11-05 DIAGNOSIS — I10 Essential (primary) hypertension: Secondary | ICD-10-CM | POA: Diagnosis not present

## 2024-11-05 DIAGNOSIS — R918 Other nonspecific abnormal finding of lung field: Secondary | ICD-10-CM | POA: Diagnosis not present

## 2024-11-05 DIAGNOSIS — Z923 Personal history of irradiation: Secondary | ICD-10-CM | POA: Diagnosis not present

## 2024-11-05 DIAGNOSIS — C3411 Malignant neoplasm of upper lobe, right bronchus or lung: Secondary | ICD-10-CM | POA: Diagnosis present
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
## 2024-11-07 ENCOUNTER — Telehealth: Payer: Self-pay | Admitting: Oncology

## 2024-11-07 ENCOUNTER — Encounter: Payer: Self-pay | Admitting: Oncology

## 2024-11-07 NOTE — Telephone Encounter (Signed)
 Called pt to sched CT - pt spouse answered and put call on speaker - pt and spouse confirmed date/time/location - pt spouse requested appt reminder via mychart and mail - LH

## 2024-11-10 ENCOUNTER — Other Ambulatory Visit: Payer: Self-pay | Admitting: Pulmonary Disease

## 2024-11-21 ENCOUNTER — Encounter
Admission: RE | Admit: 2024-11-21 | Discharge: 2024-11-21 | Disposition: A | Discharge status: Home / Self Care | Source: Ambulatory Visit | Attending: Pulmonary Disease | Admitting: Pulmonary Disease

## 2024-11-21 ENCOUNTER — Other Ambulatory Visit: Payer: Self-pay

## 2024-11-21 VITALS — Ht 70.0 in | Wt 201.0 lb

## 2024-11-21 DIAGNOSIS — I10 Essential (primary) hypertension: Secondary | ICD-10-CM | POA: Insufficient documentation

## 2024-11-21 DIAGNOSIS — Z01818 Encounter for other preprocedural examination: Secondary | ICD-10-CM | POA: Diagnosis not present

## 2024-11-21 DIAGNOSIS — Z01810 Encounter for preprocedural cardiovascular examination: Secondary | ICD-10-CM

## 2024-11-21 DIAGNOSIS — Z01812 Encounter for preprocedural laboratory examination: Secondary | ICD-10-CM

## 2024-11-21 DIAGNOSIS — J449 Chronic obstructive pulmonary disease, unspecified: Secondary | ICD-10-CM | POA: Diagnosis present

## 2024-11-21 DIAGNOSIS — J441 Chronic obstructive pulmonary disease with (acute) exacerbation: Secondary | ICD-10-CM | POA: Diagnosis not present

## 2024-11-21 LAB — CBC
HCT: 36.9 % — ABNORMAL LOW (ref 39.0–52.0)
Hemoglobin: 12.1 g/dL — ABNORMAL LOW (ref 13.0–17.0)
MCH: 30 pg (ref 26.0–34.0)
MCHC: 32.8 g/dL (ref 30.0–36.0)
MCV: 91.3 fL (ref 80.0–100.0)
Platelets: 185 K/uL (ref 150–400)
RBC: 4.04 MIL/uL — ABNORMAL LOW (ref 4.22–5.81)
RDW: 11.9 % (ref 11.5–15.5)
WBC: 6.8 K/uL (ref 4.0–10.5)
nRBC: 0 % (ref 0.0–0.2)

## 2024-11-21 LAB — BASIC METABOLIC PANEL WITH GFR
Anion gap: 9 (ref 5–15)
BUN: 18 mg/dL (ref 8–23)
CO2: 28 mmol/L (ref 22–32)
Calcium: 9.1 mg/dL (ref 8.9–10.3)
Chloride: 100 mmol/L (ref 98–111)
Creatinine, Ser: 1.2 mg/dL (ref 0.61–1.24)
GFR, Estimated: >60 mL/min
Glucose, Bld: 95 mg/dL (ref 70–99)
Potassium: 3.8 mmol/L (ref 3.5–5.1)
Sodium: 137 mmol/L (ref 135–145)

## 2024-11-21 NOTE — Patient Instructions (Addendum)
 Your procedure is scheduled on:  FRIDAY JANUARY 23  Report to the Registration Desk on the 1st floor of the [facility] To find out your arrival time, please call [phone] between 1PM - 3PM on:  THURSDAY  JANUARY 22 If your arrival time is 6:00 am, do not arrive before that time as the [facility] entrance doors do not open until 6:00 am.  REMEMBER: Instructions that are not followed completely may result in serious medical risk, up to and including death; or upon the discretion of your surgeon and anesthesiologist your surgery may need to be rescheduled.  Do not eat food after midnight the night before surgery.  No gum chewing or hard candies.  You may however, drink CLEAR liquids up to 2 hours before you are scheduled to arrive for your surgery. Do not drink anything within 2 hours of your scheduled arrival time.  Clear liquids include: - water  - apple juice without pulp - gatorade (not RED colors) - black coffee or tea (Do NOT add milk or creamers to the coffee or tea) Do NOT drink anything that is not on this list.   One week prior to surgery: STARTING FRIDAY  JANUARY 16  Stop Anti-inflammatories (NSAIDS) such as Advil, Aleve, Ibuprofen, Motrin, Naproxen, Naprosyn and Aspirin  based products such as Excedrin, Goody's Powder, BC Powder. Stop ANY OVER THE COUNTER supplements until after surgery. Ascorbic Acid  (VITAMIN C )  fluticasone  (FLONASE )   You may however, continue to take Tylenol  if needed for pain up until the day of surgery. meloxicam  (MOBIC ) hold 7 days prior to procedure, last dose Friday January 16  **Follow recommendations regarding stopping blood thinners.** clopidogrel  (PLAVIX ) Last dose Sunday JANUARY 18 as instructed by your cardiologist   Continue taking all of your other prescription medications up until the day of surgery.  ON THE MORNING OF SURGERY DO NOT TAKE ANY MEDICATION  Use inhalers on the day of surgery and bring to the hospital. albuterol   (PROVENTIL )  albuterol  (VENTOLIN  HFA)   No Alcohol for 24 hours before or after surgery.  Do not use any recreational drugs for at least a week (preferably 2 weeks) before your surgery.  Please be advised that the combination of cocaine and anesthesia may have negative outcomes, up to and including death. If you test positive for cocaine, your surgery will be cancelled.  On the morning of surgery brush your teeth with toothpaste and water, you may rinse your mouth with mouthwash if you wish. Do not swallow any toothpaste or mouthwash.  Do not wear jewelry, make-up, hairpins, clips or nail polish.  For welded (permanent) jewelry: bracelets, anklets, waist bands, etc.  Please have this removed prior to surgery.  If it is not removed, there is a chance that hospital personnel will need to cut it off on the day of surgery.  Do not wear lotions, powders, or perfumes.   Do not shave body hair from the neck down 48 hours before surgery.  Contact lenses, hearing aids and dentures may not be worn into surgery.  Do not bring valuables to the hospital. [facility] is not responsible for any missing/lost belongings or valuables.   Notify your doctor if there is any change in your medical condition (cold, fever, infection).  Wear comfortable clothing (specific to your surgery type) to the hospital.  After surgery, you can help prevent lung complications by doing breathing exercises.  Take deep breaths and cough every 1-2 hours.   If you are being  discharged the day of surgery, you will not be allowed to drive home. You will need a responsible individual to drive you home and stay with you for 24 hours after surgery.   If you are taking public transportation, you will need to have a responsible individual with you.  Please call the Pre-admissions Testing Dept. at [phone] if you have any questions about these instructions.  Surgery Visitation Policy:  Patients having surgery or a  procedure may have two visitors.  Children under the age of 44 must have an adult with them who is not the patient.  Merch[employer]iser, Retail to address health-related social needs:  [email or web address]

## 2024-11-24 ENCOUNTER — Other Ambulatory Visit: Payer: Self-pay | Admitting: Pulmonary Disease

## 2024-11-24 DIAGNOSIS — R918 Other nonspecific abnormal finding of lung field: Secondary | ICD-10-CM

## 2024-11-24 DIAGNOSIS — J449 Chronic obstructive pulmonary disease, unspecified: Secondary | ICD-10-CM

## 2024-11-25 ENCOUNTER — Ambulatory Visit
Admission: RE | Admit: 2024-11-25 | Discharge: 2024-11-25 | Disposition: A | Discharge status: Home / Self Care | Source: Ambulatory Visit | Attending: Pulmonary Disease | Admitting: Pulmonary Disease

## 2024-11-25 DIAGNOSIS — R918 Other nonspecific abnormal finding of lung field: Secondary | ICD-10-CM | POA: Insufficient documentation

## 2024-11-25 DIAGNOSIS — J449 Chronic obstructive pulmonary disease, unspecified: Secondary | ICD-10-CM | POA: Insufficient documentation
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
## 2024-11-28 ENCOUNTER — Encounter
Admission: RE | Disposition: A | Discharge status: Home / Self Care | Payer: Self-pay | Source: Ambulatory Visit | Attending: Pulmonary Disease

## 2024-11-28 ENCOUNTER — Encounter: Payer: Self-pay | Admitting: Pulmonary Disease

## 2024-11-28 ENCOUNTER — Ambulatory Visit: Payer: Self-pay

## 2024-11-28 ENCOUNTER — Ambulatory Visit

## 2024-11-28 ENCOUNTER — Ambulatory Visit
Admission: RE | Admit: 2024-11-28 | Discharge: 2024-11-28 | Disposition: A | Discharge status: Home / Self Care | Source: Ambulatory Visit | Attending: Pulmonary Disease | Admitting: Pulmonary Disease

## 2024-11-28 ENCOUNTER — Ambulatory Visit: Payer: Self-pay | Admitting: Urgent Care

## 2024-11-28 ENCOUNTER — Other Ambulatory Visit: Payer: Self-pay

## 2024-11-28 DIAGNOSIS — I110 Hypertensive heart disease with heart failure: Secondary | ICD-10-CM | POA: Insufficient documentation

## 2024-11-28 DIAGNOSIS — Z955 Presence of coronary angioplasty implant and graft: Secondary | ICD-10-CM | POA: Diagnosis not present

## 2024-11-28 DIAGNOSIS — I2510 Atherosclerotic heart disease of native coronary artery without angina pectoris: Secondary | ICD-10-CM | POA: Insufficient documentation

## 2024-11-28 DIAGNOSIS — C3411 Malignant neoplasm of upper lobe, right bronchus or lung: Secondary | ICD-10-CM | POA: Insufficient documentation

## 2024-11-28 DIAGNOSIS — J4489 Other specified chronic obstructive pulmonary disease: Secondary | ICD-10-CM | POA: Insufficient documentation

## 2024-11-28 DIAGNOSIS — R590 Localized enlarged lymph nodes: Secondary | ICD-10-CM | POA: Insufficient documentation

## 2024-11-28 DIAGNOSIS — I5032 Chronic diastolic (congestive) heart failure: Secondary | ICD-10-CM | POA: Insufficient documentation

## 2024-11-28 DIAGNOSIS — I252 Old myocardial infarction: Secondary | ICD-10-CM | POA: Insufficient documentation

## 2024-11-28 DIAGNOSIS — F17220 Nicotine dependence, chewing tobacco, uncomplicated: Secondary | ICD-10-CM | POA: Insufficient documentation

## 2024-11-28 MED ORDER — MIDAZOLAM HCL 2 MG/2ML IJ SOLN
INTRAMUSCULAR | Status: AC
  Filled 2024-11-28: qty 2

## 2024-11-28 MED ORDER — CHLORHEXIDINE GLUCONATE 0.12 % MT SOLN
OROMUCOSAL | Status: AC
  Filled 2024-11-28: qty 15

## 2024-11-28 MED ORDER — ROCURONIUM BROMIDE 10 MG/ML (PF) SYRINGE
PREFILLED_SYRINGE | INTRAVENOUS | Status: AC
  Filled 2024-11-28: qty 10

## 2024-11-28 MED ORDER — PROPOFOL 1000 MG/100ML IV EMUL
INTRAVENOUS | Status: AC
  Filled 2024-11-28: qty 100

## 2024-11-28 MED ORDER — FENTANYL CITRATE (PF) 100 MCG/2ML IJ SOLN: 25.0000 ug | INTRAMUSCULAR | Status: DC | PRN

## 2024-11-28 MED ORDER — LIDOCAINE HCL (CARDIAC) PF 100 MG/5ML IV SOSY
PREFILLED_SYRINGE | INTRAVENOUS | Status: DC | PRN
  Administered 2024-11-28: 100 mg via INTRAVENOUS

## 2024-11-28 MED ORDER — MIDAZOLAM HCL (PF) 2 MG/2ML IJ SOLN
INTRAMUSCULAR | Status: DC | PRN
  Administered 2024-11-28: 2 mg via INTRAVENOUS

## 2024-11-28 MED ORDER — PROPOFOL 500 MG/50ML IV EMUL
INTRAVENOUS | Status: DC | PRN
  Administered 2024-11-28: 75 ug/kg/min via INTRAVENOUS

## 2024-11-28 MED ORDER — FENTANYL CITRATE (PF) 100 MCG/2ML IJ SOLN
INTRAMUSCULAR | Status: DC | PRN
  Administered 2024-11-28: 50 ug via INTRAVENOUS

## 2024-11-28 MED ORDER — SUGAMMADEX SODIUM 200 MG/2ML IV SOLN
INTRAVENOUS | Status: DC | PRN
  Administered 2024-11-28: 200 mg via INTRAVENOUS

## 2024-11-28 MED ORDER — PHENYLEPHRINE 80 MCG/ML (10ML) SYRINGE FOR IV PUSH (FOR BLOOD PRESSURE SUPPORT)
PREFILLED_SYRINGE | INTRAVENOUS | Status: DC | PRN
  Administered 2024-11-28 (×2): 80 ug via INTRAVENOUS

## 2024-11-28 MED ORDER — ORAL CARE MOUTH RINSE: 15.0000 mL | Freq: Once | OROMUCOSAL | Status: AC

## 2024-11-28 MED ORDER — PROPOFOL 10 MG/ML IV BOLUS
INTRAVENOUS | Status: DC | PRN
  Administered 2024-11-28: 150 mg via INTRAVENOUS

## 2024-11-28 MED ORDER — DEXAMETHASONE SOD PHOSPHATE PF 10 MG/ML IJ SOLN
INTRAMUSCULAR | Status: DC | PRN
  Administered 2024-11-28: 10 mg via INTRAVENOUS

## 2024-11-28 MED ORDER — DROPERIDOL 2.5 MG/ML IJ SOLN: 0.6250 mg | Freq: Once | INTRAMUSCULAR | Status: DC | PRN

## 2024-11-28 MED ORDER — FENTANYL CITRATE (PF) 100 MCG/2ML IJ SOLN
INTRAMUSCULAR | Status: AC
  Filled 2024-11-28: qty 2

## 2024-11-28 MED ORDER — PROPOFOL 10 MG/ML IV BOLUS
INTRAVENOUS | Status: AC
  Filled 2024-11-28: qty 20

## 2024-11-28 MED ORDER — ONDANSETRON HCL 4 MG/2ML IJ SOLN
INTRAMUSCULAR | Status: DC | PRN
  Administered 2024-11-28: 4 mg via INTRAVENOUS

## 2024-11-28 MED ORDER — PHENYLEPHRINE HCL-NACL 20-0.9 MG/250ML-% IV SOLN
INTRAVENOUS | Status: DC | PRN
  Administered 2024-11-28: 30 ug/min via INTRAVENOUS

## 2024-11-28 MED ORDER — PHENYLEPHRINE HCL-NACL 20-0.9 MG/250ML-% IV SOLN
INTRAVENOUS | Status: AC
  Filled 2024-11-28: qty 250

## 2024-11-28 MED ORDER — EPHEDRINE SULFATE-NACL 50-0.9 MG/10ML-% IV SOSY
PREFILLED_SYRINGE | INTRAVENOUS | Status: DC | PRN
  Administered 2024-11-28: 10 mg via INTRAVENOUS

## 2024-11-28 MED ORDER — LIDOCAINE HCL (PF) 2 % IJ SOLN
INTRAMUSCULAR | Status: AC
  Filled 2024-11-28: qty 5

## 2024-11-28 MED ORDER — LACTATED RINGERS IV SOLN: INTRAVENOUS | Status: DC

## 2024-11-28 MED ORDER — ROCURONIUM BROMIDE 100 MG/10ML IV SOLN
INTRAVENOUS | Status: DC | PRN
  Administered 2024-11-28: 50 mg via INTRAVENOUS
  Administered 2024-11-28: 10 mg via INTRAVENOUS

## 2024-11-28 MED ORDER — CHLORHEXIDINE GLUCONATE 0.12 % MT SOLN
15.0000 mL | Freq: Once | OROMUCOSAL | Status: AC
  Administered 2024-11-28: 15 mL via OROMUCOSAL

## 2024-11-28 NOTE — Transfer of Care (Signed)
 Immediate Anesthesia Transfer of Care Note  Patient: Joe Dillon  Procedure(s) Performed: BRONCHOSCOPY, VIDEO-ASSISTED BRONCHOSCOPY, WITH EBUS  Patient Location: PACU  Anesthesia Type:General  Level of Consciousness: awake, alert , oriented, and patient cooperative  Airway & Oxygen Therapy: Patient Spontanous Breathing and Patient connected to face mask oxygen  Post-op Assessment: Report given to RN, Post -op Vital signs reviewed and stable, and Patient moving all extremities X 4  Post vital signs: Reviewed and stable  Last Vitals:  Vitals Value Taken Time  BP 130/78 11/28/24 09:43  Temp 36.1 C 11/28/24 09:42  Pulse 72 11/28/24 09:43  Resp 18 11/28/24 09:43  SpO2 100 % 11/28/24 09:43  Vitals shown include unfiled device data.  Last Pain:  Vitals:   11/28/24 0945  PainSc: Asleep         Complications: No notable events documented.
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
## 2024-11-28 NOTE — Anesthesia Procedure Notes (Signed)
 Procedure Name: Intubation Date/Time: 11/28/2024 7:51 AM  Performed by: Myra Lawless, CRNAPre-anesthesia Checklist: Patient identified, Patient being monitored, Timeout performed, Emergency Drugs available and Suction available Patient Re-evaluated:Patient Re-evaluated prior to induction Oxygen Delivery Method: Circle system utilized Preoxygenation: Pre-oxygenation with 100% oxygen Induction Type: IV induction Ventilation: Mask ventilation without difficulty Laryngoscope Size: Mac, 4 and McGrath Grade View: Grade I Tube type: Oral Tube size: 8.5 mm Number of attempts: 1 Airway Equipment and Method: Stylet and Video-laryngoscopy Placement Confirmation: ETT inserted through vocal cords under direct vision, positive ETCO2 and breath sounds checked- equal and bilateral Secured at: 22 cm Tube secured with: Tape Dental Injury: Teeth and Oropharynx as per pre-operative assessment

## 2024-11-28 NOTE — Procedures (Signed)
 ROBOTIC NAVIGATIONAL BRONCHOSCOPY PROCEDURE NOTE 31627  FIBEROPTIC BRONCHOSCOPY WITH BRONCHOALVEOLAR LAVAGE PROCEDURE NOTE 31624  THERAPEUTIC ASPIRATION OF TRACHEOBRONCHIAL TREE 31645  FIDUCIAL MARKER PLACEMENT 68373  ENDOBRONCHIAL ULTRASOUND X >/3 LYMPH NODE PROCEDURE NOTE  31653  TRANSBRONCHIAL FNA X 1 LOBE 31629  TRANSBRONCHIAL SURGICAL LUNG BIOPSY X 1 LOBE 31628  TRANSBRONCHIAL BIOPSY OF SECOND SITE [phone]  RADIAL ENDOBRONCHIAL [id number]  TRANSBRONCHIAL CYTOPATHOLOGY BRUSHINGS 68376    Flexible bronchoscopy was performed  by : Parris MD  assistance by : 1)Repiratory therapist  and 2)cytotech staff and 3) Anesthesia team and 4) Flouroscopy team and 5) IT supporting staff for robotic platform   Indication for the procedure was :  Pre-procedural H&P. The following assessment was performed on the day of the procedure prior to initiating sedation History:  Chest pain n Dyspnea y Hemoptysis n Cough y Fever n Other pertinent items n  Examination Vital signs -reviewed as per nursing documentation today Cardiac    Murmurs: n  Rubs : n  Gallop: n Lungs Wheezing: n Rales : n Rhonchi :y  Other pertinent findings: SOB/hypoxemia due to chronic lung disease   Pre-procedural assessment for Procedural Sedation included: Depth of sedation: As per anesthesia team  ASA Classification:  2 Mallampati airway assessment: 3    Medication list reviewed: y  The patients interval history was taken and revealed: no new complaints The pre- procedure physical examination revealed: No new findings Refer to prior clinic note for details.  Informed Consent: Informed consent was obtained from:  patient after explanation of procedure and risks, benefits, as well as alternative procedures available.  Explanation of level of sedation and possible transfusion was also provided.    Procedural Preparation: Time out was performed and patient was identified by name and birthdate  and procedure to be performed and side for sampling, if any, was specified. Pt was intubated by anesthesia.  The patient was appropriately draped.   Fiberoptic bronchoscopy with airway inspection, therapeutic aspiration of tracheobronchial tree and BAL Procedure findings:  Bronchoscope was inserted via ETT  without difficulty.  Posterior oropharynx, epiglottis, arytenoids, false cords and vocal cords were not visualized as these were bypassed by endotracheal tube. The distal trachea was normal in circumference and appearance without mucosal, cartilaginous or branching abnormalities.  The main carina was mildly splayed . All right and left lobar airways were NOT visualized to the Subsegmental level due to obstructed airways from phlegm and mucus  plugging. Mucus plugging with partial to complete airway obstruction was noted bilaterally and treated with therapeutic aspiration prior to beginning of robotic bronchoscopy to allow adequate visualization and accurate lung biopsy and improve patients respiratory status.   The mucosa was : friable at target segment  Airways were notable for:        exophytic lesions :n       extrinsic compression in the following distributions: n.       Friable mucosa: y       Teacher, music /pigmentation: n    Media Information  Mucus plugging of right lower lobe treated with therapeutic aspiration    Media Information  Mucus plugging   Post procedure Diagnosis:   Mucus plugging of tracheobronchial tree      Navigational Bronchoscopy Procedure Findings:   Ion robotic platform utilized for this procedure. Post appropriate planning and registration peripheral navigation was used to visualize target lesion.   Radial endobronchial US  technology utilized during this procedure for confirmation of lesion.  3D Fluoroscopy utilized for tool in lesion  confirmation   Lesion #1 is proximal Right upper lobe and lesion #2 is peripheral right upper lobe as  illustrated below.        Media Information  RADIAL ULTRASOUND CONFIRMATION OF LESION   TOOL IN LESION CONFIRMATION OF TARGET #2 PERIPHERAL RUL   Target 1 proximal right upper lobe - Transbronchial cytobrushing x 1 ,   Transbronchial FNA x 3,   Transbronchial surgical pathology x 3,    BAL was performed at this location and sent for processing with cytology and microbiology  Due to insufficient tissue via FNA a surgical lung biopsy via surgical forceps was required for adequate pathology tissue evaluation   Target 2 - peripheral right upper lobe - Transbronchial FNA x 4 , surgical transbronchial biopsy x 4   Post procedure diagnosis:  multiple hypermetabolic nodules of right upper lobe       Endobronchial ultrasound assisted hilar and mediastinal lymph node biopsies procedure findings: The fiberoptic bronchoscope was removed and the EBUS scope was introduced. Examination began to evaluate for pathologically enlarged lymph nodes starting on the LEFT side progressing to the RIGHT side.  All lymph node biopsies performed with 21g needle. Lymph node biopsies were sent in cytolite for all stations.   STATION 10L- 1.9CM BIOPSIED 3 TIMES  STATION 7 - 2.1 CM BIOPSIED 3 TIMES STATION 10R - NOT BIOPSIED  STATION 4R - 1CM BIOPSIED 3 TIMES     Media Information  10L STATION ENLARGED - N3 NODE   Post procedure diagnosis:  HILAR AND MEDISTINAL ADENOPATHY CONSISTENT WITH METASTASIS     Immediate sampling complications included:NONE immediate  Epinephrine ZERO ml was used topically  The bronchoscopy was terminated due to completion of the planned procedure and the bronchoscope was removed.   Total dosage of Lidocaine  was ZERO mg  Estimated Blood loss: EXPECTED < 5cc.  Complications included:  NONE   Preliminary CXR findings :  IN PROCESS  Disposition: HOME WITH FAMILY   Follow up with Dr. Benay Pomeroy in 5 days for result discussion.     Halina Picking MD  [facility]  [facility] Division of Pulmonary & Critical Care Medicine

## 2024-11-30 LAB — CULTURE, BAL-QUANTITATIVE W GRAM STAIN
Culture: NORMAL
Gram Stain: NONE SEEN

## 2024-12-01 ENCOUNTER — Encounter: Payer: Self-pay | Admitting: Pulmonary Disease

## 2024-12-02 ENCOUNTER — Ambulatory Visit: Admission: RE | Admit: 2024-12-02 | Source: Ambulatory Visit | Admitting: Radiation Oncology

## 2024-12-03 LAB — SURGICAL PATHOLOGY

## 2024-12-03 LAB — CYTOLOGY - NON PAP

## 2024-12-09 ENCOUNTER — Inpatient Hospital Stay: Attending: Oncology | Admitting: Oncology

## 2024-12-09 ENCOUNTER — Encounter: Payer: Self-pay | Admitting: Oncology

## 2024-12-09 ENCOUNTER — Encounter: Payer: Self-pay | Admitting: *Deleted

## 2024-12-09 ENCOUNTER — Ambulatory Visit
Admission: RE | Admit: 2024-12-09 | Discharge: 2024-12-09 | Disposition: A | Source: Ambulatory Visit | Attending: Radiation Oncology | Admitting: Radiation Oncology

## 2024-12-09 VITALS — BP 125/70 | HR 75 | Temp 97.3°F | Ht 70.0 in | Wt 202.0 lb

## 2024-12-09 VITALS — BP 125/70 | HR 78 | Wt 202.0 lb

## 2024-12-09 DIAGNOSIS — C3491 Malignant neoplasm of unspecified part of right bronchus or lung: Secondary | ICD-10-CM

## 2024-12-09 NOTE — Progress Notes (Signed)
 Order for [employer], xR, and PDL1 placed on recent lung biopsy sample [phone]. Requisition faxed to pathology. Financial assistance application completed and approved for $0 out of pocket cost.

## 2024-12-09 NOTE — Progress Notes (Signed)
 Patient states questions regarding recent imaging, 1/20 CT scan and 1/23 chest x-ray, and how treatment may proceed or change based on results.

## 2024-12-09 NOTE — Progress Notes (Signed)
 Met with patient during follow up visit with Dr. Lenn and Dr. [name]. All questions answered during visit. Reviewed upcoming appts. Instructed to call with any further questions or needs. Pt verbalized understanding.
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
## 2024-12-25 ENCOUNTER — Ambulatory Visit

## 2025-01-30 ENCOUNTER — Ambulatory Visit

## 2025-02-06 ENCOUNTER — Inpatient Hospital Stay: Admitting: Oncology

## 2025-02-10 ENCOUNTER — Inpatient Hospital Stay: Admitting: Oncology
